# Patient Record
Sex: Male | Born: 1945
Health system: Southern US, Community
[De-identification: ages and names within clinical notes are randomized; demographics above are authoritative.]

## PROBLEM LIST (undated history)

## (undated) DIAGNOSIS — E119 Type 2 diabetes mellitus without complications: Secondary | ICD-10-CM

## (undated) DIAGNOSIS — K635 Polyp of colon: Secondary | ICD-10-CM

## (undated) DIAGNOSIS — K5730 Diverticulosis of large intestine without perforation or abscess without bleeding: Secondary | ICD-10-CM

## (undated) DIAGNOSIS — C4491 Basal cell carcinoma of skin, unspecified: Secondary | ICD-10-CM

## (undated) DIAGNOSIS — T7840XA Allergy, unspecified, initial encounter: Secondary | ICD-10-CM

## (undated) DIAGNOSIS — E785 Hyperlipidemia, unspecified: Secondary | ICD-10-CM

## (undated) DIAGNOSIS — G629 Polyneuropathy, unspecified: Secondary | ICD-10-CM

## (undated) DIAGNOSIS — I10 Essential (primary) hypertension: Secondary | ICD-10-CM

## (undated) HISTORY — DX: Diverticulosis of large intestine without perforation or abscess without bleeding: K57.30

## (undated) HISTORY — PX: APPENDECTOMY: SHX54

## (undated) HISTORY — DX: Polyneuropathy, unspecified: G62.9

## (undated) HISTORY — PX: POLYPECTOMY: SHX149

## (undated) HISTORY — DX: Type 2 diabetes mellitus without complications: E11.9

## (undated) HISTORY — DX: Essential (primary) hypertension: I10

## (undated) HISTORY — DX: Polyp of colon: K63.5

## (undated) HISTORY — DX: Basal cell carcinoma of skin, unspecified: C44.91

## (undated) HISTORY — DX: Hyperlipidemia, unspecified: E78.5

## (undated) HISTORY — DX: Allergy, unspecified, initial encounter: T78.40XA

## (undated) HISTORY — PX: COLONOSCOPY: SHX174

## (undated) NOTE — *Deleted (*Deleted)
Please stop by lab before you go If you have mychart- we will send your results within 3 business days of [place] receiving them.  If you do not have mychart- we will call you about results within 5 business days of [place] receiving them.  *please note we are currently using [facility] which has a longer processing time than [facility] typically so labs may not come back as quickly as in the past *please also note that you will see labs on mychart as soon as they post. I will later go in and write notes on them- will say "notes from Dr. Durene Cal"  Health Maintenance Due  Topic Date Due  . INFLUENZA VACCINE  07/25/2020   Depression screen [facility] 2/9 07/20/2020 06/25/2020 03/02/2020  Decreased Interest 0 0 0  Down, Depressed, Hopeless 0 0 0  PHQ - 2 Score 0 0 0  Altered sleeping 0 0 0  Tired, decreased energy 0 0 0  Change in appetite 0 0 0  Feeling bad or failure about yourself  0 0 0  Trouble concentrating 0 0 0  Moving slowly or fidgety/restless 0 0 0  Suicidal thoughts 0 0 0  PHQ-9 Score 0 0 0  Difficult doing work/chores Not difficult at all Not difficult at all Not difficult at all

---

## 1999-11-04 ENCOUNTER — Ambulatory Visit (HOSPITAL_COMMUNITY): Admission: RE | Admit: 1999-11-04 | Discharge: 1999-11-04 | Payer: Self-pay | Admitting: Gastroenterology

## 1999-11-04 ENCOUNTER — Encounter (INDEPENDENT_AMBULATORY_CARE_PROVIDER_SITE_OTHER): Payer: Self-pay | Admitting: Specialist

## 2001-07-13 ENCOUNTER — Inpatient Hospital Stay (HOSPITAL_COMMUNITY): Admission: EM | Admit: 2001-07-13 | Discharge: 2001-07-14 | Payer: Self-pay | Admitting: Emergency Medicine

## 2001-07-13 ENCOUNTER — Encounter: Payer: Self-pay | Admitting: Emergency Medicine

## 2001-07-13 ENCOUNTER — Encounter (INDEPENDENT_AMBULATORY_CARE_PROVIDER_SITE_OTHER): Payer: Self-pay

## 2002-01-15 ENCOUNTER — Encounter: Payer: Self-pay | Admitting: Internal Medicine

## 2002-02-11 ENCOUNTER — Ambulatory Visit (HOSPITAL_BASED_OUTPATIENT_CLINIC_OR_DEPARTMENT_OTHER): Admission: RE | Admit: 2002-02-11 | Discharge: 2002-02-11 | Payer: Self-pay | Admitting: Internal Medicine

## 2006-04-16 ENCOUNTER — Ambulatory Visit: Payer: Self-pay | Admitting: Internal Medicine

## 2006-04-26 ENCOUNTER — Ambulatory Visit: Payer: Self-pay | Admitting: Internal Medicine

## 2006-05-17 ENCOUNTER — Ambulatory Visit: Payer: Self-pay | Admitting: Internal Medicine

## 2006-05-31 ENCOUNTER — Ambulatory Visit: Payer: Self-pay | Admitting: Internal Medicine

## 2006-05-31 ENCOUNTER — Encounter: Payer: Self-pay | Admitting: Internal Medicine

## 2007-03-19 ENCOUNTER — Ambulatory Visit: Payer: Self-pay | Admitting: Internal Medicine

## 2007-04-23 ENCOUNTER — Ambulatory Visit: Payer: Self-pay | Admitting: Internal Medicine

## 2007-04-23 LAB — CONVERTED CEMR LAB
BUN: 21 mg/dL (ref 6–23)
CO2: 27 meq/L (ref 19–32)
Calcium: 9.2 mg/dL (ref 8.4–10.5)
Chloride: 105 meq/L (ref 96–112)
Cholesterol: 201 mg/dL (ref 0–200)
Creatinine, Ser: 1 mg/dL (ref 0.4–1.5)
Direct LDL: 127.7 mg/dL
Folate: 18 ng/mL
GFR calc Af Amer: 98 mL/min
GFR calc non Af Amer: 81 mL/min
Glucose, Bld: 105 mg/dL — ABNORMAL HIGH (ref 70–99)
HDL: 33.1 mg/dL — ABNORMAL LOW (ref >39.0)
Potassium: 4.1 meq/L (ref 3.5–5.1)
Sodium: 141 meq/L (ref 135–145)
TSH: 1.99 microintl units/mL (ref 0.35–5.50)
Total CHOL/HDL Ratio: 6.1
Triglycerides: 224 mg/dL (ref 0–149)
VLDL: 45 mg/dL — ABNORMAL HIGH (ref 0–40)

## 2007-05-14 ENCOUNTER — Ambulatory Visit: Payer: Self-pay | Admitting: Internal Medicine

## 2007-06-21 DIAGNOSIS — E785 Hyperlipidemia, unspecified: Secondary | ICD-10-CM | POA: Insufficient documentation

## 2007-06-21 DIAGNOSIS — Z86010 Personal history of colonic polyps: Secondary | ICD-10-CM | POA: Insufficient documentation

## 2007-06-21 DIAGNOSIS — E1169 Type 2 diabetes mellitus with other specified complication: Secondary | ICD-10-CM | POA: Insufficient documentation

## 2007-07-09 ENCOUNTER — Encounter: Payer: Self-pay | Admitting: Internal Medicine

## 2007-09-11 ENCOUNTER — Encounter: Payer: Self-pay | Admitting: Internal Medicine

## 2008-03-25 ENCOUNTER — Telehealth: Payer: Self-pay | Admitting: Internal Medicine

## 2008-03-26 ENCOUNTER — Ambulatory Visit: Payer: Self-pay | Admitting: Internal Medicine

## 2008-03-26 DIAGNOSIS — I10 Essential (primary) hypertension: Secondary | ICD-10-CM | POA: Insufficient documentation

## 2008-04-15 ENCOUNTER — Ambulatory Visit: Payer: Self-pay | Admitting: Internal Medicine

## 2008-05-20 ENCOUNTER — Ambulatory Visit: Payer: Self-pay | Admitting: Internal Medicine

## 2008-05-20 LAB — CONVERTED CEMR LAB
ALT: 62 units/L — ABNORMAL HIGH (ref 0–53)
AST: 44 units/L — ABNORMAL HIGH (ref 0–37)
Albumin: 4.2 g/dL (ref 3.5–5.2)
Alkaline Phosphatase: 64 units/L (ref 39–117)
BUN: 25 mg/dL — ABNORMAL HIGH (ref 6–23)
Basophils Absolute: 0 10*3/uL (ref 0.0–0.1)
Basophils Relative: 0.3 % (ref 0.0–1.0)
Bilirubin, Direct: 0.1 mg/dL (ref 0.0–0.3)
CO2: 27 meq/L (ref 19–32)
Calcium: 9.5 mg/dL (ref 8.4–10.5)
Chloride: 103 meq/L (ref 96–112)
Cholesterol: 227 mg/dL (ref 0–200)
Creatinine, Ser: 1.1 mg/dL (ref 0.4–1.5)
Direct LDL: 139.8 mg/dL
Eosinophils Absolute: 0.1 10*3/uL (ref 0.0–0.7)
Eosinophils Relative: 2.8 % (ref 0.0–5.0)
GFR calc Af Amer: 88 mL/min
GFR calc non Af Amer: 72 mL/min
Glucose, Bld: 101 mg/dL — ABNORMAL HIGH (ref 70–99)
HCT: 45.7 % (ref 39.0–52.0)
HDL: 28.8 mg/dL — ABNORMAL LOW (ref >39.0)
Hemoglobin: 15.3 g/dL (ref 13.0–17.0)
Lymphocytes Relative: 26.2 % (ref 12.0–46.0)
MCHC: 33.5 g/dL (ref 30.0–36.0)
MCV: 92.8 fL (ref 78.0–100.0)
Monocytes Absolute: 0.5 10*3/uL (ref 0.1–1.0)
Monocytes Relative: 10.3 % (ref 3.0–12.0)
Neutro Abs: 3.2 10*3/uL (ref 1.4–7.7)
Neutrophils Relative %: 60.4 % (ref 43.0–77.0)
PSA: 1.61 ng/mL (ref 0.10–4.00)
Platelets: 189 10*3/uL (ref 150–400)
Potassium: 3.7 meq/L (ref 3.5–5.1)
RBC: 4.93 M/uL (ref 4.22–5.81)
RDW: 12.9 % (ref 11.5–14.6)
Sodium: 140 meq/L (ref 135–145)
TSH: 2.25 microintl units/mL (ref 0.35–5.50)
Total Bilirubin: 1 mg/dL (ref 0.3–1.2)
Total CHOL/HDL Ratio: 7.9
Total Protein: 7.1 g/dL (ref 6.0–8.3)
Triglycerides: 245 mg/dL (ref 0–149)
VLDL: 49 mg/dL — ABNORMAL HIGH (ref 0–40)
WBC: 5.1 10*3/uL (ref 4.5–10.5)

## 2008-05-26 ENCOUNTER — Ambulatory Visit: Payer: Self-pay | Admitting: Internal Medicine

## 2008-05-26 DIAGNOSIS — K5730 Diverticulosis of large intestine without perforation or abscess without bleeding: Secondary | ICD-10-CM | POA: Insufficient documentation

## 2008-05-26 HISTORY — DX: Diverticulosis of large intestine without perforation or abscess without bleeding: K57.30

## 2008-07-27 ENCOUNTER — Ambulatory Visit: Payer: Self-pay | Admitting: Internal Medicine

## 2009-01-27 ENCOUNTER — Ambulatory Visit: Payer: Self-pay | Admitting: Internal Medicine

## 2009-01-27 DIAGNOSIS — G609 Hereditary and idiopathic neuropathy, unspecified: Secondary | ICD-10-CM | POA: Insufficient documentation

## 2009-01-27 DIAGNOSIS — R740 Nonspecific elevation of levels of transaminase and lactic acid dehydrogenase [LDH]: Secondary | ICD-10-CM

## 2009-01-27 DIAGNOSIS — R7401 Elevation of levels of liver transaminase levels: Secondary | ICD-10-CM | POA: Insufficient documentation

## 2009-01-27 LAB — CONVERTED CEMR LAB
HCV Ab: NEGATIVE
Hep B S Ab: NEGATIVE
Hepatitis B Surface Ag: NEGATIVE

## 2009-02-04 LAB — CONVERTED CEMR LAB
ALT: 69 units/L — ABNORMAL HIGH (ref 0–53)
AST: 46 units/L — ABNORMAL HIGH (ref 0–37)
Albumin: 4 g/dL (ref 3.5–5.2)
Alkaline Phosphatase: 52 units/L (ref 39–117)
BUN: 25 mg/dL — ABNORMAL HIGH (ref 6–23)
Bilirubin, Direct: 0.2 mg/dL (ref 0.0–0.3)
CO2: 30 meq/L (ref 19–32)
Calcium: 9.4 mg/dL (ref 8.4–10.5)
Chloride: 103 meq/L (ref 96–112)
Cholesterol: 193 mg/dL (ref 0–200)
Creatinine, Ser: 1.1 mg/dL (ref 0.4–1.5)
Direct LDL: 124 mg/dL
GFR calc Af Amer: 87 mL/min
GFR calc non Af Amer: 72 mL/min
Glucose, Bld: 127 mg/dL — ABNORMAL HIGH (ref 70–99)
HDL: 30.5 mg/dL — ABNORMAL LOW (ref >39.0)
Potassium: 3.6 meq/L (ref 3.5–5.1)
Sodium: 139 meq/L (ref 135–145)
TSH: 1.7 microintl units/mL (ref 0.35–5.50)
Total Bilirubin: 0.8 mg/dL (ref 0.3–1.2)
Total CHOL/HDL Ratio: 6.3
Total Protein: 6.7 g/dL (ref 6.0–8.3)
Triglycerides: 243 mg/dL (ref 0–149)
VLDL: 49 mg/dL — ABNORMAL HIGH (ref 0–40)

## 2009-07-02 ENCOUNTER — Telehealth: Payer: Self-pay | Admitting: Internal Medicine

## 2009-07-29 ENCOUNTER — Ambulatory Visit: Payer: Self-pay | Admitting: Internal Medicine

## 2009-07-29 LAB — CONVERTED CEMR LAB
ALT: 92 U/L — ABNORMAL HIGH
AST: 62 U/L — ABNORMAL HIGH
Albumin: 4.3 g/dL
Alkaline Phosphatase: 60 U/L
BUN: 28 mg/dL — ABNORMAL HIGH
Basophils Absolute: 0 K/uL
Basophils Relative: 0.2 %
Bilirubin Urine: NEGATIVE
Bilirubin, Direct: 0.2 mg/dL
Blood in Urine, dipstick: NEGATIVE
CO2: 25 meq/L
Calcium: 9.8 mg/dL
Chloride: 103 meq/L
Cholesterol: 201 mg/dL — ABNORMAL HIGH
Creatinine, Ser: 1.2 mg/dL
Direct LDL: 143.9 mg/dL
Eosinophils Absolute: 0.1 K/uL
Eosinophils Relative: 2.6 %
GFR calc non Af Amer: 65.02 mL/min
Glucose, Bld: 118 mg/dL — ABNORMAL HIGH
Glucose, Urine, Semiquant: NEGATIVE
HCT: 44 %
HDL: 31 mg/dL — ABNORMAL LOW
Hemoglobin: 15.2 g/dL
Lymphocytes Relative: 22.2 %
Lymphs Abs: 1.2 K/uL
MCHC: 34.5 g/dL
MCV: 92.6 fL
Monocytes Absolute: 0.6 K/uL
Monocytes Relative: 11.4 %
Neutro Abs: 3.6 K/uL
Neutrophils Relative %: 63.6 %
Nitrite: NEGATIVE
PSA: 1.65 ng/mL
Platelets: 173 K/uL
Potassium: 4.6 meq/L
RBC: 4.75 M/uL
RDW: 12.6 %
Sodium: 141 meq/L
Specific Gravity, Urine: >=1.03
TSH: 2.16 u[IU]/mL
Total Bilirubin: 1.1 mg/dL
Total CHOL/HDL Ratio: 6
Total Protein: 7.3 g/dL
Triglycerides: 192 mg/dL — ABNORMAL HIGH
Urobilinogen, UA: 0.2
VLDL: 38.4 mg/dL
WBC Urine, dipstick: NEGATIVE
WBC: 5.5 10*3/microliter
pH: 6

## 2009-08-05 ENCOUNTER — Ambulatory Visit: Payer: Self-pay | Admitting: Internal Medicine

## 2010-02-01 ENCOUNTER — Telehealth: Payer: Self-pay | Admitting: Internal Medicine

## 2010-02-02 ENCOUNTER — Ambulatory Visit: Payer: Self-pay | Admitting: Internal Medicine

## 2010-02-02 LAB — CONVERTED CEMR LAB
BUN: 21 mg/dL (ref 6–23)
CO2: 27 meq/L (ref 19–32)
Calcium: 9.7 mg/dL (ref 8.4–10.5)
Chloride: 101 meq/L (ref 96–112)
Creatinine, Ser: 1.1 mg/dL (ref 0.4–1.5)
GFR calc non Af Amer: 71.77 mL/min (ref >60)
Glucose, Bld: 119 mg/dL — ABNORMAL HIGH (ref 70–99)
Potassium: 3.8 meq/L (ref 3.5–5.1)
Sodium: 138 meq/L (ref 135–145)

## 2010-02-08 LAB — CONVERTED CEMR LAB

## 2010-02-15 ENCOUNTER — Ambulatory Visit: Payer: Self-pay | Admitting: Internal Medicine

## 2010-04-12 ENCOUNTER — Encounter: Payer: Self-pay | Admitting: Internal Medicine

## 2010-08-01 ENCOUNTER — Telehealth: Payer: Self-pay | Admitting: Internal Medicine

## 2010-08-02 ENCOUNTER — Ambulatory Visit: Payer: Self-pay | Admitting: Internal Medicine

## 2010-08-02 LAB — CONVERTED CEMR LAB
AST: 26 units/L (ref 0–37)
Albumin: 4.3 g/dL (ref 3.5–5.2)
Alkaline Phosphatase: 55 units/L (ref 39–117)
Basophils Relative: 0.6 % (ref 0.0–3.0)
Blood in Urine, dipstick: NEGATIVE
CO2: 26 meq/L (ref 19–32)
Calcium: 9.2 mg/dL (ref 8.4–10.5)
Chloride: 101 meq/L (ref 96–112)
Eosinophils Absolute: 0.1 10*3/uL (ref 0.0–0.7)
Glucose, Bld: 104 mg/dL — ABNORMAL HIGH (ref 70–99)
Glucose, Urine, Semiquant: NEGATIVE
HCT: 43.9 % (ref 39.0–52.0)
HDL: 34.4 mg/dL — ABNORMAL LOW (ref >39.00)
Hemoglobin: 14.9 g/dL (ref 13.0–17.0)
Lymphocytes Relative: 25 % (ref 12.0–46.0)
Lymphs Abs: 1.1 10*3/uL (ref 0.7–4.0)
MCHC: 34 g/dL (ref 30.0–36.0)
Monocytes Relative: 11.3 % (ref 3.0–12.0)
Neutro Abs: 2.7 10*3/uL (ref 1.4–7.7)
Nitrite: NEGATIVE
Potassium: 4 meq/L (ref 3.5–5.1)
RBC: 4.63 M/uL (ref 4.22–5.81)
RDW: 13.4 % (ref 11.5–14.6)
Sodium: 138 meq/L (ref 135–145)
Specific Gravity, Urine: 1.025
TSH: 2.79 microintl units/mL (ref 0.35–5.50)
Total CHOL/HDL Ratio: 6
Total Protein: 6.9 g/dL (ref 6.0–8.3)
WBC Urine, dipstick: NEGATIVE
pH: 5.5

## 2010-08-09 ENCOUNTER — Ambulatory Visit: Payer: Self-pay | Admitting: Internal Medicine

## 2011-01-02 ENCOUNTER — Telehealth: Payer: Self-pay | Admitting: Internal Medicine

## 2011-01-24 NOTE — Assessment & Plan Note (Signed)
Summary: 6 month rov/njr   Vital Signs:  Patient profile:   65 year old male Weight:      218 pounds Temp:     98 degrees F Pulse rate:   72 / minute Resp:     12 per minute BP sitting:   118 / 90  (left arm)  Vitals Entered By: Gladis Riffle, RN (February 15, 2010 9:40 AM) CC: 6 month rov, labs done Is Patient Diabetic? No   CC:  6 month rov and labs done.  History of Present Illness:  Follow-Up Visit      This is a 65 year old man who presents for Follow-up visit.  The patient denies chest pain and palpitations.  Since the last visit the patient notes no new problems or concerns.  The patient reports taking meds as prescribed.  When questioned about possible medication side effects, the patient notes none.    All other systems reviewed and were negative   Preventive Screening-Counseling & Management  Alcohol-Tobacco     Smoking Status: never  Current Problems (verified): 1)  Transaminases, Serum, Elevated  (ICD-790.4) 2)  Peripheral Neuropathy  (ICD-356.9) 3)  Preventive Health Care  (ICD-V70.0) 4)  Diverticulosis, Colon  (ICD-562.10) 5)  Essential Hypertension  (ICD-401.9) 6)  Colonic Polyps, Hx of  (ICD-V12.72) 7)  Hyperlipidemia  (ICD-272.4)  Medications Prior to Update: 1)  Aspir-81 81 Mg Tbec (Aspirin) .... Take 1 Tablet By Mouth Once A Day 2)  Valtrex 1 Gm  Tabs (Valacyclovir Hcl) .... 2 Twice Daily For One Day As Needed 3)  Lisinopril-Hydrochlorothiazide 20-12.5 Mg Tabs (Lisinopril-Hydrochlorothiazide) .Marland Kitchen.. 1 Tablet By Mouth Daily 4)  Lysine Hcl 1000 Mg  Tabs (Lysine Hcl) .... Once Daily 5)  Multivitamins  Tabs (Multiple Vitamin) .... Once Daily  Current Medications (verified): 1)  Aspir-81 81 Mg Tbec (Aspirin) .... Take 1 Tablet By Mouth Once A Day 2)  Valtrex 1 Gm  Tabs (Valacyclovir Hcl) .... 2 Twice Daily For One Day As Needed 3)  Lisinopril-Hydrochlorothiazide 20-12.5 Mg Tabs (Lisinopril-Hydrochlorothiazide) .Marland Kitchen.. 1 Tablet By Mouth Daily 4)  Lysine Hcl  1000 Mg  Tabs (Lysine Hcl) .... Once Daily 5)  Multivitamins  Tabs (Multiple Vitamin) .... Once Daily  Allergies (verified): No Known Drug Allergies  Past History:  Past Medical History: Last updated: 05/26/2008 Hyperlipidemia--refused treatment Colonic polyps, hx of Hypertension Diverticulosis, colon  Past Surgical History: Last updated: 06/21/2007 Appendectomy  Family History: Last updated: 05/26/2008 no known htn Father:  alive, CABG age 68 Mother: deceased, blood clot age 35, unknown cancerin "intestines" Siblings: 2 brothers, 1 sister healthy  Social History: Last updated: 05/26/2008 Occupation: Never Smoked Married Alcohol use-yes 2 healthy children  Risk Factors: Smoking Status: never (02/15/2010)  Physical Exam  General:  alert and well-developed.   Head:  normocephalic and atraumatic.   Eyes:  pupils equal and pupils round.   Ears:  R ear normal and L ear normal.   Neck:  No deformities, masses, or tenderness noted. Lungs:  normal respiratory effort and no intercostal retractions.   Heart:  normal rate and regular rhythm.   Abdomen:  Bowel sounds positive,abdomen soft and non-tender without masses, organomegaly or hernias noted. Msk:  No deformity or scoliosis noted of thoracic or lumbar spine.   Neurologic:  cranial nerves II-XII intact and gait normal.     Impression & Recommendations:  Problem # 1:  ESSENTIAL HYPERTENSION (ICD-401.9) reasonable control continue current medications  His updated medication list for this problem includes:  Lisinopril-hydrochlorothiazide 20-12.5 Mg Tabs (Lisinopril-hydrochlorothiazide) .Marland Kitchen... 1 tablet by mouth daily  Problem # 2:  TRANSAMINASES, SERUM, ELEVATED (ICD-790.4) had been stable will check with next lab draw  Problem # 3:  PERIPHERAL NEUROPATHY (ICD-356.9) stable sxs  Complete Medication List: 1)  Aspir-81 81 Mg Tbec (Aspirin) .... Take 1 tablet by mouth once a day 2)  Valtrex 1 Gm Tabs  (Valacyclovir hcl) .... 2 twice daily for one day as needed 3)  Lisinopril-hydrochlorothiazide 20-12.5 Mg Tabs (Lisinopril-hydrochlorothiazide) .Marland Kitchen.. 1 tablet by mouth daily 4)  Lysine Hcl 1000 Mg Tabs (Lysine hcl) .... Once daily 5)  Multivitamins Tabs (Multiple vitamin) .... Once daily  Patient Instructions: 1)  6 months CPX

## 2011-01-24 NOTE — Letter (Signed)
Summary: [facility]  [facility]   Imported By: Maryln Gottron 04/28/2010 15:24:23  _____________________________________________________________________  External Attachment:    Type:   Image     Comment:   External Document

## 2011-01-24 NOTE — Progress Notes (Signed)
Summary: pt req additional lab work to be added to sch labs on 02/02/10  Phone Note Call from Patient Call back at Work Phone [phone]   Caller: Patient Summary of Call: Pt called and is having lab work done here on 02/02/10. Pt says that his insurance company is requiring that he gets additional labs added to his bloodwork. Pt would like Dr. Cato Mulligan to order the following labs,  Basic Metabolic Profile and HIV study and UA.  Please advise.  Initial call taken by: Lucy Antigua,  February 01, 2010 9:08 AM  Follow-up for Phone Call        ok Follow-up by: Birdie Sons MD,  February 01, 2010 10:34 AM  Additional Follow-up for Phone Call Additional follow up Details #1::        I called pt and added the requested labs as noted abover per Dr. Cato Mulligan.  Additional Follow-up by: Lucy Antigua,  February 01, 2010 11:33 AM

## 2011-01-24 NOTE — Letter (Signed)
Summary: [facility]-page 2 only  [facility]-page 2 only   Imported By: Maryln Gottron 04/19/2010 15:17:18  _____________________________________________________________________  External Attachment:    Type:   Image     Comment:   External Document

## 2011-01-24 NOTE — Progress Notes (Signed)
Summary: Pt req to be tested for b-12 deficiency. Pls add to labs  Phone Note Call from Patient Call back at Home Phone [phone] Call back at Work Phone [phone]   Caller: Patient Summary of Call: Pt is sch to come in for labs on 08/02/10 and is req that he be tested for B-12 Deficiency. Pls add test to labs.      Initial call taken by: Lucy Antigua,  August 01, 2010 9:22 AM  Follow-up for Phone Call        ok Follow-up by: Birdie Sons MD,  August 01, 2010 2:43 PM  Additional Follow-up for Phone Call Additional follow up Details #1::        added to labs for 08/02/10. Additional Follow-up by: Gladis Riffle, RN,  August 01, 2010 3:33 PM

## 2011-01-24 NOTE — Assessment & Plan Note (Signed)
Summary: CPX//CCM   Vital Signs:  Patient profile:   65 year old male Height:      71 inches Weight:      207 pounds BMI:     28.97 Temp:     98.3 degrees F oral Pulse rate:   70 / minute BP sitting:   120 / 80  (left arm) Cuff size:   regular  Vitals Entered By: Kathrynn Speed CMA (August 09, 2010 9:05 AM) CC: cpx, review labs, src Is Patient Diabetic? No   CC:  cpx, review labs, and src.  History of Present Illness: CPX  Preventive Screening-Counseling & Management  Alcohol-Tobacco     Smoking Status: never  Current Problems (verified): 1)  Transaminases, Serum, Elevated  (ICD-790.4) 2)  Peripheral Neuropathy  (ICD-356.9) 3)  Preventive Health Care  (ICD-V70.0) 4)  Diverticulosis, Colon  (ICD-562.10) 5)  Essential Hypertension  (ICD-401.9) 6)  Colonic Polyps, Hx of  (ICD-V12.72) 7)  Hyperlipidemia  (ICD-272.4)  Current Medications (verified): 1)  Aspir-81 81 Mg Tbec (Aspirin) .... Take 1 Tablet By Mouth Once A Day 2)  Valtrex 1 Gm  Tabs (Valacyclovir Hcl) .... 2 Twice Daily For One Day As Needed 3)  Lisinopril-Hydrochlorothiazide 20-12.5 Mg Tabs (Lisinopril-Hydrochlorothiazide) .Marland Kitchen.. 1 Tablet By Mouth Daily 4)  Lysine Hcl 1000 Mg  Tabs (Lysine Hcl) .... Once Daily 5)  Multivitamins  Tabs (Multiple Vitamin) .... Once Daily 6)  Vitamin D  5000 Mg-Unit Tabs (Calcium Carbonate-Vitamin D) .... Once Daily 7)  Alpha-Lipoic Acid 600 Mg Caps (Alpha-Lipoic Acid) .... Once Day  Allergies (verified): No Known Drug Allergies  Past History:  Past Medical History: Last updated: 05/26/2008 Hyperlipidemia--refused treatment Colonic polyps, hx of Hypertension Diverticulosis, colon  Past Surgical History: Last updated: 06/21/2007 Appendectomy  Family History: Last updated: 05/26/2008 no known htn Father:  alive, CABG age 50 Mother: deceased, blood clot age 71, unknown cancerin "intestines" Siblings: 2 brothers, 1 sister healthy  Social History: Last updated:  05/26/2008 Occupation: Never Smoked Married Alcohol use-yes 2 healthy children  Risk Factors: Smoking Status: never (08/09/2010)  Family History: Reviewed history from 05/26/2008 and no changes required. no known htn Father:  alive, CABG age 74 Mother: deceased, blood clot age 19, unknown cancerin "intestines" Siblings: 2 brothers, 1 sister healthy  Social History: Reviewed history from 05/26/2008 and no changes required. Occupation: Never Smoked Married Alcohol use-yes 2 healthy children  Physical Exam  General:  alert and well-developed.   Head:  normocephalic and atraumatic.   Eyes:  pupils equal and pupils round.   Ears:  R ear normal and L ear normal.   Nose:  no external deformity and no external erythema.   Neck:  No deformities, masses, or tenderness noted. Chest Wall:  No deformities, masses, tenderness or gynecomastia noted. Lungs:  normal respiratory effort and no intercostal retractions.   Heart:  normal rate and regular rhythm.   Abdomen:  Bowel sounds positive,abdomen soft and non-tender without masses, organomegaly or hernias noted. Rectal:  no hemorrhoids and no masses.   Prostate:  no gland enlargement and no asymmetry.   Msk:  No deformity or scoliosis noted of thoracic or lumbar spine.   Pulses:  R radial normal and L radial normal.   Neurologic:  cranial nerves II-XII intact and gait normal.   Skin:  turgor normal and color normal.   Psych:  good eye contact and not anxious appearing.     Impression & Recommendations:  Problem # 1:  PREVENTIVE HEALTH CARE (ICD-V70.0) health  maint utd advised regular exercise  Problem # 2:  ESSENTIAL HYPERTENSION (ICD-401.9) controlled continue current medications  His updated medication list for this problem includes:    Lisinopril-hydrochlorothiazide 20-12.5 Mg Tabs (Lisinopril-hydrochlorothiazide) .Marland Kitchen... 1 tablet by mouth daily  BP today: 120/80 Prior BP: 118/90 (02/15/2010)  Labs Reviewed: K+: 4.0  (08/02/2010) Creat: : 1.1 (08/02/2010)   Chol: 195 (08/02/2010)   HDL: 34.40 (08/02/2010)   LDL: 127 (08/02/2010)   TG: 169.0 (08/02/2010)  Problem # 3:  HYPERLIPIDEMIA (ICD-272.4) controlled and not on any medications Labs Reviewed: SGOT: 26 (08/02/2010)   SGPT: 35 (08/02/2010)   HDL:34.40 (08/02/2010), 31.00 (07/29/2009)  LDL:127 (08/02/2010), DEL (01/27/2009)  Chol:195 (08/02/2010), 201 (07/29/2009)  Trig:169.0 (08/02/2010), 192.0 (07/29/2009)  Complete Medication List: 1)  Aspir-81 81 Mg Tbec (Aspirin) .... Take 1 tablet by mouth once a day 2)  Valtrex 1 Gm Tabs (Valacyclovir hcl) .... 2 twice daily for one day as needed 3)  Lisinopril-hydrochlorothiazide 20-12.5 Mg Tabs (Lisinopril-hydrochlorothiazide) .Marland Kitchen.. 1 tablet by mouth daily 4)  Lysine Hcl 1000 Mg Tabs (Lysine hcl) .... Once daily 5)  Multivitamins Tabs (Multiple vitamin) .... Once daily 6)  Vitamin D 5000 Mg-unit Tabs (calcium Carbonate-vitamin D)  .... Once daily 7)  Alpha-lipoic Acid 600 Mg Caps (Alpha-lipoic acid) .... Once day Prescriptions: LISINOPRIL-HYDROCHLOROTHIAZIDE 20-12.5 MG TABS (LISINOPRIL-HYDROCHLOROTHIAZIDE) 1 tablet by mouth daily  #90 Each x 3   Entered and Authorized by:   Birdie Sons MD   Signed by:   Birdie Sons MD on 08/09/2010   Method used:   Electronically to        [facility]  [phone]* (retail)       [address]       [place], [place]  [address]       Ph: [phone] or [phone]       Fax: [phone]   RxID:   [id number]     Preventive Care Screening  Last Tetanus Booster:    Date:  05/26/2008    Results:  Td

## 2011-01-26 NOTE — Progress Notes (Signed)
Summary: Pt req refill of Valtrex to [facility] on [address]  Phone Note Refill Request Call back at Work Phone [phone] Message from:  Patient on January 02, 2011 1:19 PM  Refills Requested: Medication #1:  VALTREX 1 GM  TABS 2 twice daily for one day as needed   Dosage confirmed as above?Dosage Confirmed  Method Requested: Telephone to [facility] on [address] Initial call taken by: Lucy Antigua,  January 02, 2011 1:18 PM  Follow-up for Phone Call        Rx called to pharmacy Follow-up by: Alfred Levins, CMA,  January 02, 2011 4:22 PM    Prescriptions: VALTREX 1 GM  TABS (VALACYCLOVIR HCL) 2 twice daily for one day as needed  #20 x 3   Entered by:   Alfred Levins, CMA   Authorized by:   Birdie Sons MD   Signed by:   Alfred Levins, CMA on 01/02/2011   Method used:   Electronically to        [facility]  [phone]* (retail)       [address]       [place], [place]  [address]       Ph: [phone] or [phone]       Fax: [phone]   RxID:   [id number]

## 2011-05-12 NOTE — Op Note (Signed)
[facility]  Patient:    William Brewer, William Brewer                      MRN: [id number] Proc. Date: 07/13/01 Adm. Date:  [id number] Disc. Date: [id number] Attending:  Vikki Ports.                           Operative Report  PREOPERATIVE DIAGNOSIS:  Acute appendicitis.  POSTOPERATIVE DIAGNOSIS:  Acute appendicitis.  PROCEDURE:  Laparoscopic appendectomy.  ANESTHESIA:  General.  SURGEON:  Vikki Ports, M.D.  ASSISTANT:  Currie Paris, M.D.  DESCRIPTION OF PROCEDURE:  The patient was taken to the operating room, placed in supine position, and after adequate anesthesia was induced using an endotracheal tube, the abdomen was prepped and draped in normal sterile fashion. Using a transverse infraumbilical incision, I dissected down the fascia. This was opened vertically. An #0 Vicryl pursestring suture was placed around the fascial defect and a Hasson trocar was placed in the abdomen. The abdomen was insufflated to 15 mmHg with carbon dioxide. Under direct visualization, a blunt 11 mm trocar was placed through a stab wound in the left lower quadrant. A separate 5 mm trocar was placed through a separate stab wound in the right upper quadrant. The gallbladder was identified and appeared acutely inflamed with minimal fluid adjacent to the appendix. The appendix was grasped and retracted anteriorly. The mesoappendix was taken down using the harmonic scalpel. The base of the appendix was divided using the GIA stapling device. The appendix was placed in the endocatch bag and removed through the umbilical port. Trocars were removed under direct visualization after the right lower quadrant had been copiously irrigated. The incisions were injected using Marcaine, the infraumbilical fascial defect was closed with the #0 Vicryl pursestring suture. The skin incisions were closed with subcuticular 4-0 Vicryl. Steri-Strips and sterile dressings were  applied. The patient tolerated the procedure well and went to PACU in good condition. DD:  07/13/01 TD:  07/15/01 Job: [id number]

## 2011-07-18 ENCOUNTER — Encounter: Payer: Self-pay | Admitting: Internal Medicine

## 2011-07-18 ENCOUNTER — Other Ambulatory Visit (INDEPENDENT_AMBULATORY_CARE_PROVIDER_SITE_OTHER): Payer: BC Managed Care – PPO

## 2011-07-18 DIAGNOSIS — Z0000 Encounter for general adult medical examination without abnormal findings: Secondary | ICD-10-CM

## 2011-07-18 LAB — BASIC METABOLIC PANEL
BUN: 28 mg/dL — ABNORMAL HIGH (ref 6–23)
CO2: 27 mEq/L (ref 19–32)
Chloride: 107 mEq/L (ref 96–112)
Creatinine, Ser: 1 mg/dL (ref 0.4–1.5)
Glucose, Bld: 112 mg/dL — ABNORMAL HIGH (ref 70–99)

## 2011-07-18 LAB — HEPATIC FUNCTION PANEL
ALT: 34 U/L (ref 0–53)
AST: 24 U/L (ref 0–37)
Albumin: 4.4 g/dL (ref 3.5–5.2)
Alkaline Phosphatase: 63 U/L (ref 39–117)

## 2011-07-18 LAB — CBC WITH DIFFERENTIAL/PLATELET
Basophils Absolute: 0 10*3/uL (ref 0.0–0.1)
Eosinophils Absolute: 0.1 10*3/uL (ref 0.0–0.7)
Lymphs Abs: 1.1 10*3/uL (ref 0.7–4.0)
MCHC: 34.2 g/dL (ref 30.0–36.0)
MCV: 94.2 fl (ref 78.0–100.0)
Monocytes Absolute: 0.5 10*3/uL (ref 0.1–1.0)
Neutrophils Relative %: 62.3 % (ref 43.0–77.0)
Platelets: 172 10*3/uL (ref 150.0–400.0)
RDW: 13.4 % (ref 11.5–14.6)
WBC: 4.7 10*3/uL (ref 4.5–10.5)

## 2011-07-18 LAB — LIPID PANEL
Cholesterol: 196 mg/dL (ref 0–200)
Triglycerides: 201 mg/dL — ABNORMAL HIGH (ref 0.0–149.0)

## 2011-07-18 LAB — POCT URINALYSIS DIPSTICK
Bilirubin, UA: NEGATIVE
Blood, UA: NEGATIVE
Glucose, UA: NEGATIVE
Nitrite, UA: NEGATIVE
Spec Grav, UA: 1.03
Urobilinogen, UA: 0.2

## 2011-07-18 LAB — TSH: TSH: 2.29 u[IU]/mL (ref 0.35–5.50)

## 2011-07-18 LAB — LDL CHOLESTEROL, DIRECT: Direct LDL: 117.1 mg/dL

## 2011-07-24 ENCOUNTER — Other Ambulatory Visit: Payer: Self-pay | Admitting: Internal Medicine

## 2011-07-25 ENCOUNTER — Encounter: Payer: Self-pay | Admitting: Internal Medicine

## 2011-07-28 ENCOUNTER — Ambulatory Visit (INDEPENDENT_AMBULATORY_CARE_PROVIDER_SITE_OTHER): Payer: BC Managed Care – PPO | Admitting: Internal Medicine

## 2011-07-28 ENCOUNTER — Encounter: Payer: Self-pay | Admitting: Internal Medicine

## 2011-07-28 DIAGNOSIS — I10 Essential (primary) hypertension: Secondary | ICD-10-CM

## 2011-07-28 DIAGNOSIS — G609 Hereditary and idiopathic neuropathy, unspecified: Secondary | ICD-10-CM

## 2011-07-28 DIAGNOSIS — Z0000 Encounter for general adult medical examination without abnormal findings: Secondary | ICD-10-CM

## 2011-07-28 NOTE — Assessment & Plan Note (Signed)
Controlled Continue meds 

## 2011-07-28 NOTE — Progress Notes (Signed)
  Subjective:    Patient ID: William Brewer, male    DOB: 1946-04-23, 65 y.o.   MRN: [id number]  HPI  cpx   Past Medical History  Diagnosis Date  . Colon polyps   . Hyperlipidemia     refused treatment  . Hypertension   . Diverticulosis of colon    Past Surgical History  Procedure Date  . Appendectomy     reports that he has never smoked. He does not have any smokeless tobacco history on file. He reports that he drinks alcohol. His drug history not on file. family history includes Cancer in his mother; Deep vein thrombosis in his mother; and Heart disease (age of onset:68) in his father.  There is no history of Hypertension. No Known Allergies   Review of Systems  patient denies chest pain, shortness of breath, orthopnea. Denies lower extremity edema, abdominal pain, change in appetite, change in bowel movements. Patient denies rashes, musculoskeletal complaints. No other specific complaints in a complete review of systems.      Objective:   Physical Exam Well-developed male in no acute distress. HEENT exam atraumatic, normocephalic, extraocular muscles are intact. Conjunctivae are pink without exudate. Neck is supple without lymphadenopathy, thyromegaly, jugular venous distention. Chest is clear to auscultation without increased work of breathing. Cardiac exam S1-S2 are regular. The PMI is normal. No significant murmurs or gallops. Abdominal exam active bowel sounds, soft, nontender. No abdominal bruits. Extremities no clubbing cyanosis or edema. Peripheral pulses are normal without bruits. Neurologic exam alert and oriented without any motor or sensory deficits. Rectal exam normal tone prostate normal size without masses or asymmetry.     Assessment & Plan:  Well visit- health maint UTD

## 2011-07-28 NOTE — Assessment & Plan Note (Signed)
progressiveneuropathy---no treatment indicated

## 2011-09-15 ENCOUNTER — Telehealth: Payer: Self-pay | Admitting: Internal Medicine

## 2011-09-15 DIAGNOSIS — G629 Polyneuropathy, unspecified: Secondary | ICD-10-CM

## 2011-09-15 NOTE — Telephone Encounter (Signed)
Pt called and said that he needs to get a referral to see Dr Lesia Sago with [facility], because pt last seen there in 2008. Peripheral Neuropathy

## 2011-09-18 NOTE — Telephone Encounter (Signed)
Referral entered  

## 2012-02-09 ENCOUNTER — Other Ambulatory Visit: Payer: Self-pay | Admitting: Internal Medicine

## 2012-04-17 ENCOUNTER — Encounter: Payer: Self-pay | Admitting: Family

## 2012-04-17 ENCOUNTER — Telehealth: Payer: Self-pay | Admitting: Internal Medicine

## 2012-04-17 ENCOUNTER — Ambulatory Visit (INDEPENDENT_AMBULATORY_CARE_PROVIDER_SITE_OTHER): Payer: BC Managed Care – PPO | Admitting: Family

## 2012-04-17 VITALS — BP 138/80 | Temp 97.7°F | Wt 213.0 lb

## 2012-04-17 DIAGNOSIS — M79643 Pain in unspecified hand: Secondary | ICD-10-CM

## 2012-04-17 DIAGNOSIS — IMO0002 Reserved for concepts with insufficient information to code with codable children: Secondary | ICD-10-CM

## 2012-04-17 DIAGNOSIS — M79609 Pain in unspecified limb: Secondary | ICD-10-CM

## 2012-04-17 MED ORDER — DOXYCYCLINE HYCLATE 100 MG PO TABS: 100.0000 mg | ORAL_TABLET | Freq: Two times a day (BID) | ORAL | Status: AC

## 2012-04-17 MED ORDER — DOXYCYCLINE HYCLATE 100 MG PO TABS: 100.0000 mg | ORAL_TABLET | Freq: Two times a day (BID) | ORAL | Status: DC

## 2012-04-17 NOTE — Telephone Encounter (Signed)
Pt called to check on status of getting doxycycline (VIBRA-TABS) 100 MG tablet  called in to [facility] on [address]. Pt went to pharmacy and nothing has been rcvd yet.

## 2012-04-17 NOTE — Progress Notes (Signed)
Subjective:    Patient ID: William Brewer, male    DOB: July 10, 1946, 66 y.o.   MRN: [id number]  HPI Comments: 66 yo white male presents with c/o right index finger infection getting progressively worse x several days. Denies injury or trauma. C/o tissue surrounding nail bed bright red and tender to touch. Pain 7/10 described as aching/throbbing somewhat relieved with OTC aleve, 4/10. Denies fever, chills, or drainage to the area.   Hand Pain       Review of Systems  Constitutional: Negative.   Respiratory: Negative.   Cardiovascular: Negative.   Skin: Positive for color change. Negative for pallor, rash and wound.       See HPI   Past Medical History  Diagnosis Date  . Colon polyps   . Hyperlipidemia     refused treatment  . Hypertension   . Diverticulosis of colon     History   Social History  . Marital Status: Married    Spouse Name: N/A    Number of Children: N/A  . Years of Education: N/A   Occupational History  . Not on file.   Social History Main Topics  . Smoking status: Never Smoker   . Smokeless tobacco: Not on file  . Alcohol Use: Yes  . Drug Use:   . Sexually Active:    Other Topics Concern  . Not on file   Social History Narrative  . No narrative on file    Past Surgical History  Procedure Date  . Appendectomy     Family History  Problem Relation Age of Onset  . Deep vein thrombosis Mother   . Cancer Mother     intestine  . Heart disease Father 53    CABG  . Hypertension Neg Hx     No Known Allergies  Current Outpatient Prescriptions on File Prior to Visit  Medication Sig Dispense Refill  . ALPHA LIPOIC ACID PO Take 1 tablet by mouth daily.        Marland Kitchen aspirin 81 MG tablet Take 81 mg by mouth daily.        . Cholecalciferol (VITAMIN D) 2000 UNITS tablet Take 2,000 Units by mouth daily.        Marland Kitchen lisinopril-hydrochlorothiazide (PRINZIDE,ZESTORETIC) 20-12.5 MG per tablet TAKE ONE TABLET BY MOUTH EVERY DAY  90 tablet  1  . Lysine HCl  1000 MG TABS Take 1 tablet by mouth daily.        . Multiple Vitamin (MULTIVITAMIN) tablet Take 1 tablet by mouth daily.        . valACYclovir (VALTREX) 1000 MG tablet Take 1,000 mg by mouth 2 (two) times daily.          BP 138/80  Temp(Src) 97.7 F (36.5 C) (Oral)  Wt 213 lb (96.616 kg)chart     Objective:   Physical Exam  Constitutional: He is oriented to person, place, and time. He appears well-developed and well-nourished. No distress.  Cardiovascular: Normal rate, regular rhythm, normal heart sounds and intact distal pulses.  Exam reveals no gallop and no friction rub.   No murmur heard. Pulmonary/Chest: Effort normal and breath sounds normal. No respiratory distress. He has no wheezes. He has no rales. He exhibits no tenderness.  Neurological: He is alert and oriented to person, place, and time.  Skin: Skin is warm and dry. No rash noted. He is not diaphoretic. There is erythema. No pallor.       Right distal metacarpal tissue bright red surrounding nailbed and  tender to touch          Assessment & Plan:  Assessment: Paronchyia Plan: Doxycycline, RTC if gets worse. Teaching handout provided on diagnosis and treatment. Opportunity for questions provided.

## 2012-04-17 NOTE — Telephone Encounter (Signed)
Rx resent.

## 2012-04-17 NOTE — Patient Instructions (Signed)
Paronychia  Paronychia is an inflammatory reaction involving the folds of the skin surrounding the fingernail. This is commonly caused by an infection in the skin around a nail. The most common cause of paronychia is frequent wetting of the hands (as seen with bartenders, food servers, nurses or others who wet their hands). This makes the skin around the fingernail susceptible to infection by bacteria (germs) or fungus. Other predisposing factors are:   Aggressive manicuring.   Nail biting.   Thumb sucking.  The most common cause is a staphylococcal (a type of germ) infection, or a fungal (Candida) infection. When caused by a germ, it usually comes on suddenly with redness, swelling, pus and is often painful. It may get under the nail and form an abscess (collection of pus), or form an abscess around the nail. If the nail itself is infected with a fungus, the treatment is usually prolonged and may require oral medicine for up to one year. Your caregiver will determine the length of time treatment is required. The paronychia caused by bacteria (germs) may largely be avoided by not pulling on hangnails or picking at cuticles. When the infection occurs at the tips of the finger it is called felon. When the cause of paronychia is from the herpes simplex virus (HSV) it is called herpetic whitlow.  TREATMENT   When an abscess is present treatment is often incision and drainage. This means that the abscess must be cut open so the pus can get out. When this is done, the following home care instructions should be followed.  HOME CARE INSTRUCTIONS    It is important to keep the affected fingers very dry. Rubber or plastic gloves over cotton gloves should be used whenever the hand must be placed in water.   Keep wound clean, dry and dressed as suggested by your caregiver between warm soaks or warm compresses.   Soak in warm water for fifteen to twenty minutes three to four times per day for bacterial infections. Fungal  infections are very difficult to treat, so often require treatment for long periods of time.   For bacterial (germ) infections take antibiotics (medicine which kill germs) as directed and finish the prescription, even if the problem appears to be solved before the medicine is gone.   Only take over-the-counter or prescription medicines for pain, discomfort, or fever as directed by your caregiver.  SEEK IMMEDIATE MEDICAL CARE IF:   You have redness, swelling, or increasing pain in the wound.   You notice pus coming from the wound.   You have a fever.   You notice a bad smell coming from the wound or dressing.  Document Released: 06/06/2001 Document Revised: 11/30/2011 Document Reviewed: 02/05/2009  ExitCare Patient Information 2012 [employer].

## 2012-04-30 ENCOUNTER — Other Ambulatory Visit: Payer: Self-pay | Admitting: Internal Medicine

## 2012-08-08 ENCOUNTER — Other Ambulatory Visit: Payer: Self-pay | Admitting: Internal Medicine

## 2012-08-22 ENCOUNTER — Other Ambulatory Visit: Payer: BC Managed Care – PPO

## 2012-09-06 ENCOUNTER — Encounter: Payer: BC Managed Care – PPO | Admitting: Internal Medicine

## 2012-09-13 ENCOUNTER — Other Ambulatory Visit (INDEPENDENT_AMBULATORY_CARE_PROVIDER_SITE_OTHER): Payer: BC Managed Care – PPO

## 2012-09-13 DIAGNOSIS — Z0000 Encounter for general adult medical examination without abnormal findings: Secondary | ICD-10-CM

## 2012-09-13 LAB — POCT URINALYSIS DIPSTICK
Blood, UA: NEGATIVE
Nitrite, UA: NEGATIVE
Protein, UA: NEGATIVE
Urobilinogen, UA: 0.2
pH, UA: 6.5

## 2012-09-13 LAB — LIPID PANEL
HDL: 34.1 mg/dL — ABNORMAL LOW (ref >39.00)
Total CHOL/HDL Ratio: 6
Triglycerides: 221 mg/dL — ABNORMAL HIGH (ref 0.0–149.0)

## 2012-09-13 LAB — HEPATIC FUNCTION PANEL
Albumin: 4.3 g/dL (ref 3.5–5.2)
Total Protein: 7.1 g/dL (ref 6.0–8.3)

## 2012-09-13 LAB — PSA: PSA: 1.79 ng/mL (ref 0.10–4.00)

## 2012-09-13 LAB — CBC WITH DIFFERENTIAL/PLATELET
Basophils Relative: 0.5 % (ref 0.0–3.0)
Eosinophils Relative: 2.8 % (ref 0.0–5.0)
Hemoglobin: 14.8 g/dL (ref 13.0–17.0)
Lymphocytes Relative: 25.8 % (ref 12.0–46.0)
MCV: 93 fl (ref 78.0–100.0)
Neutrophils Relative %: 59.9 % (ref 43.0–77.0)
RBC: 4.78 Mil/uL (ref 4.22–5.81)
WBC: 5.6 10*3/uL (ref 4.5–10.5)

## 2012-09-13 LAB — LDL CHOLESTEROL, DIRECT: Direct LDL: 126.9 mg/dL

## 2012-09-13 LAB — BASIC METABOLIC PANEL
Calcium: 9.6 mg/dL (ref 8.4–10.5)
Chloride: 100 mEq/L (ref 96–112)
Creatinine, Ser: 1 mg/dL (ref 0.4–1.5)
Sodium: 136 mEq/L (ref 135–145)

## 2012-09-20 ENCOUNTER — Encounter: Payer: Self-pay | Admitting: Internal Medicine

## 2012-09-20 ENCOUNTER — Ambulatory Visit (INDEPENDENT_AMBULATORY_CARE_PROVIDER_SITE_OTHER): Payer: BC Managed Care – PPO | Admitting: Internal Medicine

## 2012-09-20 VITALS — BP 142/90 | HR 60 | Temp 98.1°F | Ht 72.0 in | Wt 216.0 lb

## 2012-09-20 DIAGNOSIS — Z23 Encounter for immunization: Secondary | ICD-10-CM

## 2012-09-20 DIAGNOSIS — G609 Hereditary and idiopathic neuropathy, unspecified: Secondary | ICD-10-CM

## 2012-09-20 DIAGNOSIS — Z0000 Encounter for general adult medical examination without abnormal findings: Secondary | ICD-10-CM

## 2012-09-20 MED ORDER — LISINOPRIL-HYDROCHLOROTHIAZIDE 20-12.5 MG PO TABS: 1.0000 | ORAL_TABLET | Freq: Every day | ORAL | Status: DC

## 2012-09-20 NOTE — Progress Notes (Signed)
Patient ID: William Brewer, male   DOB: 05/24/1946, 66 y.o.   MRN: [id number] CPX  Past Medical History  Diagnosis Date  . Colon polyps   . Hyperlipidemia     refused treatment  . Hypertension   . Diverticulosis of colon     History   Social History  . Marital Status: Married    Spouse Name: N/A    Number of Children: N/A  . Years of Education: N/A   Occupational History  . Not on file.   Social History Main Topics  . Smoking status: Never Smoker   . Smokeless tobacco: Not on file  . Alcohol Use: Yes  . Drug Use:   . Sexually Active:    Other Topics Concern  . Not on file   Social History Narrative  . No narrative on file    Past Surgical History  Procedure Date  . Appendectomy     Family History  Problem Relation Age of Onset  . Deep vein thrombosis Mother   . Cancer Mother     intestine  . Heart disease Father 13    CABG  . Hypertension Neg Hx     No Known Allergies  Current Outpatient Prescriptions on File Prior to Visit  Medication Sig Dispense Refill  . ALPHA LIPOIC ACID PO Take 1 tablet by mouth daily.        Marland Kitchen aspirin 81 MG tablet Take 81 mg by mouth daily.        . Cholecalciferol (VITAMIN D) 2000 UNITS tablet Take 2,000 Units by mouth daily.        Marland Kitchen Lysine HCl 1000 MG TABS Take 1 tablet by mouth daily.        . Multiple Vitamin (MULTIVITAMIN) tablet Take 1 tablet by mouth daily.        . valACYclovir (VALTREX) 1000 MG tablet TAKE TWO TABLETS BY MOUTH TWICE DAILY FOR ONE DAY AS NEEDED  20 tablet  0  . DISCONTD: lisinopril-hydrochlorothiazide (PRINZIDE,ZESTORETIC) 20-12.5 MG per tablet TAKE ONE TABLET BY MOUTH EVERY DAY  30 tablet  0     patient denies chest pain, shortness of breath, orthopnea. Denies lower extremity edema, abdominal pain, change in appetite, change in bowel movements. Patient denies rashes, musculoskeletal complaints. No other specific complaints in a complete review of systems.   BP 142/90  Pulse 60  Temp 98.1 F (36.7  C) (Oral)  Ht 6' (1.829 m)  Wt 216 lb (97.977 kg)  BMI 29.29 kg/m2  Past Medical History  Diagnosis Date  . Colon polyps   . Hyperlipidemia     refused treatment  . Hypertension   . Diverticulosis of colon     History   Social History  . Marital Status: Married    Spouse Name: N/A    Number of Children: N/A  . Years of Education: N/A   Occupational History  . Not on file.   Social History Main Topics  . Smoking status: Never Smoker   . Smokeless tobacco: Not on file  . Alcohol Use: Yes  . Drug Use:   . Sexually Active:    Other Topics Concern  . Not on file   Social History Narrative  . No narrative on file    Past Surgical History  Procedure Date  . Appendectomy     Family History  Problem Relation Age of Onset  . Deep vein thrombosis Mother   . Cancer Mother     intestine  . Heart  disease Father 76    CABG  . Hypertension Neg Hx     No Known Allergies  Current Outpatient Prescriptions on File Prior to Visit  Medication Sig Dispense Refill  . ALPHA LIPOIC ACID PO Take 1 tablet by mouth daily.        Marland Kitchen aspirin 81 MG tablet Take 81 mg by mouth daily.        . Cholecalciferol (VITAMIN D) 2000 UNITS tablet Take 2,000 Units by mouth daily.        Marland Kitchen Lysine HCl 1000 MG TABS Take 1 tablet by mouth daily.        . Multiple Vitamin (MULTIVITAMIN) tablet Take 1 tablet by mouth daily.        . valACYclovir (VALTREX) 1000 MG tablet TAKE TWO TABLETS BY MOUTH TWICE DAILY FOR ONE DAY AS NEEDED  20 tablet  0  . DISCONTD: lisinopril-hydrochlorothiazide (PRINZIDE,ZESTORETIC) 20-12.5 MG per tablet TAKE ONE TABLET BY MOUTH EVERY DAY  30 tablet  0     patient denies chest pain, shortness of breath, orthopnea. Denies lower extremity edema, abdominal pain, change in appetite, change in bowel movements. Patient denies rashes, musculoskeletal complaints. No other specific complaints in a complete review of systems.   BP 142/90  Pulse 60  Temp 98.1 F (36.7 C) (Oral)   Ht 6' (1.829 m)  Wt 216 lb (97.977 kg)  BMI 29.29 kg/m2   well-developed well-nourished male in no acute distress. HEENT exam atraumatic, normocephalic, neck supple without jugular venous distention. Chest clear to auscultation cardiac exam S1-S2 are regular. Abdominal exam overweight with bowel sounds, soft and nontender. Extremities no edema. Neurologic exam is alert with a normal gait.   A/P- Well Visit Health maint utd

## 2012-09-20 NOTE — Assessment & Plan Note (Signed)
Continue regular f/u 

## 2012-09-20 NOTE — Patient Instructions (Signed)
Call your insurance company and see if they will cover shingles vaccine. If they will, call us and we will give it to you  

## 2012-09-23 ENCOUNTER — Telehealth: Payer: Self-pay | Admitting: Family Medicine

## 2012-09-23 DIAGNOSIS — Z85828 Personal history of other malignant neoplasm of skin: Secondary | ICD-10-CM | POA: Diagnosis not present

## 2012-09-23 DIAGNOSIS — D235 Other benign neoplasm of skin of trunk: Secondary | ICD-10-CM | POA: Diagnosis not present

## 2012-09-23 DIAGNOSIS — D485 Neoplasm of uncertain behavior of skin: Secondary | ICD-10-CM | POA: Diagnosis not present

## 2012-09-23 DIAGNOSIS — L821 Other seborrheic keratosis: Secondary | ICD-10-CM | POA: Diagnosis not present

## 2012-09-23 DIAGNOSIS — C44711 Basal cell carcinoma of skin of unspecified lower limb, including hip: Secondary | ICD-10-CM | POA: Diagnosis not present

## 2012-09-23 NOTE — Telephone Encounter (Signed)
[facility] Triage Call Report Triage Record Num: [id number] Operator: Trey Paula Patient Name: William Brewer Call Date & Time: 09/21/2012 5:06:46PM Patient Phone: [phone] PCP: Valetta Mole. [facility] Patient Gender: Male PCP Fax : [phone] Patient DOB: 1946/03/04 [facility] Name: [facility] Reason for Call: Caller: Whitney/Other; PCP: Birdie Sons (Adults only); CB#: [phone]; Call regarding Fever and chills, pt had shots yesterday in the office; didn't sleep well last night. At 1430 onset of lightheadedness and fever of 101.3 oral, chills. Got shingles vaccine, pneumonia vaccine and flu vaccine on 09/20/12. Dizziness when standing worse, there when laying down as well. All emergent symptoms ruled out per "Dizziness or Vertigo" protocol as well as "Flu-like Symptoms" protocol. Advised home/self care and to monitor symptoms. If symptoms persist after fever comes down, to call back or seek evaluation tonight. To take Ibuprofen as directed on bottle for fever now. Patient verbalizes understanding of instructions. Protocol(s) Used: Dizziness or Vertigo Recommended Outcome per Protocol: See Provider within 2 Weeks Reason for Outcome: All other situations Care Advice: ~ Call provider if dizziness continues or returns. Change position slowly. Sit for a couple of minutes before standing to walk. Quick position changes may cause or worsen symptoms. ~ ~ SYMPTOM / CONDITION MANAGEMENT Most adults need to drink 6-10 eight-ounce glasses (1.2-2.0 liters) of fluids per day unless previously told to limit fluid intake for other medical reasons. Limit fluids that contain caffeine, sugar or alcohol. Urine will be a very light yellow color when you drink enough fluids. ~ ~ [name] still in a dimly lit room and avoid any sudden change in position. 09/28/

## 2013-01-02 DIAGNOSIS — C44711 Basal cell carcinoma of skin of unspecified lower limb, including hip: Secondary | ICD-10-CM | POA: Diagnosis not present

## 2013-02-10 ENCOUNTER — Telehealth: Payer: Self-pay | Admitting: Internal Medicine

## 2013-02-10 MED ORDER — VALACYCLOVIR HCL 1 G PO TABS: ORAL_TABLET | ORAL | Status: DC

## 2013-02-10 NOTE — Telephone Encounter (Signed)
rx sent in electronically 

## 2013-02-10 NOTE — Telephone Encounter (Signed)
Patient called stating that he need a refill of his valtrex 1000mg  2poqd bid  prn for one day sent to [facility] on [address]. Please assist.

## 2013-06-13 ENCOUNTER — Telehealth: Payer: Self-pay | Admitting: Internal Medicine

## 2013-06-13 MED ORDER — LISINOPRIL-HYDROCHLOROTHIAZIDE 20-12.5 MG PO TABS: 1.0000 | ORAL_TABLET | Freq: Every day | ORAL | Status: DC

## 2013-06-13 NOTE — Telephone Encounter (Signed)
rx sent in electronically 

## 2013-06-13 NOTE — Telephone Encounter (Signed)
PT called to request a 3 month supply of lisinopril-hydrochlorothiazide (PRINZIDE,ZESTORETIC) 20-12.5 MG per tablet, called into [facility] on [address]. Please assist.

## 2013-07-05 DIAGNOSIS — L03039 Cellulitis of unspecified toe: Secondary | ICD-10-CM | POA: Diagnosis not present

## 2013-07-05 DIAGNOSIS — L02619 Cutaneous abscess of unspecified foot: Secondary | ICD-10-CM | POA: Diagnosis not present

## 2013-07-08 ENCOUNTER — Telehealth: Payer: Self-pay | Admitting: Internal Medicine

## 2013-07-08 NOTE — Telephone Encounter (Signed)
Ov with any provider

## 2013-07-08 NOTE — Telephone Encounter (Signed)
PT wife called and stated that the pt was seen by [facility] physicians at the urgent care, on Saturday 7/12. He was seen for a wound on toe that wont heal. They explained that he will need to see a wound specialist, but would like his primary care office to "run a few test first". The patients wife is calling to inquire on what they should do now. Should I bring him in for a post urgent care? Please assist.

## 2013-07-09 NOTE — Telephone Encounter (Signed)
Scheduled

## 2013-07-16 ENCOUNTER — Encounter: Payer: Self-pay | Admitting: Family Medicine

## 2013-07-16 ENCOUNTER — Ambulatory Visit (INDEPENDENT_AMBULATORY_CARE_PROVIDER_SITE_OTHER): Payer: Medicare Other | Admitting: Family Medicine

## 2013-07-16 VITALS — BP 132/76 | HR 84 | Temp 98.0°F | Wt 206.0 lb

## 2013-07-16 DIAGNOSIS — L97509 Non-pressure chronic ulcer of other part of unspecified foot with unspecified severity: Secondary | ICD-10-CM

## 2013-07-16 DIAGNOSIS — G609 Hereditary and idiopathic neuropathy, unspecified: Secondary | ICD-10-CM | POA: Diagnosis not present

## 2013-07-16 DIAGNOSIS — L97529 Non-pressure chronic ulcer of other part of left foot with unspecified severity: Secondary | ICD-10-CM

## 2013-07-16 MED ORDER — DOXYCYCLINE HYCLATE 100 MG PO TABS: 100.0000 mg | ORAL_TABLET | Freq: Two times a day (BID) | ORAL | Status: DC

## 2013-07-16 NOTE — Patient Instructions (Addendum)
We will call you with appointment to wound care center. Follow up promptly for any fever or increased redness or swelling.

## 2013-07-16 NOTE — Progress Notes (Signed)
  Subjective:    Patient ID: William Brewer, male    DOB: 09/17/1946, 67 y.o.   MRN: [id number]  HPI Acute visit. Neuropathic ulcer left fourth toe plantar aspect Patient has idiopathic neuropathy. No history of diabetes. Nonsmoker and no PVD. He describes about 3 weeks ago onset of redness and swelling left fourth toe. No recollection of injury. Very little sensory function of feet.  Went to urgent care Center about 10 days ago placed on Keflex. Little if any improvement with Keflex. No fevers or chills. He reports culture was done which was negative. No x-rays performed. Thickened callus surrounding this ulcer. No prior history of ulcers  Past Medical History  Diagnosis Date  . Colon polyps   . Hyperlipidemia     refused treatment  . Hypertension   . Diverticulosis of colon    Past Surgical History  Procedure Laterality Date  . Appendectomy      reports that he has never smoked. He does not have any smokeless tobacco history on file. He reports that  drinks alcohol. His drug history is not on file. family history includes Cancer in his mother; Deep vein thrombosis in his mother; and Heart disease (age of onset: 20) in his father.  There is no history of Hypertension. No Known Allergies    Review of Systems  Constitutional: Negative for fever, chills and fatigue.  Respiratory: Negative for cough and shortness of breath.        Objective:   Physical Exam  Constitutional: He appears well-developed and well-nourished.  Cardiovascular: Normal rate and regular rhythm.   Pulmonary/Chest: Effort normal and breath sounds normal. No respiratory distress. He has no wheezes. He has no rales.  Musculoskeletal:  Patient has excellent pulses left foot with warm to touch and good capillary refill nontender  Skin:  Patient has ulcer ventral aspect left fourth digit. He has some diffuse edema redness involving fourth toe. No purulent drainage. No foul odor He has very thickened callus  tissue surrounding this ulcer          Assessment & Plan:  Neuropathic ulcer left fourth toe with probable cellulitis changes. Cannot rule out osteomyelitis. Obtain plain x-rays. May need MRI to further assess. Start doxycycline 100 mg twice daily for 10 days. Patient needs debridement of surrounding thickened callus and nonviable tissue. Set up referral wound care center

## 2013-07-22 NOTE — Addendum Note (Signed)
Addended by: Kristian Covey on: 07/22/2013 02:28 PM   Modules accepted: Orders

## 2013-07-28 DIAGNOSIS — L97509 Non-pressure chronic ulcer of other part of unspecified foot with unspecified severity: Secondary | ICD-10-CM | POA: Diagnosis not present

## 2013-08-04 DIAGNOSIS — L97509 Non-pressure chronic ulcer of other part of unspecified foot with unspecified severity: Secondary | ICD-10-CM | POA: Diagnosis not present

## 2013-08-18 DIAGNOSIS — L97509 Non-pressure chronic ulcer of other part of unspecified foot with unspecified severity: Secondary | ICD-10-CM | POA: Diagnosis not present

## 2013-09-08 DIAGNOSIS — L97509 Non-pressure chronic ulcer of other part of unspecified foot with unspecified severity: Secondary | ICD-10-CM | POA: Diagnosis not present

## 2013-09-22 DIAGNOSIS — L821 Other seborrheic keratosis: Secondary | ICD-10-CM | POA: Diagnosis not present

## 2013-09-22 DIAGNOSIS — L98499 Non-pressure chronic ulcer of skin of other sites with unspecified severity: Secondary | ICD-10-CM | POA: Diagnosis not present

## 2013-09-22 DIAGNOSIS — L723 Sebaceous cyst: Secondary | ICD-10-CM | POA: Diagnosis not present

## 2013-09-22 DIAGNOSIS — Z85828 Personal history of other malignant neoplasm of skin: Secondary | ICD-10-CM | POA: Diagnosis not present

## 2013-09-23 ENCOUNTER — Ambulatory Visit (INDEPENDENT_AMBULATORY_CARE_PROVIDER_SITE_OTHER): Payer: Medicare Other | Admitting: Internal Medicine

## 2013-09-23 ENCOUNTER — Encounter: Payer: Self-pay | Admitting: Internal Medicine

## 2013-09-23 VITALS — BP 110/74 | HR 76 | Temp 97.6°F | Ht 71.5 in | Wt 211.0 lb

## 2013-09-23 DIAGNOSIS — R7402 Elevation of levels of lactic acid dehydrogenase (LDH): Secondary | ICD-10-CM

## 2013-09-23 DIAGNOSIS — I10 Essential (primary) hypertension: Secondary | ICD-10-CM

## 2013-09-23 DIAGNOSIS — E785 Hyperlipidemia, unspecified: Secondary | ICD-10-CM | POA: Diagnosis not present

## 2013-09-23 DIAGNOSIS — G609 Hereditary and idiopathic neuropathy, unspecified: Secondary | ICD-10-CM

## 2013-09-23 DIAGNOSIS — R7401 Elevation of levels of liver transaminase levels: Secondary | ICD-10-CM

## 2013-09-23 DIAGNOSIS — Z23 Encounter for immunization: Secondary | ICD-10-CM

## 2013-09-23 DIAGNOSIS — R7309 Other abnormal glucose: Secondary | ICD-10-CM

## 2013-09-23 DIAGNOSIS — R739 Hyperglycemia, unspecified: Secondary | ICD-10-CM

## 2013-09-23 DIAGNOSIS — E119 Type 2 diabetes mellitus without complications: Secondary | ICD-10-CM | POA: Insufficient documentation

## 2013-09-23 LAB — CBC WITH DIFFERENTIAL/PLATELET
Basophils Relative: 0.6 % (ref 0.0–3.0)
Eosinophils Absolute: 0.2 10*3/uL (ref 0.0–0.7)
Hemoglobin: 14.9 g/dL (ref 13.0–17.0)
MCHC: 34 g/dL (ref 30.0–36.0)
MCV: 91.9 fl (ref 78.0–100.0)
Monocytes Absolute: 0.5 10*3/uL (ref 0.1–1.0)
Neutro Abs: 2.8 10*3/uL (ref 1.4–7.7)
Neutrophils Relative %: 56.9 % (ref 43.0–77.0)
RBC: 4.79 Mil/uL (ref 4.22–5.81)
RDW: 13.9 % (ref 11.5–14.6)

## 2013-09-23 LAB — BASIC METABOLIC PANEL
BUN: 23 mg/dL (ref 6–23)
Calcium: 9.3 mg/dL (ref 8.4–10.5)
GFR: 77.43 mL/min (ref >60.00)
Glucose, Bld: 114 mg/dL — ABNORMAL HIGH (ref 70–99)
Potassium: 4.2 mEq/L (ref 3.5–5.1)

## 2013-09-23 LAB — HEMOGLOBIN A1C: Hgb A1c MFr Bld: 6.6 % — ABNORMAL HIGH (ref 4.6–6.5)

## 2013-09-23 LAB — HEPATIC FUNCTION PANEL
AST: 30 U/L (ref 0–37)
Albumin: 4.2 g/dL (ref 3.5–5.2)
Total Bilirubin: 0.7 mg/dL (ref 0.3–1.2)

## 2013-09-23 LAB — LIPID PANEL
Triglycerides: 333 mg/dL — ABNORMAL HIGH (ref 0.0–149.0)
VLDL: 66.6 mg/dL — ABNORMAL HIGH (ref 0.0–40.0)

## 2013-09-23 LAB — LDL CHOLESTEROL, DIRECT: Direct LDL: 127 mg/dL

## 2013-09-23 MED ORDER — ZOLPIDEM TARTRATE 5 MG PO TABS: 5.0000 mg | ORAL_TABLET | Freq: Every evening | ORAL | Status: DC | PRN

## 2013-09-23 NOTE — Progress Notes (Signed)
htn- tolerating meds Lipids- currently not treated- needs f/u Peripheral neuropathy- idiopathic Elevated liver tests- needs f/u  He otherwise feels well.  Past Medical History  Diagnosis Date  . Colon polyps   . Hyperlipidemia     refused treatment  . Hypertension   . Diverticulosis of colon    Past Surgical History  Procedure Laterality Date  . Appendectomy      reports that he has never smoked. He does not have any smokeless tobacco history on file. He reports that  drinks alcohol. His drug history is not on file. family history includes Cancer in his mother; Deep vein thrombosis in his mother; Heart disease (age of onset: 92) in his father. There is no history of Hypertension. No Known Allergies    patient denies chest pain, shortness of breath, orthopnea. Denies lower extremity edema, abdominal pain, change in appetite, change in bowel movements. Patient denies rashes, musculoskeletal complaints. No other specific complaints in a complete review of systems.    Reviewed vitals  well-developed well-nourished male in no acute distress. HEENT exam atraumatic, normocephalic, neck supple without jugular venous distention. Chest clear to auscultation cardiac exam S1-S2 are regular. Abdominal exam overweight with bowel sounds, soft and nontender. Extremities no edema. Neurologic exam is alert with a normal gait.

## 2013-09-25 ENCOUNTER — Encounter: Payer: Self-pay | Admitting: *Deleted

## 2013-09-25 DIAGNOSIS — L97509 Non-pressure chronic ulcer of other part of unspecified foot with unspecified severity: Secondary | ICD-10-CM | POA: Diagnosis not present

## 2013-09-25 NOTE — Assessment & Plan Note (Signed)
Unclear etiology. Patient does have hyperglycemia but does not have frank diabetes. I've asked him to look at his feet daily. He does have an ulcer on one of his third toes. That's been monitored by orthopedics and dermatology.

## 2013-09-25 NOTE — Assessment & Plan Note (Signed)
Check A1c today.

## 2013-09-25 NOTE — Assessment & Plan Note (Signed)
Currently on no medications. Previous control is adequate. We'll recheck today.

## 2013-09-25 NOTE — Assessment & Plan Note (Signed)
BP Readings from Last 3 Encounters:  09/23/13 110/74  07/16/13 132/76  09/20/12 142/90   Adequate control. Continue same medications.

## 2013-10-09 DIAGNOSIS — L97509 Non-pressure chronic ulcer of other part of unspecified foot with unspecified severity: Secondary | ICD-10-CM | POA: Diagnosis not present

## 2013-10-15 ENCOUNTER — Other Ambulatory Visit: Payer: Self-pay | Admitting: Internal Medicine

## 2013-11-06 DIAGNOSIS — L97509 Non-pressure chronic ulcer of other part of unspecified foot with unspecified severity: Secondary | ICD-10-CM | POA: Diagnosis not present

## 2013-11-06 DIAGNOSIS — IMO0002 Reserved for concepts with insufficient information to code with codable children: Secondary | ICD-10-CM | POA: Diagnosis not present

## 2013-11-06 DIAGNOSIS — M24673 Ankylosis, unspecified ankle: Secondary | ICD-10-CM | POA: Diagnosis not present

## 2014-01-19 ENCOUNTER — Other Ambulatory Visit: Payer: Self-pay | Admitting: Internal Medicine

## 2014-01-23 ENCOUNTER — Encounter: Payer: Self-pay | Admitting: *Deleted

## 2014-01-26 ENCOUNTER — Other Ambulatory Visit (INDEPENDENT_AMBULATORY_CARE_PROVIDER_SITE_OTHER): Payer: Medicare Other

## 2014-01-26 ENCOUNTER — Encounter: Payer: Self-pay | Admitting: Internal Medicine

## 2014-01-26 DIAGNOSIS — E785 Hyperlipidemia, unspecified: Secondary | ICD-10-CM

## 2014-01-26 DIAGNOSIS — R7309 Other abnormal glucose: Secondary | ICD-10-CM

## 2014-01-26 DIAGNOSIS — Z79899 Other long term (current) drug therapy: Secondary | ICD-10-CM | POA: Diagnosis not present

## 2014-01-26 LAB — HEPATIC FUNCTION PANEL
ALBUMIN: 4.2 g/dL (ref 3.5–5.2)
ALK PHOS: 51 U/L (ref 39–117)
ALT: 36 U/L (ref 0–53)
AST: 24 U/L (ref 0–37)
BILIRUBIN DIRECT: 0 mg/dL (ref 0.0–0.3)
Total Bilirubin: 0.7 mg/dL (ref 0.3–1.2)
Total Protein: 7.4 g/dL (ref 6.0–8.3)

## 2014-01-26 LAB — LIPID PANEL
CHOL/HDL RATIO: 6
Cholesterol: 184 mg/dL (ref 0–200)
HDL: 31.2 mg/dL — ABNORMAL LOW (ref >39.00)
LDL Cholesterol: 113 mg/dL — ABNORMAL HIGH (ref 0–99)
TRIGLYCERIDES: 200 mg/dL — AB (ref 0.0–149.0)
VLDL: 40 mg/dL (ref 0.0–40.0)

## 2014-01-26 LAB — HEMOGLOBIN A1C: Hgb A1c MFr Bld: 6.6 % — ABNORMAL HIGH (ref 4.6–6.5)

## 2014-03-17 ENCOUNTER — Other Ambulatory Visit: Payer: Self-pay | Admitting: Internal Medicine

## 2014-03-19 ENCOUNTER — Encounter: Payer: Self-pay | Admitting: Internal Medicine

## 2014-04-15 DIAGNOSIS — Z85828 Personal history of other malignant neoplasm of skin: Secondary | ICD-10-CM | POA: Diagnosis not present

## 2014-04-15 DIAGNOSIS — L570 Actinic keratosis: Secondary | ICD-10-CM | POA: Diagnosis not present

## 2014-04-19 ENCOUNTER — Other Ambulatory Visit: Payer: Self-pay | Admitting: Internal Medicine

## 2014-07-23 ENCOUNTER — Telehealth: Payer: Self-pay | Admitting: Internal Medicine

## 2014-07-23 MED ORDER — LISINOPRIL-HYDROCHLOROTHIAZIDE 20-12.5 MG PO TABS: ORAL_TABLET | ORAL | Status: DC

## 2014-07-23 NOTE — Telephone Encounter (Signed)
[facility] [place] [place] is requesting 90 day re-fill on lisinopril-hydrochlorothiazide (PRINZIDE,ZESTORETIC) 20-12.5 MG per tablet

## 2014-07-23 NOTE — Telephone Encounter (Signed)
rx sent in electronically 

## 2014-08-25 ENCOUNTER — Telehealth: Payer: Self-pay | Admitting: Internal Medicine

## 2014-08-25 NOTE — Telephone Encounter (Signed)
lmovm to call back and est with hunter

## 2014-09-23 DIAGNOSIS — L821 Other seborrheic keratosis: Secondary | ICD-10-CM | POA: Diagnosis not present

## 2014-09-23 DIAGNOSIS — Z85828 Personal history of other malignant neoplasm of skin: Secondary | ICD-10-CM | POA: Diagnosis not present

## 2014-09-23 DIAGNOSIS — D1801 Hemangioma of skin and subcutaneous tissue: Secondary | ICD-10-CM | POA: Diagnosis not present

## 2014-09-25 ENCOUNTER — Encounter: Payer: Medicare Other | Admitting: Internal Medicine

## 2014-10-22 ENCOUNTER — Other Ambulatory Visit: Payer: Self-pay | Admitting: Internal Medicine

## 2014-11-27 ENCOUNTER — Encounter: Payer: Self-pay | Admitting: Family Medicine

## 2014-11-27 ENCOUNTER — Ambulatory Visit (INDEPENDENT_AMBULATORY_CARE_PROVIDER_SITE_OTHER): Payer: Medicare Other

## 2014-11-27 ENCOUNTER — Ambulatory Visit (INDEPENDENT_AMBULATORY_CARE_PROVIDER_SITE_OTHER): Payer: Medicare Other | Admitting: Family Medicine

## 2014-11-27 VITALS — BP 112/68 | HR 96 | Temp 97.5°F | Wt 209.0 lb

## 2014-11-27 DIAGNOSIS — E785 Hyperlipidemia, unspecified: Secondary | ICD-10-CM | POA: Diagnosis not present

## 2014-11-27 DIAGNOSIS — I10 Essential (primary) hypertension: Secondary | ICD-10-CM | POA: Diagnosis not present

## 2014-11-27 DIAGNOSIS — E119 Type 2 diabetes mellitus without complications: Secondary | ICD-10-CM | POA: Diagnosis not present

## 2014-11-27 DIAGNOSIS — Z23 Encounter for immunization: Secondary | ICD-10-CM

## 2014-11-27 NOTE — Assessment & Plan Note (Addendum)
New diagnosis based on 2 subsequent A1c is 6.6. He is currently diet and exercise controlled. Patient knows he needs to work on weight and he was to follow-up in 6 months for a repeat A1c and discussing DM health maintainence. We will check another A1c at this time as well to see how he is doing.Patient is going to increase exercise/watch foods and set weight goal of 190.

## 2014-11-27 NOTE — Progress Notes (Signed)
William Reddish, MD Phone: [phone]  Subjective:  Patient presents today to establish care with me as their new primary care provider. Patient was formerly a patient of Dr. Leanne Chang. Chief complaint-noted.   Hypertension-good control BP Readings from Last 3 Encounters:  11/27/14 112/68  09/23/13 110/74  07/16/13 132/76  Home BP monitoring-no Compliant with medications-yes without side effects ROS-Denies any CP, HA, SOB, blurry vision, LE edema.   DIABETES Type II-diet and exercise controlled previously  Lab Results  Component Value Date   HGBA1C 6.6* 01/26/2014   HGBA1C 6.6* 09/23/2013  Medications taking and tolerating-none, diet exercise controlled Blood Sugars per patient-does not check Diet- overeats Regular Exercise-walking 25 minutes 5 days a week.  On Aspirin-yes On statin-refuses Daily foot monitoring-no, need to advise.  ROS- Denies Polyuria,Polydipsia, nocturia, Vision changes, feet or hand numbness/pain/tingling. Denies Hypoglycemia symptoms (shaky, sweaty, hungry, weak anxious, tremor, palpitations, confusion, behavior change).   Hyperlipidemia-poor control given diabetes and age  Lab Results  Component Value Date   LDLCALC 113* 01/26/2014  On statin: no Regular exercise: yes see above ROS- no chest pain or shortness of breath. No myalgias  The following were reviewed and entered/updated in epic: Past Medical History  Diagnosis Date  . Colon polyps   . Hyperlipidemia     refused treatment  . Hypertension   . DIVERTICULOSIS, COLON 05/26/2008   Patient Active Problem List   Diagnosis Date Noted  . Diabetes mellitus type II, controlled 09/23/2013    Priority: High  . Idiopathic peripheral neuropathy 01/27/2009    Priority: High  . Essential hypertension 03/26/2008    Priority: Medium  . Hyperlipidemia 06/21/2007    Priority: Medium  . History of colonic polyps 06/21/2007    Priority: Low   Past Surgical History  Procedure Laterality Date  .  Appendectomy      Family History  Problem Relation Age of Onset  . Deep vein thrombosis Mother     cancer  . Cancer Mother     intestine  . Heart disease Father 38    CABG, former smoker but not heavy  . Hypertension Neg Hx     Medications- reviewed and updated Current Outpatient Prescriptions  Medication Sig Dispense Refill  . ALPHA LIPOIC ACID PO Take 1 tablet by mouth daily.      Marland Kitchen aspirin 81 MG tablet Take 81 mg by mouth daily.      . Cholecalciferol (VITAMIN D) 2000 UNITS tablet Take 2,000 Units by mouth daily.      Marland Kitchen lisinopril-hydrochlorothiazide (PRINZIDE,ZESTORETIC) 20-12.5 MG per tablet TAKE ONE TABLET BY MOUTH ONCE DAILY 90 tablet 0  . Lysine HCl 1000 MG TABS Take 1 tablet by mouth daily.      . Multiple Vitamin (MULTIVITAMIN) tablet Take 1 tablet by mouth daily.      . mupirocin ointment (BACTROBAN) 2 % Apply 1 application topically daily.    . valACYclovir (VALTREX) 1000 MG tablet TAKE TWO TABLETS BY MOUTH TWICE DAILY FOR 1 DAY AS NEEDED. (Patient not taking: Reported on 11/27/2014) 20 tablet 3  . zolpidem (AMBIEN) 5 MG tablet Take 1 tablet (5 mg total) by mouth at bedtime as needed for sleep. (Patient not taking: Reported on 11/27/2014) 10 tablet 0   No current facility-administered medications for this visit.    Allergies-reviewed and updated No Known Allergies  History   Social History  . Marital Status: Married    Spouse Name: N/A    Number of Children: N/A  . Years of Education:  N/A   Social History Main Topics  . Smoking status: Never Smoker   . Smokeless tobacco: None  . Alcohol Use: 1.2 oz/week    2 Not specified per week  . Drug Use: No  . Sexual Activity: None   Other Topics Concern  . None   Social History Narrative   Married 1973. 2 children. 2 grandkids. Son in [place]. Daughter 5 minutes away.       Retired Worked form Administrator, arts: support group for peripheral neuropathy, chess, words with  friends, active with [employer] college (board of advisors)    ROS--See HPI   Objective: BP 112/68 mmHg  Pulse 96  Temp(Src) 97.5 F (36.4 C)  Wt 209 lb (94.802 kg) Gen: NAD, resting comfortably in chair CV: RRR no murmurs rubs or gallops Lungs: CTAB no crackles, wheeze, rhonchi Abdomen: soft/nontender/nondistended/normal bowel sounds. No rebound or guarding.  Ext: no edema, decreased sensation to gross touch below mid shin and in hands.  Skin: warm, dry, no rash  Neuro: grossly normal, moves all extremities, PERRLA   Assessment/Plan:  Essential hypertension Good control on lisinopril- hydrochlorothiazide 20-12.5 mg. Continue  Diabetes mellitus type II, controlled New diagnosis based on 2 subsequent A1c is 6.6. He is currently diet and exercise controlled. Patient knows he needs to work on weight and he was to follow-up in 6 months for a repeat A1c and discussing DM health maintainence. We will check another A1c at this time as well to see how he is doing.Patient is going to increase exercise/watch foods and set weight goal of 190.   Hyperlipidemia Counseled patient on benefits and risks of statin. He declines at this time despite my strong recommendation given his age and diabetes as well as family history cardiac disease.   Return precautions advised and patient asks for 6 month follow up.   Fasting future labs Orders Placed This Encounter  Procedures  . CBC    [place]    Standing Status: Future     Number of Occurrences:      Standing Expiration Date: 11/28/2015  . Comprehensive metabolic panel    [place]    Standing Status: Future     Number of Occurrences:      Standing Expiration Date: 11/28/2015    Order Specific Question:  Has the patient fasted?    Answer:  No  . Lipid panel    [place]    Standing Status: Future     Number of Occurrences:      Standing Expiration Date: 11/28/2015    Order Specific Question:  Has the patient fasted?    Answer:  No  .  Hemoglobin A1c    [place]    Standing Status: Future     Number of Occurrences:      Standing Expiration Date: 11/28/2015  . TSH    [place]    Standing Status: Future     Number of Occurrences:      Standing Expiration Date: 11/28/2015

## 2014-11-27 NOTE — Assessment & Plan Note (Signed)
Counseled patient on benefits and risks of statin. He declines at this time despite my strong recommendation given his age and diabetes as well as family history cardiac disease.

## 2014-11-27 NOTE — Patient Instructions (Addendum)
You do technically have diabetes but it is diet/exericse controlled. You need to stick with the exercise and your goal weight of around 190.   I would recommend a cholesterol medicine for you but you declined at this time.   Thanks for letting me use you as a resource for support group for peripheral neuropathy. [phone]

## 2014-11-27 NOTE — Assessment & Plan Note (Signed)
Good control on lisinopril- hydrochlorothiazide 20-12.5 mg. Continue

## 2014-11-30 ENCOUNTER — Ambulatory Visit: Payer: Medicare Other | Admitting: Family Medicine

## 2014-11-30 ENCOUNTER — Other Ambulatory Visit (INDEPENDENT_AMBULATORY_CARE_PROVIDER_SITE_OTHER): Payer: Medicare Other

## 2014-11-30 DIAGNOSIS — E785 Hyperlipidemia, unspecified: Secondary | ICD-10-CM

## 2014-11-30 DIAGNOSIS — E119 Type 2 diabetes mellitus without complications: Secondary | ICD-10-CM

## 2014-11-30 DIAGNOSIS — I10 Essential (primary) hypertension: Secondary | ICD-10-CM

## 2014-11-30 LAB — COMPREHENSIVE METABOLIC PANEL
ALBUMIN: 4.1 g/dL (ref 3.5–5.2)
ALT: 52 U/L (ref 0–53)
AST: 33 U/L (ref 0–37)
Alkaline Phosphatase: 59 U/L (ref 39–117)
BUN: 20 mg/dL (ref 6–23)
CALCIUM: 9.4 mg/dL (ref 8.4–10.5)
CHLORIDE: 103 meq/L (ref 96–112)
CO2: 27 mEq/L (ref 19–32)
Creatinine, Ser: 1 mg/dL (ref 0.4–1.5)
GFR: 79.85 mL/min (ref >60.00)
Glucose, Bld: 123 mg/dL — ABNORMAL HIGH (ref 70–99)
Potassium: 4.1 mEq/L (ref 3.5–5.1)
SODIUM: 138 meq/L (ref 135–145)
Total Bilirubin: 0.7 mg/dL (ref 0.2–1.2)
Total Protein: 6.9 g/dL (ref 6.0–8.3)

## 2014-11-30 LAB — CBC
HCT: 42.8 % (ref 39.0–52.0)
Hemoglobin: 14.1 g/dL (ref 13.0–17.0)
MCHC: 32.8 g/dL (ref 30.0–36.0)
MCV: 92.6 fl (ref 78.0–100.0)
Platelets: 185 10*3/uL (ref 150.0–400.0)
RBC: 4.63 Mil/uL (ref 4.22–5.81)
RDW: 13.3 % (ref 11.5–15.5)
WBC: 5.8 10*3/uL (ref 4.0–10.5)

## 2014-11-30 LAB — TSH: TSH: 2.13 u[IU]/mL (ref 0.35–4.50)

## 2014-11-30 LAB — LIPID PANEL
Cholesterol: 211 mg/dL — ABNORMAL HIGH (ref 0–200)
HDL: 29.5 mg/dL — AB (ref >39.00)
NONHDL: 181.5
Total CHOL/HDL Ratio: 7
Triglycerides: 313 mg/dL — ABNORMAL HIGH (ref 0.0–149.0)
VLDL: 62.6 mg/dL — ABNORMAL HIGH (ref 0.0–40.0)

## 2014-11-30 LAB — HEMOGLOBIN A1C: HEMOGLOBIN A1C: 7.8 % — AB (ref 4.6–6.5)

## 2014-11-30 LAB — LDL CHOLESTEROL, DIRECT: LDL DIRECT: 120.6 mg/dL

## 2014-12-23 ENCOUNTER — Encounter: Payer: Self-pay | Admitting: Family Medicine

## 2014-12-24 ENCOUNTER — Encounter: Payer: Self-pay | Admitting: Family Medicine

## 2014-12-24 ENCOUNTER — Ambulatory Visit (INDEPENDENT_AMBULATORY_CARE_PROVIDER_SITE_OTHER): Payer: Medicare Other | Admitting: Family Medicine

## 2014-12-24 VITALS — BP 122/72 | HR 84 | Temp 97.9°F | Ht 71.5 in | Wt 207.9 lb

## 2014-12-24 DIAGNOSIS — J069 Acute upper respiratory infection, unspecified: Secondary | ICD-10-CM | POA: Diagnosis not present

## 2014-12-24 NOTE — Patient Instructions (Signed)
INSTRUCTIONS FOR UPPER RESPIRATORY INFECTION:  -plenty of rest and fluids  -flonase daily for 21 days  -nasal saline wash 2-3 times daily (use prepackaged nasal saline or bottled/distilled water if making your own)   -can use afrin nasal spray for drainage and nasal congestion - but do NOT use longer then 3-4 days  -can use tylenol or ibuprofen as directed for aches and sorethroat  -in the winter time, using a humidifier at night is helpful (please follow cleaning instructions)  -if you are taking a cough medication - use only as directed, may also try a teaspoon of honey to coat the throat and throat lozenges  -for sore throat, salt water gargles can help  -follow up if you have fevers, facial pain, tooth pain, difficulty breathing or are worsening or not getting better in 5-7 days

## 2014-12-24 NOTE — Progress Notes (Signed)
HPI:  Acute visit for:  Resp Illness: -started 4-5 days ago -symptoms: cough, mild SOB, PND, nasal congestion, fatigue, night sweats several days ago, sub fever a few days ago, mild nausea initially - most symptoms resolved but persistent PND, cough -denies: hemoptysis, fever, sig SOB, wheezing, sinus pain, tooth pain, recent travel, exposure to flu or tick bite -hx of allergies  ROS: See pertinent positives and negatives per HPI.  Past Medical History  Diagnosis Date  . Colon polyps   . Hyperlipidemia     refused treatment  . Hypertension   . DIVERTICULOSIS, COLON 05/26/2008    Past Surgical History  Procedure Laterality Date  . Appendectomy      Family History  Problem Relation Age of Onset  . Deep vein thrombosis Mother     cancer  . Cancer Mother     intestine  . Heart disease Father 30    CABG, former smoker but not heavy  . Hypertension Neg Hx     History   Social History  . Marital Status: Married    Spouse Name: N/A    Number of Children: N/A  . Years of Education: N/A   Social History Main Topics  . Smoking status: Never Smoker   . Smokeless tobacco: None  . Alcohol Use: 1.2 oz/week    2 Not specified per week  . Drug Use: No  . Sexual Activity: None   Other Topics Concern  . None   Social History Narrative   Married 1973. 2 children. 2 grandkids. Son in [place]. Daughter 5 minutes away.       Retired Worked form Administrator, arts: support group for peripheral neuropathy, chess, words with friends, active with [employer] college (board of advisors)    Current outpatient prescriptions: ALPHA LIPOIC ACID PO, Take 1 tablet by mouth daily.  , Disp: , Rfl: ;  aspirin 81 MG tablet, Take 81 mg by mouth daily.  , Disp: , Rfl: ;  Cholecalciferol (VITAMIN D) 2000 UNITS tablet, Take 2,000 Units by mouth daily.  , Disp: , Rfl: ;  lisinopril-hydrochlorothiazide (PRINZIDE,ZESTORETIC) 20-12.5 MG per tablet, TAKE  ONE TABLET BY MOUTH ONCE DAILY, Disp: 90 tablet, Rfl: 0 Lysine HCl 1000 MG TABS, Take 1 tablet by mouth daily.  , Disp: , Rfl: ;  Multiple Vitamin (MULTIVITAMIN) tablet, Take 1 tablet by mouth daily.  , Disp: , Rfl: ;  valACYclovir (VALTREX) 1000 MG tablet, TAKE TWO TABLETS BY MOUTH TWICE DAILY FOR 1 DAY AS NEEDED., Disp: 20 tablet, Rfl: 3  EXAM:  Filed Vitals:   12/24/14 1235  BP: 122/72  Pulse: 84  Temp: 97.9 F (36.6 C)    Body mass index is 28.6 kg/(m^2).  GENERAL: vitals reviewed and listed above, alert, oriented, appears well hydrated and in no acute distress  HEENT: atraumatic, conjunttiva clear, no obvious abnormalities on inspection of external nose and ears, normal appearance of ear canals and TMs, clear nasal congestion, mild post oropharyngeal erythema with PND, no tonsillar edema or exudate, no sinus TTP  NECK: no obvious masses on inspection  LUNGS: clear to auscultation bilaterally, no wheezes, rales or rhonchi, good air movement  CV: HRRR, no peripheral edema  MS: moves all extremities without noticeable abnormality  PSYCH: pleasant and cooperative, no obvious depression or anxiety  ASSESSMENT AND PLAN:  Discussed the following assessment and plan:  Acute upper respiratory infection  -VURI suspected -advised supportive care, discussed transmission, course, reasons to follow  up -offered cough medication but he feels OTC products are working -Patient advised to return or notify a doctor immediately if symptoms worsen or persist or new concerns arise.  Patient Instructions  INSTRUCTIONS FOR UPPER RESPIRATORY INFECTION:  -plenty of rest and fluids  -flonase daily for 21 days  -nasal saline wash 2-3 times daily (use prepackaged nasal saline or bottled/distilled water if making your own)   -can use afrin nasal spray for drainage and nasal congestion - but do NOT use longer then 3-4 days  -can use tylenol or ibuprofen as directed for aches and  sorethroat  -in the winter time, using a humidifier at night is helpful (please follow cleaning instructions)  -if you are taking a cough medication - use only as directed, may also try a teaspoon of honey to coat the throat and throat lozenges  -for sore throat, salt water gargles can help  -follow up if you have fevers, facial pain, tooth pain, difficulty breathing or are worsening or not getting better in 5-7 days      KIM, HANNAH R.

## 2014-12-24 NOTE — Progress Notes (Signed)
Pre visit review using our clinic review tool, if applicable. No additional management support is needed unless otherwise documented below in the visit note. 

## 2015-02-12 ENCOUNTER — Other Ambulatory Visit: Payer: Self-pay | Admitting: Internal Medicine

## 2015-04-28 ENCOUNTER — Other Ambulatory Visit: Payer: Medicare Other | Admitting: Family Medicine

## 2015-04-29 ENCOUNTER — Encounter: Payer: Self-pay | Admitting: Family Medicine

## 2015-04-29 ENCOUNTER — Ambulatory Visit (INDEPENDENT_AMBULATORY_CARE_PROVIDER_SITE_OTHER): Payer: Medicare HMO | Admitting: Family Medicine

## 2015-04-29 ENCOUNTER — Other Ambulatory Visit: Payer: Medicare Other | Admitting: Family Medicine

## 2015-04-29 VITALS — BP 112/70 | HR 75 | Temp 97.8°F | Wt 204.0 lb

## 2015-04-29 DIAGNOSIS — Z23 Encounter for immunization: Secondary | ICD-10-CM

## 2015-04-29 DIAGNOSIS — I10 Essential (primary) hypertension: Secondary | ICD-10-CM | POA: Diagnosis not present

## 2015-04-29 DIAGNOSIS — E119 Type 2 diabetes mellitus without complications: Secondary | ICD-10-CM

## 2015-04-29 LAB — HEMOGLOBIN A1C: Hgb A1c MFr Bld: 7 % — ABNORMAL HIGH (ref 4.6–6.5)

## 2015-04-29 NOTE — Progress Notes (Signed)
Garret Reddish, MD  Subjective:  William Brewer is a 69 y.o. year old very pleasant male patient who presents with:  Diabetes Mellitus II- controlled with diet and exercise -weight goal 190. Lost 5 lbs now at 204. Exercise has increased. Food choices improving ROS- no polyuria, blurry vision. No hypoglycemia  Hypertension-controlled  BP Readings from Last 3 Encounters:  04/29/15 112/70  12/24/14 122/72  11/27/14 112/68   Home BP monitoring-no Compliant with medications-yes without side effects ROS-Denies any CP, HA, SOB, blurry vision  Past Medical History- hyperlipidemia and refuses statin, peripheral neuropathy  Medications- reviewed and updated Current Outpatient Prescriptions  Medication Sig Dispense Refill  . ALPHA LIPOIC ACID PO Take 1 tablet by mouth daily.      Marland Kitchen aspirin 81 MG tablet Take 81 mg by mouth daily.      . Cholecalciferol (VITAMIN D) 2000 UNITS tablet Take 2,000 Units by mouth daily.      Marland Kitchen lisinopril-hydrochlorothiazide (PRINZIDE,ZESTORETIC) 20-12.5 MG per tablet TAKE ONE TABLET BY MOUTH ONCE DAILY 90 tablet 0  . Lysine HCl 1000 MG TABS Take 1 tablet by mouth daily.      . Multiple Vitamin (MULTIVITAMIN) tablet Take 1 tablet by mouth daily.      . valACYclovir (VALTREX) 1000 MG tablet TAKE TWO TABLETS BY MOUTH TWICE DAILY FOR 1 DAY AS NEEDED. (Patient not taking: Reported on 04/29/2015) 20 tablet 3   Objective: BP 112/70 mmHg  Pulse 75  Temp(Src) 97.8 F (36.6 C)  Wt 204 lb (92.534 kg) Gen: NAD, resting comfortably CV: RRR no murmurs rubs or gallops Lungs: CTAB no crackles, wheeze, rhonchi Abdomen: soft/nontender/nondistended/normal bowel sounds. No rebound or guarding.  Ext: no edema Skin: warm, dry, no rash  Diabetic Foot Exam - Simple   Simple Foot Form  Visual Inspection  No deformities, no ulcerations, no other skin breakdown bilaterally:  Yes  Sensation Testing  See comments:  Yes  Pulse Check  Posterior Tibialis and Dorsalis pulse intact  bilaterally:  Yes  Comments  No sensation on monofilament until the knee bsaically bilaterally. Nails curling over ends of toes (recommended pedicure)     Assessment/Plan:  Diabetes mellitus type II, controlled a1c down to 7 from 7.8 with diet/weight loss/exercise efforts   Essential hypertension Controlled on Lisinopril hydrochlorothiazide 20-12.5 mg, continue   6 month return. Advised pedicure for naisl  Orders Placed This Encounter  Procedures  . Pneumococcal conjugate vaccine 13-valent   Results for orders placed or performed in visit on 04/29/15 (from the past 24 hour(s))  Hemoglobin A1c     Status: Abnormal   Collection Time: 04/29/15 10:30 AM  Result Value Ref Range   Hgb A1c MFr Bld 7.0 (H) 4.6 - 6.5 %

## 2015-04-29 NOTE — Patient Instructions (Addendum)
Received final pneumonia shot ([id number]).  Have eye exam faxed to [place] at [phone].    If your a1c is above 7, we will start metformin 500mg  twice a day. If it is above 8, likely start 1000mg  twice a day (but start at 500mg  to make sure you tolerate it). Most common side effects are stomach related-diarrhea, upset stomach  BP looks great  Depending on a1c 3-6 month follow up

## 2015-04-29 NOTE — Assessment & Plan Note (Signed)
a1c down to 7 from 7.8 with diet/weight loss/exercise efforts

## 2015-04-29 NOTE — Assessment & Plan Note (Signed)
Controlled on Lisinopril hydrochlorothiazide 20-12.5 mg, continue

## 2015-04-30 ENCOUNTER — Telehealth: Payer: Self-pay | Admitting: Family Medicine

## 2015-04-30 NOTE — Telephone Encounter (Signed)
He was supposed to schedule a 6 month f/u with Dr. Yong Channel when he left yesterday so he needs to schedule an appointment for 6 month f/u

## 2015-04-30 NOTE — Telephone Encounter (Signed)
Pt called to say that he should be having his a1c checked in 6 months. Does he need an appt or just a a1c check if just a1c check we need orders please

## 2015-04-30 NOTE — Telephone Encounter (Signed)
lmovm to c/b and sch 6 month fup

## 2015-05-20 ENCOUNTER — Other Ambulatory Visit: Payer: Self-pay | Admitting: Family Medicine

## 2015-06-21 ENCOUNTER — Other Ambulatory Visit: Payer: Self-pay

## 2015-08-09 ENCOUNTER — Other Ambulatory Visit: Payer: Self-pay | Admitting: Internal Medicine

## 2015-08-09 NOTE — Telephone Encounter (Signed)
Refill ok? 

## 2015-08-09 NOTE — Telephone Encounter (Signed)
Need more info. Does he have genital herpes or cold sores? I see med on list but I don't see reason under problem list or medical history. How often does he have outbreaks?

## 2015-08-10 ENCOUNTER — Encounter: Payer: Self-pay | Admitting: Family Medicine

## 2015-08-10 DIAGNOSIS — B001 Herpesviral vesicular dermatitis: Secondary | ICD-10-CM | POA: Insufficient documentation

## 2015-08-10 NOTE — Telephone Encounter (Signed)
May fill 

## 2015-08-10 NOTE — Telephone Encounter (Signed)
Pt is taking this for fever blisters

## 2015-08-26 ENCOUNTER — Other Ambulatory Visit: Payer: Self-pay | Admitting: Family Medicine

## 2015-09-16 LAB — HM DIABETES EYE EXAM

## 2015-09-21 ENCOUNTER — Encounter: Payer: Self-pay | Admitting: Family Medicine

## 2015-10-01 DIAGNOSIS — C44719 Basal cell carcinoma of skin of left lower limb, including hip: Secondary | ICD-10-CM | POA: Diagnosis not present

## 2015-10-28 ENCOUNTER — Encounter: Payer: Self-pay | Admitting: Family Medicine

## 2015-10-28 ENCOUNTER — Ambulatory Visit (INDEPENDENT_AMBULATORY_CARE_PROVIDER_SITE_OTHER): Payer: Medicare HMO | Admitting: Family Medicine

## 2015-10-28 VITALS — BP 124/70 | Temp 97.9°F | Wt 199.0 lb

## 2015-10-28 DIAGNOSIS — E785 Hyperlipidemia, unspecified: Secondary | ICD-10-CM

## 2015-10-28 DIAGNOSIS — I10 Essential (primary) hypertension: Secondary | ICD-10-CM

## 2015-10-28 DIAGNOSIS — E119 Type 2 diabetes mellitus without complications: Secondary | ICD-10-CM

## 2015-10-28 DIAGNOSIS — G609 Hereditary and idiopathic neuropathy, unspecified: Secondary | ICD-10-CM

## 2015-10-28 DIAGNOSIS — C4491 Basal cell carcinoma of skin, unspecified: Secondary | ICD-10-CM | POA: Insufficient documentation

## 2015-10-28 DIAGNOSIS — Z23 Encounter for immunization: Secondary | ICD-10-CM | POA: Diagnosis not present

## 2015-10-28 LAB — COMPREHENSIVE METABOLIC PANEL
ALT: 45 U/L (ref 0–53)
AST: 33 U/L (ref 0–37)
Albumin: 4.5 g/dL (ref 3.5–5.2)
Alkaline Phosphatase: 60 U/L (ref 39–117)
BUN: 26 mg/dL — ABNORMAL HIGH (ref 6–23)
CO2: 28 meq/L (ref 19–32)
Calcium: 9.8 mg/dL (ref 8.4–10.5)
Chloride: 100 mEq/L (ref 96–112)
Creatinine, Ser: 1.1 mg/dL (ref 0.40–1.50)
GFR: 70.52 mL/min (ref >60.00)
GLUCOSE: 179 mg/dL — AB (ref 70–99)
POTASSIUM: 4.5 meq/L (ref 3.5–5.1)
SODIUM: 138 meq/L (ref 135–145)
TOTAL PROTEIN: 7.4 g/dL (ref 6.0–8.3)
Total Bilirubin: 0.8 mg/dL (ref 0.2–1.2)

## 2015-10-28 LAB — HEMOGLOBIN A1C: HEMOGLOBIN A1C: 8.3 % — AB (ref 4.6–6.5)

## 2015-10-28 NOTE — Assessment & Plan Note (Signed)
S: controlled. On Lisinopril hydrochlorothiazide 20-12.5 mg  BP Readings from Last 3 Encounters:  10/28/15 124/70  04/29/15 112/70  12/24/14 122/72  A/P:Continue current meds:  No change

## 2015-10-28 NOTE — Patient Instructions (Addendum)
Flu shot received today.  Labs before you go  We will call you within a week about your referral to podiatry. If you do not hear within 2 weeks, give [place] a call.   BP looks great  In 6 months schedule a physical- come in fasting and we can update your labs fully

## 2015-10-28 NOTE — Progress Notes (Signed)
Garret Reddish, MD  Subjective:  William Brewer is a 69 y.o. year old very pleasant male patient who presents for/with See problem oriented charting ROS- No chest pain or shortness of breath. No headache or blurry vision. No hypoglycemia.   Past Medical History-  Patient Active Problem List   Diagnosis Date Noted  . Diabetes mellitus type II, controlled ([place]) 09/23/2013    Priority: High  . Idiopathic peripheral neuropathy 01/27/2009    Priority: High  . Essential hypertension 03/26/2008    Priority: Medium  . Hyperlipidemia 06/21/2007    Priority: Medium  . Basal cell carcinoma of skin 10/28/2015    Priority: Low  . History of colonic polyps 06/21/2007    Priority: Low  . Fever blister 08/10/2015    Medications- reviewed and updated Current Outpatient Prescriptions  Medication Sig Dispense Refill  . ALPHA LIPOIC ACID PO Take 1 tablet by mouth daily.      Marland Kitchen aspirin 81 MG tablet Take 81 mg by mouth daily.      . Cholecalciferol (VITAMIN D) 2000 UNITS tablet Take 2,000 Units by mouth daily.      Marland Kitchen lisinopril-hydrochlorothiazide (PRINZIDE,ZESTORETIC) 20-12.5 MG per tablet TAKE ONE TABLET BY MOUTH ONCE DAILY 90 tablet 2  . Lysine HCl 1000 MG TABS Take 1 tablet by mouth daily.      . Multiple Vitamin (MULTIVITAMIN) tablet Take 1 tablet by mouth daily.      . valACYclovir (VALTREX) 1000 MG tablet TAKE TWO TABLETS BY MOUTH TWICE DAILY FOR 1 DAY AS NEEDED (Patient not taking: Reported on 10/28/2015) 20 tablet 0   No current facility-administered medications for this visit.    Objective: BP 124/70 mmHg  Temp(Src) 97.9 F (36.6 C)  Wt 199 lb (90.266 kg) Gen: NAD, resting comfortably Mucous membranes are moist. CV: RRR no murmurs rubs or gallops Lungs: CTAB no crackles, wheeze, rhonchi Abdomen: soft/nontender/nondistended/normal bowel sounds. Ext: no edema Skin: warm, dry Neuro: grossly normal, moves all extremities  Diabetic Foot Exam - Simple   Simple Foot Form  Visual  Inspection  See comments:  Yes  Sensation Testing  See comments:  Yes  Pulse Check  Posterior Tibialis and Dorsalis pulse intact bilaterally:  Yes  Comments  Callus on medial portion of 1st and 2nd toes on left foot- trimmed off without underlying ulceration. Left 1st toe with slightly thickened nail that is only attached at base. Loss of sensation up to past mid shin on monofilament.      Assessment/Plan:  Diabetes mellitus type II, controlled S:controlled with Diet/exercise alone. a1c prior 7.8. Has lost another 5 lbs Lab Results  Component Value Date   HGBA1C 7.0* 04/29/2015  A/P: check a1c today. Continue diet/exercise efforts   Idiopathic peripheral neuropathy S: 15 years, predates diabetes by years- not diabetic neuropathy. 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 2 calluses on foot trimmed. Appears to be close to losing 1st toenail on left foot and has several nails he cannot trip that have curled around toe A/P: refer to podiatry to monitor 1st toenail- patient wants to consider removal on L foot as well as trim nails and be established if callus develop   Essential hypertension S: controlled. On Lisinopril hydrochlorothiazide 20-12.5 mg  BP Readings from Last 3 Encounters:  10/28/15 124/70  04/29/15 112/70  12/24/14 122/72  A/P:Continue current meds:  No change   Hyperlipidemia S: poor control. Refuses statin. On aspirin Lab Results  Component Value Date   CHOL 211* 11/30/2014  HDL 29.50* 11/30/2014   LDLCALC 113* 01/26/2014   LDLDIRECT 120.6 11/30/2014   TRIG 313.0* 11/30/2014   CHOLHDL 7 11/30/2014  A/P: update labs at CPE- with diabetes would continue to recommend statin  Return 6 months for CPE- come fasting to update full labs  Orders Placed This Encounter  Procedures  . Flu Vaccine QUAD 36+ mos IM  . Hemoglobin A1c    [place]  . Comprehensive metabolic panel    [place]  . Ambulatory referral to Podiatry    Referral Priority:   Routine    Referral Type:  Consultation    Referral Reason:  Specialty Services Required    Requested Specialty:  Podiatry    Number of Visits Requested:  1

## 2015-10-28 NOTE — Assessment & Plan Note (Signed)
S: poor control. Refuses statin. On aspirin Lab Results  Component Value Date   CHOL 211* 11/30/2014   HDL 29.50* 11/30/2014   LDLCALC 113* 01/26/2014   LDLDIRECT 120.6 11/30/2014   TRIG 313.0* 11/30/2014   CHOLHDL 7 11/30/2014  A/P: update labs at CPE- with diabetes would continue to recommend statin

## 2015-10-28 NOTE — Assessment & Plan Note (Signed)
S:controlled with Diet/exercise alone. a1c prior 7.8. Has lost another 5 lbs Lab Results  Component Value Date   HGBA1C 7.0* 04/29/2015  A/P: check a1c today. Continue diet/exercise efforts

## 2015-10-28 NOTE — Assessment & Plan Note (Signed)
S: 15 years, predates diabetes by years- not diabetic neuropathy. 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 2 calluses on foot trimmed. Appears to be close to losing 1st toenail on left foot and has several nails he cannot trip that have curled around toe A/P: refer to podiatry to monitor 1st toenail- patient wants to consider removal on L foot as well as trim nails and be established if callus develop

## 2015-10-29 ENCOUNTER — Telehealth: Payer: Self-pay | Admitting: Family Medicine

## 2015-10-29 ENCOUNTER — Other Ambulatory Visit: Payer: Self-pay | Admitting: Family Medicine

## 2015-10-29 DIAGNOSIS — E119 Type 2 diabetes mellitus without complications: Secondary | ICD-10-CM

## 2015-10-29 MED ORDER — METFORMIN HCL 500 MG PO TABS: 500.0000 mg | ORAL_TABLET | Freq: Two times a day (BID) | ORAL | Status: DC

## 2015-10-29 NOTE — Telephone Encounter (Signed)
Metformin called in.  

## 2015-10-29 NOTE — Telephone Encounter (Signed)
keba please call metformin into [facility] [place]

## 2015-11-04 ENCOUNTER — Encounter: Payer: Self-pay | Admitting: Podiatry

## 2015-11-04 ENCOUNTER — Ambulatory Visit (INDEPENDENT_AMBULATORY_CARE_PROVIDER_SITE_OTHER): Payer: Medicare HMO | Admitting: Podiatry

## 2015-11-04 VITALS — BP 115/67 | HR 91 | Resp 14

## 2015-11-04 DIAGNOSIS — M79676 Pain in unspecified toe(s): Secondary | ICD-10-CM

## 2015-11-04 DIAGNOSIS — E1140 Type 2 diabetes mellitus with diabetic neuropathy, unspecified: Secondary | ICD-10-CM

## 2015-11-04 DIAGNOSIS — B351 Tinea unguium: Secondary | ICD-10-CM

## 2015-11-04 NOTE — Progress Notes (Signed)
   Subjective:    Patient ID: William Brewer, male    DOB: Sep 16, 1946, 69 y.o.   MRN: [id number]  HPI This patient presents to the office being newly diagnosed as diabetic.  He says he has peripheral neuropathy for over 15 years.  He says he was diagnosed  With diabetes last week.  He says his immediate problem is non attached nail left big toe.  He desires pain or drainage from the toe.  He also requests evaluation of his other thick and painful nails.   Review of Systems  HENT: Positive for hearing loss.   Neurological: Positive for tremors and numbness.       Objective:   Physical Exam GENERAL APPEARANCE: Alert, conversant. Appropriately groomed. No acute distress.  VASCULAR: Pedal pulses palpable at  [facility] and PT bilateral.  Capillary refill time is immediate to all digits,  Normal temperature gradient.  Digital hair growth is present bilateral  NEUROLOGIC: sensation is absent to 5.07 monofilament at 5/5 sites bilateral.  Light touch is intact bilateral, Muscle strength normal.  MUSCULOSKELETAL: acceptable muscle strength, tone and stability bilateral.  Intrinsic muscluature intact bilateral.  Rectus appearance of foot and digits noted bilateral.   DERMATOLOGIC: skin color, texture, and turgor are within normal limits.  No preulcerative lesions or ulcers  are seen, no interdigital maceration noted.  No open lesions present.  . No drainage noted.   Nails  Thick disfigured discolored nails both feet with the great toenail left hallux unattached from the nail bed.     Assessment & Plan:  Onychomycosis B/L  Diabetic neuropathy.  IE  Debride nails both feet.

## 2016-02-01 ENCOUNTER — Other Ambulatory Visit (INDEPENDENT_AMBULATORY_CARE_PROVIDER_SITE_OTHER): Payer: Medicare HMO

## 2016-02-01 DIAGNOSIS — E119 Type 2 diabetes mellitus without complications: Secondary | ICD-10-CM

## 2016-02-01 LAB — HEMOGLOBIN A1C: Hgb A1c MFr Bld: 6.3 % (ref 4.6–6.5)

## 2016-02-03 ENCOUNTER — Ambulatory Visit: Payer: Medicare HMO | Admitting: Podiatry

## 2016-02-04 ENCOUNTER — Ambulatory Visit: Payer: Medicare HMO | Admitting: Family Medicine

## 2016-02-09 ENCOUNTER — Encounter: Payer: Self-pay | Admitting: Podiatry

## 2016-02-09 ENCOUNTER — Ambulatory Visit (INDEPENDENT_AMBULATORY_CARE_PROVIDER_SITE_OTHER): Payer: Medicare HMO | Admitting: Podiatry

## 2016-02-09 DIAGNOSIS — E1140 Type 2 diabetes mellitus with diabetic neuropathy, unspecified: Secondary | ICD-10-CM | POA: Diagnosis not present

## 2016-02-09 DIAGNOSIS — M79676 Pain in unspecified toe(s): Secondary | ICD-10-CM

## 2016-02-09 DIAGNOSIS — B351 Tinea unguium: Secondary | ICD-10-CM | POA: Diagnosis not present

## 2016-02-09 DIAGNOSIS — G629 Polyneuropathy, unspecified: Secondary | ICD-10-CM

## 2016-02-09 NOTE — Progress Notes (Signed)
Patient ID: William Brewer, male   DOB: 16-Jun-1946, 70 y.o.   MRN: [id number] Complaint:  Visit Type: Patient returns to my office for continued preventative foot care services. Complaint: Patient states" my nails have grown long and thick and become painful to walk and wear shoes" Patient has been diagnosed with DM with no foot complications.  He has peripheral neuropathy.The patient presents for preventative foot care services. No changes to ROS  Podiatric Exam: Vascular: dorsalis pedis and posterior tibial pulses are palpable bilateral. Capillary return is immediate. Temperature gradient is WNL. Skin turgor WNL  Sensorium:  Diminished  Semmes Weinstein monofilament test. Normal tactile sensation bilaterally. Nail Exam: Pt has thick disfigured discolored nails with subungual debris noted bilateral entire nail hallux through fifth toenails Ulcer Exam: There is no evidence of ulcer or pre-ulcerative changes or infection. Orthopedic Exam: Muscle tone and strength are WNL. No limitations in general ROM. No crepitus or effusions noted. Foot type and digits show no abnormalities. Bony prominences are unremarkable. Skin: No Porokeratosis. No infection or ulcers  Diagnosis:  Onychomycosis, , Pain in right toe, pain in left toes  Treatment & Plan Procedures and Treatment: Consent by patient was obtained for treatment procedures. The patient understood the discussion of treatment and procedures well. All questions were answered thoroughly reviewed. Debridement of mycotic and hypertrophic toenails, 1 through 5 bilateral and clearing of subungual debris. No ulceration, no infection noted.  Return Visit-Office Procedure: Patient instructed to return to the office for a follow up visit 3 months for continued evaluation and treatment.    [name] DPM

## 2016-02-14 ENCOUNTER — Ambulatory Visit (INDEPENDENT_AMBULATORY_CARE_PROVIDER_SITE_OTHER): Payer: Medicare HMO | Admitting: Family Medicine

## 2016-02-14 ENCOUNTER — Encounter: Payer: Self-pay | Admitting: Family Medicine

## 2016-02-14 VITALS — BP 110/64 | HR 75 | Temp 97.6°F | Wt 184.0 lb

## 2016-02-14 DIAGNOSIS — I10 Essential (primary) hypertension: Secondary | ICD-10-CM

## 2016-02-14 DIAGNOSIS — E119 Type 2 diabetes mellitus without complications: Secondary | ICD-10-CM

## 2016-02-14 NOTE — Patient Instructions (Addendum)
i think you are doing fantastic. You have made some incredible changes and lost significant weight. Great job with exercise. Diabetes is under excellent control. Continue metformin once a day. I think it is reasonable to take 2nd dose at times.   Repeat evaluation in 3 months and a day, ok to wait until 4 months as well.   Blood pressure in great shape as well. Glad getting regular podiatry care.

## 2016-02-14 NOTE — Assessment & Plan Note (Signed)
S: controlled on Lisinopril hydrochlorothiazide 20-12.5 mg BP Readings from Last 3 Encounters:  02/14/16 110/64  11/04/15 115/67  10/28/15 124/70  A/P: no changes

## 2016-02-14 NOTE — Progress Notes (Signed)
Garret Reddish, MD  Subjective:  William Brewer is a 70 y.o. year old very pleasant male patient who presents for/with See problem oriented charting ROS- no hypoglycemia. No chest pain or shortness of breath. No headache or blurry vision.   Past Medical History-  Patient Active Problem List   Diagnosis Date Noted  . Diabetes mellitus type II, controlled ([place]) 09/23/2013    Priority: High  . Idiopathic peripheral neuropathy 01/27/2009    Priority: High  . Essential hypertension 03/26/2008    Priority: Medium  . Hyperlipidemia 06/21/2007    Priority: Medium  . Basal cell carcinoma of skin 10/28/2015    Priority: Low  . History of colonic polyps 06/21/2007    Priority: Low  . Fever blister 08/10/2015    Medications- reviewed and updated Current Outpatient Prescriptions  Medication Sig Dispense Refill  . ALPHA LIPOIC ACID PO Take 1 tablet by mouth daily.      Marland Kitchen aspirin 81 MG tablet Take 81 mg by mouth daily.      . Cholecalciferol (VITAMIN D) 2000 UNITS tablet Take 2,000 Units by mouth daily.      Marland Kitchen lisinopril-hydrochlorothiazide (PRINZIDE,ZESTORETIC) 20-12.5 MG per tablet TAKE ONE TABLET BY MOUTH ONCE DAILY 90 tablet 2  . Lysine HCl 1000 MG TABS Take 1 tablet by mouth daily.      . metFORMIN (GLUCOPHAGE) 500 MG tablet Take 1 tablet (500 mg total) by mouth 2 (two) times daily with a meal. 60 tablet 3  . Multiple Vitamin (MULTIVITAMIN) tablet Take 1 tablet by mouth daily.      . valACYclovir (VALTREX) 1000 MG tablet TAKE TWO TABLETS BY MOUTH TWICE DAILY FOR 1 DAY AS NEEDED 20 tablet 0   No current facility-administered medications for this visit.    Objective: BP 110/64 mmHg  Pulse 75  Temp(Src) 97.6 F (36.4 C)  Wt 184 lb (83.462 kg) Gen: NAD, resting comfortably CV: RRR no murmurs rubs or gallops Lungs: CTAB no crackles, wheeze, rhonchi Abdomen: soft/nontender/nondistended/normal bowel sounds. No rebound or guarding.  Ext: no edema Skin: warm, dry, no  rash  Assessment/Plan:  Diabetes mellitus type II, controlled ([place]) S: very well controlled. On metformin 500mg  daily- at times takes twice a day CBGs- only one he has checked was around 110 Exercise and diet- 6-7 days a week of exercise. Eating cleaner diet  Lab Results  Component Value Date   HGBA1C 6.3 02/01/2016   HGBA1C 8.3* 10/28/2015   HGBA1C 7.0* 04/29/2015   A/P: drastic improvement- likely 1 point down from diet/exercise and 1 from metformin. We will continue low dose 1-2x a day (takes 2nd dose if has not eaten well or missed exercise). 3-4 month follow up. Advised patient to go into weight maintenance mode at this point with target range 180-190 in weight.     Essential hypertension S: controlled on Lisinopril hydrochlorothiazide 20-12.5 mg BP Readings from Last 3 Encounters:  02/14/16 110/64  11/04/15 115/67  10/28/15 124/70  A/P: no changes     Return in about 3- 4 months (around 06/13/2016). Return precautions advised.

## 2016-02-14 NOTE — Assessment & Plan Note (Signed)
S: very well controlled. On metformin 500mg  daily- at times takes twice a day CBGs- only one he has checked was around 110 Exercise and diet- 6-7 days a week of exercise. Eating cleaner diet  Lab Results  Component Value Date   HGBA1C 6.3 02/01/2016   HGBA1C 8.3* 10/28/2015   HGBA1C 7.0* 04/29/2015   A/P: drastic improvement- likely 1 point down from diet/exercise and 1 from metformin. We will continue low dose 1-2x a day (takes 2nd dose if has not eaten well or missed exercise). 3-4 month follow up. Advised patient to go into weight maintenance mode at this point with target range 180-190 in weight.

## 2016-03-15 ENCOUNTER — Other Ambulatory Visit: Payer: Self-pay | Admitting: Family Medicine

## 2016-04-03 ENCOUNTER — Other Ambulatory Visit: Payer: Self-pay | Admitting: Family Medicine

## 2016-05-02 ENCOUNTER — Telehealth: Payer: Self-pay | Admitting: Neurology

## 2016-05-02 NOTE — Telephone Encounter (Signed)
[facility] [phone] called 5/9 3:30pm to inquire if records release that was faxed to our office this morning, was received. Retrieving records on behalf of [employer]. Advised, this patient has not been seen in our office. That he may want to refer back to [facility]. Nori Riis states he spoke with Hilda Blades in our office around 11:00am this morning and was advised this was patient of Dr. Floyde Parkins. Nori Riis then offered for me to look up patient by SSN, I advised if he wants to give me the SSN to look up patient, I could do that, however I would not be able to verify any part of the SSN, Nori Riis then states he needs to check with his supervisor to see if he can give me the SSN to look patient up.

## 2016-05-02 NOTE — Telephone Encounter (Signed)
William Brewer with [facility] [phone] called requesting if MR release had been received. Operator asked how far back did he need records, he said 3 years. Operator explained pt has not been seen in this office. He then began inquiring if Dr Floyde Parkins worked at this office, he verified the address. He then asked if I would verify the pt with the last of 4 digits of the pt's social security number. Operator explained pt had not been seen in this office. He questioned that I could verify the last of the social. Operator said I could not give ANY information to him. Operator asked why he was inquiring this information from this office if the person was not a patient. He said the information was needed for the documentation. He thanked me and hung up. FYI

## 2016-05-03 ENCOUNTER — Telehealth: Payer: Self-pay | Admitting: Neurology

## 2016-05-03 ENCOUNTER — Ambulatory Visit: Payer: Medicare HMO | Admitting: Podiatry

## 2016-05-03 NOTE — Telephone Encounter (Signed)
William Brewer with [employer] is calling to see if you received the fax order for medical records for patient.  Phone [phone]. Thanks!

## 2016-05-10 ENCOUNTER — Ambulatory Visit (INDEPENDENT_AMBULATORY_CARE_PROVIDER_SITE_OTHER): Payer: Medicare HMO | Admitting: Podiatry

## 2016-05-10 ENCOUNTER — Encounter: Payer: Self-pay | Admitting: Podiatry

## 2016-05-10 DIAGNOSIS — G629 Polyneuropathy, unspecified: Secondary | ICD-10-CM

## 2016-05-10 DIAGNOSIS — B351 Tinea unguium: Secondary | ICD-10-CM | POA: Diagnosis not present

## 2016-05-10 DIAGNOSIS — E1140 Type 2 diabetes mellitus with diabetic neuropathy, unspecified: Secondary | ICD-10-CM

## 2016-05-10 DIAGNOSIS — M79676 Pain in unspecified toe(s): Secondary | ICD-10-CM | POA: Diagnosis not present

## 2016-05-10 DIAGNOSIS — L02612 Cutaneous abscess of left foot: Secondary | ICD-10-CM

## 2016-05-10 DIAGNOSIS — L03032 Cellulitis of left toe: Secondary | ICD-10-CM

## 2016-05-10 MED ORDER — DOXYCYCLINE HYCLATE 100 MG PO TABS: 100.0000 mg | ORAL_TABLET | Freq: Two times a day (BID) | ORAL | Status: DC

## 2016-05-10 NOTE — Progress Notes (Signed)
Patient ID: William Brewer, male   DOB: 1946/05/13, 70 y.o.   MRN: [id number] Complaint:  Visit Type: Patient returns to my office for continued preventative foot care services. Complaint: Patient states" my nails have grown long and thick and become painful to walk and wear shoes" Patient has been diagnosed with DM with no foot complications.  He has peripheral neuropathy.The patient presents for preventative foot care services. No changes to ROS.  He also says his second toe left foot has become red and swollen.  Podiatric Exam: Vascular: dorsalis pedis and posterior tibial pulses are palpable bilateral. Capillary return is immediate. Temperature gradient is WNL. Skin turgor WNL  Sensorium:  Diminished  Semmes Weinstein monofilament test. Normal tactile sensation bilaterally. Nail Exam: Pt has thick disfigured discolored nails with subungual debris noted bilateral entire nail hallux through fifth toenails Ulcer Exam: There is no evidence of ulcer or pre-ulcerative changes or infection. Orthopedic Exam: Muscle tone and strength are WNL. No limitations in general ROM. No crepitus or effusions noted. Foot type and digits show no abnormalities. Bony prominences are unremarkable. Skin: No Porokeratosis. No infection or ulcers.  Redness and swelling through dorsum  Second toe left foot. Distal clavi tip second toe left foot. Diagnosis:  Onychomycosis, , Pain in right toe, pain in left toes  Cellulitis second toe left foot.  Treatment & Plan Procedures and Treatment: Consent by patient was obtained for treatment procedures. The patient understood the discussion of treatment and procedures well. All questions were answered thoroughly reviewed. Debridement of mycotic and hypertrophic toenails, 1 through 5 bilateral and clearing of subungual debris. No ulceration, no infection noted.  Debride distal clavi  Neosporin/DSD  Home instructions to be dispensed.  Prescribe doxycycline # 20.  RTC 1 week for infection  evaluation.  RTC 10 weeks for nail care.  Return Visit-Office Procedure: Patient instructed to return to the office for a follow up visit 3 months for continued evaluation and treatment.    [name] DPM

## 2016-05-12 NOTE — Telephone Encounter (Signed)
I spoke to [place], advise him that, the pt has not been seen in this office.

## 2016-05-12 NOTE — Telephone Encounter (Signed)
No records on this pt.

## 2016-05-17 ENCOUNTER — Encounter: Payer: Self-pay | Admitting: Podiatry

## 2016-05-17 ENCOUNTER — Ambulatory Visit (INDEPENDENT_AMBULATORY_CARE_PROVIDER_SITE_OTHER): Payer: Medicare HMO | Admitting: Podiatry

## 2016-05-17 VITALS — BP 103/57 | HR 63 | Resp 14

## 2016-05-17 DIAGNOSIS — L02612 Cutaneous abscess of left foot: Secondary | ICD-10-CM

## 2016-05-17 DIAGNOSIS — L03032 Cellulitis of left toe: Secondary | ICD-10-CM | POA: Diagnosis not present

## 2016-05-17 DIAGNOSIS — G629 Polyneuropathy, unspecified: Secondary | ICD-10-CM | POA: Diagnosis not present

## 2016-05-17 MED ORDER — DOXYCYCLINE HYCLATE 100 MG PO TABS: 100.0000 mg | ORAL_TABLET | Freq: Two times a day (BID) | ORAL | Status: DC

## 2016-05-17 NOTE — Progress Notes (Signed)
Subjective:     Patient ID: William Brewer, male   DOB: Mar 17, 1946, 70 y.o.   MRN: [id number]  HPI this patient returns to the office follow-up for diagnosis of a cellulitis, second toe of the left foot with associated fungus toenail and distal clavi.  He has been soaking his foot and taking doxycycline. #21 twice a day he presents the office today stating that his foot has less pain noted to the second toe and there is no drainage to the second toe. He believes his toe is doing well. He presents the office for continued evaluation and treatment of this infection   Review of Systems     Objective:   Physical Exam GENERAL APPEARANCE: Alert, conversant. Appropriately groomed. No acute distress.  VASCULAR: Pedal pulses are  palpable at  [facility] and PT bilateral.  Capillary refill time is immediate to all digits,  Normal temperature gradient.  Digital hair growth is present bilateral  NEUROLOGIC: sensation is normal to 5.07 monofilament at 5/5 sites bilateral.  Light touch is intact bilateral, Muscle strength normal.  MUSCULOSKELETAL: acceptable muscle strength, tone and stability bilateral.  Intrinsic muscluature intact bilateral.  Rectus appearance of foot and digits noted bilateral.   DERMATOLOGIC: skin color, texture, and turgor are within normal limits.  No preulcerative lesions or ulcers  are seen, no interdigital maceration noted.  No open lesions present.  . No drainage noted. Thick disfigured discolored nails both feet. Redness has resolved and swelling has lessened.  No palpable pain.      Assessment:     Cellulitis second toe left foot     Plan:     ROV.  Refill on doxycycline.  RTC for nail care.  To consider future surgery for nail removal second toe left foot.   [name] DPM

## 2016-05-22 ENCOUNTER — Other Ambulatory Visit: Payer: Self-pay | Admitting: Podiatry

## 2016-06-07 ENCOUNTER — Other Ambulatory Visit: Payer: Self-pay | Admitting: Family Medicine

## 2016-06-13 ENCOUNTER — Ambulatory Visit: Payer: Medicare HMO | Admitting: Family Medicine

## 2016-06-15 ENCOUNTER — Ambulatory Visit (INDEPENDENT_AMBULATORY_CARE_PROVIDER_SITE_OTHER): Payer: Medicare HMO | Admitting: Family Medicine

## 2016-06-15 ENCOUNTER — Encounter: Payer: Self-pay | Admitting: Family Medicine

## 2016-06-15 VITALS — BP 114/70 | HR 67 | Temp 97.8°F | Ht 71.5 in | Wt 186.0 lb

## 2016-06-15 DIAGNOSIS — E785 Hyperlipidemia, unspecified: Secondary | ICD-10-CM | POA: Diagnosis not present

## 2016-06-15 DIAGNOSIS — Z1211 Encounter for screening for malignant neoplasm of colon: Secondary | ICD-10-CM

## 2016-06-15 DIAGNOSIS — I10 Essential (primary) hypertension: Secondary | ICD-10-CM | POA: Diagnosis not present

## 2016-06-15 DIAGNOSIS — E119 Type 2 diabetes mellitus without complications: Secondary | ICD-10-CM | POA: Diagnosis not present

## 2016-06-15 LAB — HEMOGLOBIN A1C: HEMOGLOBIN A1C: 6 % (ref 4.6–6.5)

## 2016-06-15 LAB — LIPID PANEL
CHOL/HDL RATIO: 6
Cholesterol: 190 mg/dL (ref 0–200)
HDL: 34.6 mg/dL — ABNORMAL LOW (ref >39.00)
LDL Cholesterol: 118 mg/dL — ABNORMAL HIGH (ref 0–99)
NONHDL: 155.71
Triglycerides: 188 mg/dL — ABNORMAL HIGH (ref 0.0–149.0)
VLDL: 37.6 mg/dL (ref 0.0–40.0)

## 2016-06-15 LAB — CBC
HCT: 43.9 % (ref 39.0–52.0)
Hemoglobin: 14.7 g/dL (ref 13.0–17.0)
MCHC: 33.5 g/dL (ref 30.0–36.0)
MCV: 93.2 fl (ref 78.0–100.0)
Platelets: 208 10*3/uL (ref 150.0–400.0)
RBC: 4.71 Mil/uL (ref 4.22–5.81)
RDW: 14.3 % (ref 11.5–15.5)
WBC: 5.7 10*3/uL (ref 4.0–10.5)

## 2016-06-15 LAB — COMPREHENSIVE METABOLIC PANEL
ALT: 25 U/L (ref 0–53)
AST: 21 U/L (ref 0–37)
Albumin: 4.6 g/dL (ref 3.5–5.2)
Alkaline Phosphatase: 49 U/L (ref 39–117)
BUN: 28 mg/dL — AB (ref 6–23)
CHLORIDE: 102 meq/L (ref 96–112)
CO2: 31 meq/L (ref 19–32)
CREATININE: 1.08 mg/dL (ref 0.40–1.50)
Calcium: 9.7 mg/dL (ref 8.4–10.5)
GFR: 71.9 mL/min (ref >60.00)
GLUCOSE: 94 mg/dL (ref 70–99)
Potassium: 4.4 mEq/L (ref 3.5–5.1)
SODIUM: 140 meq/L (ref 135–145)
Total Bilirubin: 0.7 mg/dL (ref 0.2–1.2)
Total Protein: 7.1 g/dL (ref 6.0–8.3)

## 2016-06-15 NOTE — Progress Notes (Signed)
Subjective:  William Brewer is a 70 y.o. year old very pleasant male patient who presents for/with See problem oriented charting ROS- No chest pain or shortness of breath. No headache or blurry vision. No hypoglycemia.see any ROS included in HPI as well.   Past Medical History-  Patient Active Problem List   Diagnosis Date Noted  . Diabetes mellitus type II, controlled ([place]) 09/23/2013    Priority: High  . Idiopathic peripheral neuropathy 01/27/2009    Priority: High  . Essential hypertension 03/26/2008    Priority: Medium  . Hyperlipidemia 06/21/2007    Priority: Medium  . Basal cell carcinoma of skin 10/28/2015    Priority: Low  . History of colonic polyps 06/21/2007    Priority: Low  . Fever blister 08/10/2015    Medications- reviewed and updated Current Outpatient Prescriptions  Medication Sig Dispense Refill  . ALPHA LIPOIC ACID PO Take 1 tablet by mouth daily.      Marland Kitchen aspirin 81 MG tablet Take 81 mg by mouth daily.      . Cholecalciferol (VITAMIN D) 2000 UNITS tablet Take 2,000 Units by mouth daily.      Marland Kitchen lisinopril-hydrochlorothiazide (PRINZIDE,ZESTORETIC) 20-12.5 MG tablet TAKE ONE TABLET BY MOUTH ONCE DAILY 90 tablet 4  . Lysine HCl 1000 MG TABS Take 1 tablet by mouth daily.      . metFORMIN (GLUCOPHAGE) 500 MG tablet TAKE ONE TABLET BY MOUTH TWICE DAILY WITH MEALS 60 tablet 3  . Multiple Vitamin (MULTIVITAMIN) tablet Take 1 tablet by mouth daily.      . valACYclovir (VALTREX) 1000 MG tablet TAKE TWO TABLETS BY MOUTH TWICE DAILY FOR 1 DAY AS NEEDED 20 tablet 3   No current facility-administered medications for this visit.    Objective: BP 114/70 mmHg  Pulse 67  Temp(Src) 97.8 F (36.6 C) (Oral)  Ht 5' 11.5" (1.816 m)  Wt 186 lb (84.369 kg)  BMI 25.58 kg/m2  SpO2 99% Gen: NAD, resting comfortably CV: RRR no murmurs rubs or gallops Lungs: CTAB no crackles, wheeze, rhonchi Abdomen: soft/nontender/nondistended/normal bowel sounds.  Ext: no edema Skin: warm,  dry, no rash Neuro: grossly normal, moves all extremities  Assessment/Plan:  Hypertension S: controlled. On lisinopril hctz 20-12.5mg   BP Readings from Last 3 Encounters:  06/15/16 114/70  05/17/16 103/57  02/14/16 110/64  A/P:Continue current meds:  Doing well  Diabetes mellitus type II, controlled ([place]) S: well controlled. On metformin 500mg  twice a day, went back to once a day for most part- but at times takes 2 a day.  Exercise and diet- doing reasonably well on exercise, some poor dietary choices  Lab Results  Component Value Date   HGBA1C 6.3 02/01/2016   HGBA1C 8.3* 10/28/2015   HGBA1C 7.0* 04/29/2015   A/P: actually thankful no further weight loss- we had been concerned about potential unintentional weight loss. We will update a1c today, likely continue once a day metformin at his current lower weight  Hyperlipidemia S: poorly controlled on no medicine. No myalgias.  Lab Results  Component Value Date   CHOL 211* 11/30/2014   HDL 29.50* 11/30/2014   LDLCALC 113* 01/26/2014   LDLDIRECT 120.6 11/30/2014   TRIG 313.0* 11/30/2014   CHOLHDL 7 11/30/2014   A/P: previously had refused statin- willing to reconsider as he was asking about CVA/MI risk- discussed statin likely best thing he could do to reduce risk- update lipids and will consider if LDL over 100. Also wants to discuss red yeast rice option  Return in about 4 months (around 10/15/2016). refer for colonoscopy. Wants awv at home through [place] only right now.   Orders Placed This Encounter  Procedures  . CBC    [place]  . Comprehensive metabolic panel    [place]    Order Specific Question:  Has the patient fasted?    Answer:  No  . Lipid panel    [place]    Order Specific Question:  Has the patient fasted?    Answer:  No  . Hemoglobin A1c    [place]  . Ambulatory referral to Gastroenterology    Referral Priority:  Routine    Referral Type:  Consultation    Referral Reason:  Specialty Services  Required    Number of Visits Requested:  1   Return precautions advised.  Garret Reddish, MD

## 2016-06-15 NOTE — Progress Notes (Signed)
Pre visit review using our clinic review tool, if applicable. No additional management support is needed unless otherwise documented below in the visit note. 

## 2016-06-15 NOTE — Assessment & Plan Note (Signed)
S: poorly controlled on no medicine. No myalgias.  Lab Results  Component Value Date   CHOL 211* 11/30/2014   HDL 29.50* 11/30/2014   LDLCALC 113* 01/26/2014   LDLDIRECT 120.6 11/30/2014   TRIG 313.0* 11/30/2014   CHOLHDL 7 11/30/2014   A/P: previously had refused statin- willing to reconsider as he was asking about CVA/MI risk- discussed statin likely best thing he could do to reduce risk- update lipids and will consider if LDL over 100. Also wants to discuss red yeast rice option

## 2016-06-15 NOTE — Patient Instructions (Addendum)
We will call you within a week about your referral for colonoscopy. If you do not hear within 2 weeks, give [place] a call.   Labs before you leave  No changes in medicines unless labs lead [place] to that- ? Diabetes change ? Need for cholesterol medicine

## 2016-06-15 NOTE — Assessment & Plan Note (Signed)
S: well controlled. On metformin 500mg  twice a day, went back to once a day for most part- but at times takes 2 a day.  Exercise and diet- doing reasonably well on exercise, some poor dietary choices  Lab Results  Component Value Date   HGBA1C 6.3 02/01/2016   HGBA1C 8.3* 10/28/2015   HGBA1C 7.0* 04/29/2015   A/P: actually thankful no further weight loss- we had been concerned about potential unintentional weight loss. We will update a1c today, likely continue once a day metformin at his current lower weight

## 2016-06-20 ENCOUNTER — Encounter: Payer: Self-pay | Admitting: Internal Medicine

## 2016-06-22 ENCOUNTER — Telehealth: Payer: Self-pay

## 2016-06-22 NOTE — Telephone Encounter (Signed)
Patient is on the list for [employer] 2017 and may be a good candidate for an AWV in 2017. Please let me know if/when appt is scheduled.   

## 2016-07-18 ENCOUNTER — Encounter: Payer: Self-pay | Admitting: Internal Medicine

## 2016-07-19 ENCOUNTER — Encounter: Payer: Self-pay | Admitting: Podiatry

## 2016-07-19 ENCOUNTER — Ambulatory Visit (INDEPENDENT_AMBULATORY_CARE_PROVIDER_SITE_OTHER): Payer: Medicare HMO | Admitting: Podiatry

## 2016-07-19 DIAGNOSIS — E1140 Type 2 diabetes mellitus with diabetic neuropathy, unspecified: Secondary | ICD-10-CM | POA: Diagnosis not present

## 2016-07-19 DIAGNOSIS — B351 Tinea unguium: Secondary | ICD-10-CM | POA: Diagnosis not present

## 2016-07-19 DIAGNOSIS — L84 Corns and callosities: Secondary | ICD-10-CM

## 2016-07-19 DIAGNOSIS — M79676 Pain in unspecified toe(s): Secondary | ICD-10-CM | POA: Diagnosis not present

## 2016-07-19 NOTE — Progress Notes (Signed)
Patient ID: William Brewer, male   DOB: May 02, 1946, 70 y.o.   MRN: [id number] Complaint:  Visit Type: Patient returns to my office for continued preventative foot care services. Complaint: Patient states" my nails have grown long and thick and become painful to walk and wear shoes" Patient has been diagnosed with DM with no foot complications.  He has peripheral neuropathy.The patient presents for preventative foot care services. No changes to ROS.  Marland Kitchen  Podiatric Exam: Vascular: dorsalis pedis and posterior tibial pulses are palpable bilateral. Capillary return is immediate. Temperature gradient is WNL. Skin turgor WNL  Sensorium:  Diminished  Semmes Weinstein monofilament test. Normal tactile sensation bilaterally. Nail Exam: Pt has thick disfigured discolored nails with subungual debris noted bilateral entire nail hallux through fifth toenails Ulcer Exam: There is no evidence of ulcer or pre-ulcerative changes or infection. Orthopedic Exam: Muscle tone and strength are WNL. No limitations in general ROM. No crepitus or effusions noted. Foot type and digits show no abnormalities. Bony prominences are unremarkable. Skin: No Porokeratosis. No infection or ulcers. Distal clavi noted. Redness and swelling through dorsum  Second toe left foot. Distal clavi tip second toe left foot. Diagnosis:  Onychomycosis, , Pain in right toe, pain in left toes  Clavi second left foot.  Treatment & Plan Procedures and Treatment: Consent by patient was obtained for treatment procedures. The patient understood the discussion of treatment and procedures well. All questions were answered thoroughly reviewed. Debridement of mycotic and hypertrophic toenails, 1 through 5 bilateral and clearing of subungual debris.  Debride clavi.  Discussed future surgery for onychomycosct second toenail left foot. Return Visit-Office Procedure: Patient instructed to return 9 weeks.months for continued evaluation and treatment.    [name] DPM

## 2016-08-15 ENCOUNTER — Ambulatory Visit (AMBULATORY_SURGERY_CENTER): Payer: Self-pay

## 2016-08-15 VITALS — Ht 72.0 in | Wt 192.0 lb

## 2016-08-15 DIAGNOSIS — Z86010 Personal history of colonic polyps: Secondary | ICD-10-CM

## 2016-08-15 NOTE — Progress Notes (Signed)
Per pt, no allergies to soy or egg products.Pt not taking any weight loss meds or using  O2 at home. 

## 2016-08-17 ENCOUNTER — Encounter: Payer: Self-pay | Admitting: Internal Medicine

## 2016-08-30 ENCOUNTER — Ambulatory Visit (AMBULATORY_SURGERY_CENTER): Payer: Medicare HMO | Admitting: Internal Medicine

## 2016-08-30 ENCOUNTER — Encounter: Payer: Self-pay | Admitting: Internal Medicine

## 2016-08-30 VITALS — BP 117/77 | HR 62 | Temp 97.8°F | Resp 13 | Ht 72.0 in | Wt 197.0 lb

## 2016-08-30 DIAGNOSIS — Z860100 Personal history of colon polyps, unspecified: Secondary | ICD-10-CM

## 2016-08-30 DIAGNOSIS — Z1211 Encounter for screening for malignant neoplasm of colon: Secondary | ICD-10-CM | POA: Diagnosis not present

## 2016-08-30 MED ORDER — SODIUM CHLORIDE 0.9 % IV SOLN: 500.0000 mL | INTRAVENOUS | Status: DC

## 2016-08-30 NOTE — Op Note (Signed)
[place] Patient Name: William Brewer Procedure Date: 08/30/2016 9:23 AM MRN: [id number] Endoscopist: Gatha Mayer , MD Age: 70 Referring MD:  Date of Birth: [birth year] Gender: Male Account #: [id number] Procedure:                Colonoscopy Indications:              High risk colon cancer surveillance: Personal                            history of colonic polyps Medicines:                Propofol per Anesthesia, Monitored Anesthesia Care Procedure:                Pre-Anesthesia Assessment:                           - Prior to the procedure, a History and Physical                            was performed, and patient medications and                            allergies were reviewed. The patient's tolerance of                            previous anesthesia was also reviewed. The risks                            and benefits of the procedure and the sedation                            options and risks were discussed with the patient.                            All questions were answered, and informed consent                            was obtained. Prior Anticoagulants: The patient has                            taken no previous anticoagulant or antiplatelet                            agents. ASA Grade Assessment: II - A patient with                            mild systemic disease. After reviewing the risks                            and benefits, the patient was deemed in                            satisfactory condition to undergo the procedure.  After obtaining informed consent, the colonoscope                            was passed under direct vision. Throughout the                            procedure, the patient's blood pressure, pulse, and                            oxygen saturations were monitored continuously. The                            [id number]) was introduced through the anus                            and advanced  to the the cecum, identified by                            appendiceal orifice and ileocecal valve. The                            ileocecal valve, appendiceal orifice, and rectum                            were photographed. The quality of the bowel                            preparation was excellent. The bowel preparation                            used was Miralax. Scope In: 9:34:40 AM Scope Out: 9:48:53 AM Scope Withdrawal Time: 0 hours 10 minutes 12 seconds  Total Procedure Duration: 0 hours 14 minutes 13 seconds  Findings:                 The perianal and digital rectal examinations were                            normal. Pertinent negatives include normal prostate                            (size, shape, and consistency).                           Multiple diverticula were found in the sigmoid                            colon.                           The exam was otherwise without abnormality on                            direct and retroflexion views. Complications:            No immediate complications. Estimated blood loss:  None. Estimated Blood Loss:     Estimated blood loss: none. Recommendation:           - Repeat colonoscopy in 10 years for surveillance.                           - Resume previous diet.                           - Continue present medications.                           - Patient has a contact number available for                            emergencies. The signs and symptoms of potential                            delayed complications were discussed with the                            patient. Return to normal activities tomorrow.                            Written discharge instructions were provided to the                            patient. Gatha Mayer, MD 08/30/2016 9:55:10 AM This report has been signed electronically.

## 2016-08-30 NOTE — Progress Notes (Signed)
A/ox3 pleased with MAC, report to Wendy RN 

## 2016-08-30 NOTE — Patient Instructions (Addendum)
   No polyps again so possible routine colonoscopy in 10 years - 2027.  I appreciate the opportunity to care for you. Gatha Mayer, MD, FACG  YOU HAD AN ENDOSCOPIC PROCEDURE TODAY AT [facility]:   Refer to the procedure report that was given to you for any specific questions about what was found during the examination.  If the procedure report does not answer your questions, please call your gastroenterologist to clarify.  If you requested that your care partner not be given the details of your procedure findings, then the procedure report has been included in a sealed envelope for you to review at your convenience later.  YOU SHOULD EXPECT: Some feelings of bloating in the abdomen. Passage of more gas than usual.  Walking can help get rid of the air that was put into your GI tract during the procedure and reduce the bloating. If you had a lower endoscopy (such as a colonoscopy or flexible sigmoidoscopy) you may notice spotting of blood in your stool or on the toilet paper. If you underwent a bowel prep for your procedure, you may not have a normal bowel movement for a few days.  Please Note:  You might notice some irritation and congestion in your nose or some drainage.  This is from the oxygen used during your procedure.  There is no need for concern and it should clear up in a day or so.  SYMPTOMS TO REPORT IMMEDIATELY:   Following lower endoscopy (colonoscopy or flexible sigmoidoscopy):  Excessive amounts of blood in the stool  Significant tenderness or worsening of abdominal pains  Swelling of the abdomen that is new, acute  Fever of 100F or higher  For urgent or emergent issues, a gastroenterologist can be reached at any hour by calling [phone].   DIET:  We do recommend a small meal at first, but then you may proceed to your regular diet.  Drink plenty of fluids but you should avoid alcoholic beverages for 24 hours.  ACTIVITY:  You should plan to take  it easy for the rest of today and you should NOT DRIVE or use heavy machinery until tomorrow (because of the sedation medicines used during the test).    FOLLOW UP: Our staff will call the number listed on your records the next business day following your procedure to check on you and address any questions or concerns that you may have regarding the information given to you following your procedure. If we do not reach you, we will leave a message.  However, if you are feeling well and you are not experiencing any problems, there is no need to return our call.  We will assume that you have returned to your regular daily activities without incident.  If any biopsies were taken you will be contacted by phone or by letter within the next 1-3 weeks.  Please call us at [phone] if you have not heard about the biopsies in 3 weeks.    SIGNATURES/CONFIDENTIALITY: You and/or your care partner have signed paperwork which will be entered into your electronic medical record.  These signatures attest to the fact that that the information above on your After Visit Summary has been reviewed and is understood.  Full responsibility of the confidentiality of this discharge information lies with you and/or your care-partner.  Please read diverticulosis handout provided.

## 2016-08-31 ENCOUNTER — Telehealth: Payer: Self-pay

## 2016-08-31 NOTE — Telephone Encounter (Signed)
Left a message at [phone] for the pt to call us back if any questions or concerns. maw

## 2016-09-07 ENCOUNTER — Other Ambulatory Visit: Payer: Self-pay | Admitting: Family Medicine

## 2016-09-07 ENCOUNTER — Telehealth: Payer: Self-pay

## 2016-09-07 NOTE — Telephone Encounter (Signed)
Call to William Brewer  Lvm regarding AWV; To see Dr. Yong Channel 10/30 which is a Monday. Asked to call and schedule an apt to complete AWV at his convenience

## 2016-09-18 ENCOUNTER — Other Ambulatory Visit: Payer: Self-pay | Admitting: Family Medicine

## 2016-09-18 MED ORDER — METFORMIN HCL 500 MG PO TABS
500.0000 mg | ORAL_TABLET | Freq: Two times a day (BID) | ORAL | 0 refills | Status: DC
Start: 2016-09-18 — End: 2017-09-10

## 2016-09-20 ENCOUNTER — Encounter: Payer: Self-pay | Admitting: Podiatry

## 2016-09-20 ENCOUNTER — Ambulatory Visit (INDEPENDENT_AMBULATORY_CARE_PROVIDER_SITE_OTHER): Payer: Medicare HMO | Admitting: Podiatry

## 2016-09-20 VITALS — BP 129/76 | HR 63 | Resp 14

## 2016-09-20 DIAGNOSIS — E1140 Type 2 diabetes mellitus with diabetic neuropathy, unspecified: Secondary | ICD-10-CM | POA: Diagnosis not present

## 2016-09-20 DIAGNOSIS — L84 Corns and callosities: Secondary | ICD-10-CM | POA: Diagnosis not present

## 2016-09-20 DIAGNOSIS — G629 Polyneuropathy, unspecified: Secondary | ICD-10-CM

## 2016-09-20 DIAGNOSIS — B351 Tinea unguium: Secondary | ICD-10-CM

## 2016-09-20 DIAGNOSIS — M79676 Pain in unspecified toe(s): Secondary | ICD-10-CM

## 2016-09-20 MED ORDER — CEPHALEXIN 500 MG PO CAPS: 500.0000 mg | ORAL_CAPSULE | Freq: Two times a day (BID) | ORAL | 0 refills | Status: DC

## 2016-09-20 NOTE — Progress Notes (Signed)
Patient ID: William Brewer, male   DOB: 1946/07/11, 70 y.o.   MRN: [id number] Complaint:  Visit Type: Patient returns to my office for continued preventative foot care services. Complaint: Patient states" my nails have grown long and thick and become painful to walk and wear shoes" Patient has been diagnosed with DM with no foot neuropathy.  The patient presents for preventative foot care services. No changes to ROS.  Marland Kitchen  Podiatric Exam: Vascular: dorsalis pedis and posterior tibial pulses are palpable bilateral. Capillary return is immediate. Temperature gradient is WNL. Skin turgor WNL  Sensorium:  Diminished  Semmes Weinstein monofilament test. Normal tactile sensation bilaterally. Nail Exam: Pt has thick disfigured discolored nails with subungual debris noted bilateral entire nail hallux through fifth toenails Ulcer Exam: There is no evidence of ulcer or pre-ulcerative changes or infection. Orthopedic Exam: Muscle tone and strength are WNL. No limitations in general ROM. No crepitus or effusions noted. Foot type and digits show no abnormalities. Bony prominences are unremarkable. Skin: No Porokeratosis. No infection or ulcers. Distal clavi noted. Redness and swelling through dorsum  Second toe left foot. Distal clavi tip second toe left foot.  Redness and swelling at proximal nail fold due to the great toenail cutting into second toe. Diagnosis:  Onychomycosis, , Pain in right toe, pain in left toes  Clavi second left foot.   Treatment & Plan Procedures and Treatment: Consent by patient was obtained for treatment procedures. The patient understood the discussion of treatment and procedures well. All questions were answered thoroughly reviewed. Debridement of mycotic and hypertrophic toenails, 1 through 5 bilateral and clearing of subungual debris.  Debride clavi.  Discussed future surgery for onychomycosct second toenail left foot. Prescribe cephalexin 500 mg  # 15.  RTC 9 weeks. Return  Visit-Office Procedure: Patient instructed to return 9 weeks.months for continued evaluation and treatment.    [name] DPM

## 2016-09-21 DIAGNOSIS — Z85828 Personal history of other malignant neoplasm of skin: Secondary | ICD-10-CM | POA: Diagnosis not present

## 2016-09-21 DIAGNOSIS — L821 Other seborrheic keratosis: Secondary | ICD-10-CM | POA: Diagnosis not present

## 2016-09-21 DIAGNOSIS — L729 Follicular cyst of the skin and subcutaneous tissue, unspecified: Secondary | ICD-10-CM | POA: Diagnosis not present

## 2016-09-21 DIAGNOSIS — L814 Other melanin hyperpigmentation: Secondary | ICD-10-CM | POA: Diagnosis not present

## 2016-09-21 DIAGNOSIS — D1801 Hemangioma of skin and subcutaneous tissue: Secondary | ICD-10-CM | POA: Diagnosis not present

## 2016-09-28 DIAGNOSIS — E119 Type 2 diabetes mellitus without complications: Secondary | ICD-10-CM | POA: Diagnosis not present

## 2016-09-28 LAB — HM DIABETES EYE EXAM

## 2016-10-09 ENCOUNTER — Encounter: Payer: Self-pay | Admitting: Family Medicine

## 2016-10-23 ENCOUNTER — Ambulatory Visit: Payer: Medicare HMO | Admitting: Family Medicine

## 2016-10-24 ENCOUNTER — Ambulatory Visit (INDEPENDENT_AMBULATORY_CARE_PROVIDER_SITE_OTHER): Payer: Medicare HMO | Admitting: Family Medicine

## 2016-10-24 ENCOUNTER — Encounter: Payer: Self-pay | Admitting: Family Medicine

## 2016-10-24 DIAGNOSIS — K4090 Unilateral inguinal hernia, without obstruction or gangrene, not specified as recurrent: Secondary | ICD-10-CM | POA: Diagnosis not present

## 2016-10-24 DIAGNOSIS — I10 Essential (primary) hypertension: Secondary | ICD-10-CM

## 2016-10-24 DIAGNOSIS — Z23 Encounter for immunization: Secondary | ICD-10-CM

## 2016-10-24 DIAGNOSIS — E785 Hyperlipidemia, unspecified: Secondary | ICD-10-CM | POA: Diagnosis not present

## 2016-10-24 DIAGNOSIS — E119 Type 2 diabetes mellitus without complications: Secondary | ICD-10-CM

## 2016-10-24 MED ORDER — LOVASTATIN 20 MG PO TABS: 20.0000 mg | ORAL_TABLET | Freq: Every day | ORAL | 5 refills | Status: DC

## 2016-10-24 NOTE — Assessment & Plan Note (Signed)
S: well controlled. On metformin 500mg  BID Exercise and diet- still walking 5-6 days a week for 30 minutes or pickleball Lab Results  Component Value Date   HGBA1C 6.0 06/15/2016   HGBA1C 6.3 02/01/2016   HGBA1C 8.3 (H) 10/28/2015   A/P: update a1c next visit- declines bloodwork at this time. Future a1c

## 2016-10-24 NOTE — Progress Notes (Signed)
Pre visit review using our clinic review tool, if applicable. No additional management support is needed unless otherwise documented below in the visit note. 

## 2016-10-24 NOTE — Patient Instructions (Addendum)
Lovastatin 20mg  for cholesterol. Would start with once a week to see how you do. Then weekly can increase until on at daily basis (as long as no bothersome side effects). Would want you on stable dosing at least 6 weeks before your labs.   See me in 4 months with labs a few days before- a1c, bad cholesterol, kidney test  Send me a mychart message if decide you want surgical consult for the hernia on the right  You may also sign up for an annual wellness visit with our nurse, Manuela Schwartz, who specializes in the annual wellness exam. This is a free benefit under medicare that may help [place] find additional ways to help you. Some highlights are reviewing medications, lifestyle, and doing a dementia screen.   Up to date on health maintenance! Great job

## 2016-10-24 NOTE — Assessment & Plan Note (Signed)
S:feels pressure if stands or walks for 30 minutes- usually feels around 20 minutes. Rare higher level of pain. Usually a mild aching when occurs.  O: visible bulge on right, with palpation, notable bulge and worsens with valsalva A/P: discussed surgical consult- declines for now but will message me if he opts to pursue this and can discuss at follow

## 2016-10-24 NOTE — Assessment & Plan Note (Signed)
S: controlled on lisnopril 20-12.5mg  BP Readings from Last 3 Encounters:  10/24/16 110/78  09/20/16 129/76  08/30/16 117/77  A/P:Continue current medications, doing well

## 2016-10-24 NOTE — Assessment & Plan Note (Signed)
S: poorly controlled on no statin. Also not taking red yeast rice - poor taste. No myalgias.  Lab Results  Component Value Date   CHOL 190 06/15/2016   HDL 34.60 (L) 06/15/2016   LDLCALC 118 (H) 06/15/2016   LDLDIRECT 120.6 11/30/2014   TRIG 188.0 (H) 06/15/2016   CHOLHDL 6 06/15/2016   A/P: trial low dose lovastatin 20mg  building up to daily

## 2016-10-24 NOTE — Progress Notes (Signed)
Subjective:  William Brewer is a 70 y.o. year old very pleasant male patient who presents for/with See problem oriented charting ROS- some discomfort in right groin- see below. No chest pain or shortness of breath. No headache or blurry vision. No issues with exercise or waling except rightgroin.see any ROS included in HPI as well.   Past Medical History-  Patient Active Problem List   Diagnosis Date Noted  . Diabetes mellitus type II, controlled ([place]) 09/23/2013    Priority: High  . Idiopathic peripheral neuropathy 01/27/2009    Priority: High  . Essential hypertension 03/26/2008    Priority: Medium  . Hyperlipidemia 06/21/2007    Priority: Medium  . Basal cell carcinoma of skin 10/28/2015    Priority: Low  . History of colonic polyps 06/21/2007    Priority: Low  . Right groin hernia 10/24/2016  . Fever blister 08/10/2015    Medications- reviewed and updated Current Outpatient Prescriptions  Medication Sig Dispense Refill  . ALPHA LIPOIC ACID PO Take 1 tablet by mouth daily.      . Ascorbic Acid (VITAMIN C ER PO) Take by mouth.    Marland Kitchen aspirin 81 MG tablet Take 81 mg by mouth daily.      . Cholecalciferol (VITAMIN D) 2000 UNITS tablet Take 2,000 Units by mouth daily.      Marland Kitchen lisinopril-hydrochlorothiazide (PRINZIDE,ZESTORETIC) 20-12.5 MG tablet TAKE ONE TABLET BY MOUTH ONCE DAILY 90 tablet 4  . Lysine HCl 1000 MG TABS Take 1 tablet by mouth daily.      . magnesium 30 MG tablet Take 250 mg by mouth daily.    . Melatonin 3 MG CAPS Take by mouth as needed.    . metFORMIN (GLUCOPHAGE) 500 MG tablet Take 1 tablet (500 mg total) by mouth 2 (two) times daily with a meal. 180 tablet 0  . Multiple Vitamin (MULTIVITAMIN) tablet Take 1 tablet by mouth daily.      . NON FORMULARY Legatrin PM-Take one daily    . SUPER B COMPLEX/C PO Take by mouth daily.    . valACYclovir (VALTREX) 1000 MG tablet TAKE TWO TABLETS BY MOUTH TWICE DAILY FOR 1 DAY AS NEEDED 20 tablet 3  . lovastatin (MEVACOR) 20  MG tablet Take 1 tablet (20 mg total) by mouth at bedtime. 31 tablet 5   Objective: BP 110/78 (BP Location: Left Arm, Patient Position: Sitting, Cuff Size: Normal)   Pulse 60   Temp 97.6 F (36.4 C) (Oral)   Wt 186 lb 3.2 oz (84.5 kg)   SpO2 99%   BMI 25.25 kg/m  Gen: NAD, resting comfortably CV: RRR no murmurs rubs or gallops Lungs: CTAB no crackles, wheeze, rhonchi MSK- see below Ext: no edema Hip exam normal  Assessment/Plan:  Diabetes mellitus type II, controlled ([place]) S: well controlled. On metformin 500mg  BID Exercise and diet- still walking 5-6 days a week for 30 minutes or pickleball Lab Results  Component Value Date   HGBA1C 6.0 06/15/2016   HGBA1C 6.3 02/01/2016   HGBA1C 8.3 (H) 10/28/2015   A/P: update a1c next visit- declines bloodwork at this time. Future a1c  Hyperlipidemia S: poorly controlled on no statin. Also not taking red yeast rice - poor taste. No myalgias.  Lab Results  Component Value Date   CHOL 190 06/15/2016   HDL 34.60 (L) 06/15/2016   LDLCALC 118 (H) 06/15/2016   LDLDIRECT 120.6 11/30/2014   TRIG 188.0 (H) 06/15/2016   CHOLHDL 6 06/15/2016   A/P: trial low dose  lovastatin 20mg  building up to daily  Essential hypertension S: controlled on lisnopril 20-12.5mg  BP Readings from Last 3 Encounters:  10/24/16 110/78  09/20/16 129/76  08/30/16 117/77  A/P:Continue current medications, doing well   Right groin hernia S:feels pressure if stands or walks for 30 minutes- usually feels around 20 minutes. Rare higher level of pain. Usually a mild aching when occurs.  O: visible bulge on right, with palpation, notable bulge and worsens with valsalva A/P: discussed surgical consult- declines for now but will message me if he opts to pursue this and can discuss at follow   4 month follow up  Orders Placed This Encounter  Procedures  . Flu vaccine HIGH DOSE PF  . Hemoglobin A1c    [place]    Standing Status:   Future    Standing Expiration  Date:   10/24/2017  . Basic metabolic panel    Standing Status:   Future    Standing Expiration Date:   10/24/2017  . LDL cholesterol, direct    [place]    Standing Status:   Future    Standing Expiration Date:   10/24/2017    Meds ordered this encounter  Medications  . lovastatin (MEVACOR) 20 MG tablet    Sig: Take 1 tablet (20 mg total) by mouth at bedtime.    Dispense:  31 tablet    Refill:  5    Return precautions advised.  Garret Reddish, MD

## 2016-11-07 ENCOUNTER — Telehealth: Payer: Self-pay | Admitting: Family Medicine

## 2016-11-07 NOTE — Telephone Encounter (Signed)
Pt would like referral to someone to address his hernia. Pt states he sent a mychart message, but I do not see.

## 2016-11-07 NOTE — Telephone Encounter (Signed)
Yes thanks, may refer to general surgery under hernia

## 2016-11-08 ENCOUNTER — Other Ambulatory Visit: Payer: Self-pay

## 2016-11-08 DIAGNOSIS — K4090 Unilateral inguinal hernia, without obstruction or gangrene, not specified as recurrent: Secondary | ICD-10-CM

## 2016-11-08 NOTE — Telephone Encounter (Signed)
Referral placed as requested.

## 2016-11-22 ENCOUNTER — Ambulatory Visit (INDEPENDENT_AMBULATORY_CARE_PROVIDER_SITE_OTHER): Payer: Medicare HMO | Admitting: Podiatry

## 2016-11-22 ENCOUNTER — Encounter: Payer: Self-pay | Admitting: Podiatry

## 2016-11-22 VITALS — BP 112/64 | HR 79 | Temp 96.8°F | Resp 14

## 2016-11-22 DIAGNOSIS — M79676 Pain in unspecified toe(s): Secondary | ICD-10-CM | POA: Diagnosis not present

## 2016-11-22 DIAGNOSIS — E1140 Type 2 diabetes mellitus with diabetic neuropathy, unspecified: Secondary | ICD-10-CM

## 2016-11-22 DIAGNOSIS — L84 Corns and callosities: Secondary | ICD-10-CM | POA: Diagnosis not present

## 2016-11-22 DIAGNOSIS — L03032 Cellulitis of left toe: Secondary | ICD-10-CM

## 2016-11-22 DIAGNOSIS — L02612 Cutaneous abscess of left foot: Secondary | ICD-10-CM | POA: Diagnosis not present

## 2016-11-22 DIAGNOSIS — B351 Tinea unguium: Secondary | ICD-10-CM | POA: Diagnosis not present

## 2016-11-22 MED ORDER — DOXYCYCLINE HYCLATE 100 MG PO TABS: 100.0000 mg | ORAL_TABLET | Freq: Two times a day (BID) | ORAL | 0 refills | Status: DC

## 2016-11-22 NOTE — Progress Notes (Signed)
Patient ID: William Brewer, male   DOB: March 22, 1946, 70 y.o.   MRN: [id number] Complaint:  Visit Type: Patient returns to my office for continued preventative foot care services. Complaint: Patient states" my nails have grown long and thick and become painful to walk and wear shoes.   He says that his second toe has continued to be red and swollen but he does not feel pain due to his neuropathy. He says that he worked on his second toe, one week ago and has not worn a bandage over the toe since that time we did discuss the antibiotic from last visit and he says he took one a day. He presents the office today for preventative foot care services and evaluation of the ulcerated/infected second toe left foot .  Podiatric Exam: Vascular: dorsalis pedis and posterior tibial pulses are palpable bilateral. Capillary return is immediate. Temperature gradient is WNL. Skin turgor WNL  Sensorium:  Diminished  Semmes Weinstein monofilament test. Normal tactile sensation bilaterally. Nail Exam: Pt has thick disfigured discolored nails with subungual debris noted bilateral entire nail hallux through fifth toenails Ulcer Exam: There is no evidence of ulcer or pre-ulcerative changes or infection. Orthopedic Exam: Muscle tone and strength are WNL. No limitations in general ROM. No crepitus or effusions noted. Foot type and digits show no abnormalities. Mallet toe deformities second toes both feet. Skin: No Porokeratosis. No infection or ulcers. Distal clavi noted. Redness and swelling through dorsum  second toe left foot. Distal clavi tip second toe left foot.   There is 3 mm. X 3 mm. Ulcer distal aspect second toe left foot.  No drainage noted. Diagnosis:  Onychomycosis, , Pain in right toe, pain in left toes  Clavi second left foot  Cellulitis second toe left foot.  Treatment & Plan Procedures and Treatment: Consent by patient was obtained for treatment procedures. The patient understood the discussion of treatment and  procedures well. All questions were answered thoroughly reviewed. Debridement of mycotic and hypertrophic toenails, 1 through 5 bilateral and clearing of subungual debris.  Debride clavi.  Discussed the ulcer on the distal aspect of the second toe left foot with this patient. This area has been chronically infected and the toe has been red for the last few visits. I am concerned of the nonhealing at the site of this ulcer. The nail that was initially present has been replaced by ulcerations on the nailbed itself. I am concerned about possible infection to the distal phalanx the second toe, which keeps this area infected. He also says he is only been taking the medication 1 a day and not as directed. Therefore, at this visit, I proceeded to re-bandage with Neosporin and a dry sterile dressing. Home soaking instructions were given. His antibiotics were changed from cephalexin to doxycycline. He was also given an appointment for the [place] office so an x-ray can be taken to evaluate the status of the bone on the second toe left foot. He'll return the office in 10 days for reevaluation of the ulcer and x-rays at the [place] office.   [name] DPM Return Visit-Office Procedure: Patient instructed to return 9 weeks.months for continued evaluation and treatment.    [name] DPM

## 2016-11-29 DIAGNOSIS — K4090 Unilateral inguinal hernia, without obstruction or gangrene, not specified as recurrent: Secondary | ICD-10-CM | POA: Diagnosis not present

## 2016-12-01 ENCOUNTER — Ambulatory Visit (INDEPENDENT_AMBULATORY_CARE_PROVIDER_SITE_OTHER): Payer: Medicare HMO | Admitting: Podiatry

## 2016-12-01 ENCOUNTER — Ambulatory Visit (INDEPENDENT_AMBULATORY_CARE_PROVIDER_SITE_OTHER): Payer: Medicare HMO

## 2016-12-01 ENCOUNTER — Encounter: Payer: Self-pay | Admitting: Podiatry

## 2016-12-01 VITALS — Resp 14 | Wt 186.0 lb

## 2016-12-01 DIAGNOSIS — L03032 Cellulitis of left toe: Secondary | ICD-10-CM | POA: Diagnosis not present

## 2016-12-01 DIAGNOSIS — M79672 Pain in left foot: Secondary | ICD-10-CM

## 2016-12-01 DIAGNOSIS — R52 Pain, unspecified: Secondary | ICD-10-CM

## 2016-12-01 DIAGNOSIS — L84 Corns and callosities: Secondary | ICD-10-CM

## 2016-12-01 DIAGNOSIS — E1140 Type 2 diabetes mellitus with diabetic neuropathy, unspecified: Secondary | ICD-10-CM | POA: Diagnosis not present

## 2016-12-01 DIAGNOSIS — L02612 Cutaneous abscess of left foot: Secondary | ICD-10-CM | POA: Diagnosis not present

## 2016-12-01 NOTE — Progress Notes (Signed)
Patient ID: William Brewer, male   DOB: 1946/08/27, 70 y.o.   MRN: [id number] Complaint:  Visit Type: Patient returns to my office for xray second toe left foot.  He has had chronic infection each time he comes for preventive foot care services.  I scheduled him for an xray to check on bones in second toe.  His infection continues but he continues to remain active. He says the toe continues to be red and swollen even taking the doxycycline.    Podiatric Exam: Vascular: dorsalis pedis and posterior tibial pulses are palpable bilateral. Capillary return is immediate. Temperature gradient is WNL. Skin turgor WNL  Sensorium:  Diminished  Semmes Weinstein monofilament test. Normal tactile sensation bilaterally. Nail Exam: Pt has thick disfigured discolored nails with subungual debris noted bilateral entire nail hallux through fifth toenails Ulcer Exam: There is no evidence of ulcer or pre-ulcerative changes or infection. Orthopedic Exam: Muscle tone and strength are WNL. No limitations in general ROM. No crepitus or effusions noted. Foot type and digits show no abnormalities. Mallet toe deformities second toes both feet. Skin: No Porokeratosis. No infection or ulcers. Distal clavi noted. Redness and swelling through dorsum  second toe left foot. Distal clavi tip second toe left foot.   There is 3 mm. X 3 mm. Ulcer distal aspect second toe left foot.  No drainage noted. Diagnosis:  Diabetic ulcer second toe left foot.  Cellulitis second toe left foot.  Treatment & Plan Procedures and Treatment: Consent by patient was obtained for treatment procedures. The patient understood the discussion of treatment and procedures well. All questions were answered thoroughly reviewed. Xrays were taken second toe and reveal no bony changes to the distal phalanx second toe. Toe was rebandaged.  He was told to continue soaks andto bandage toe.  He was told to wear surgical shoe which may help the ulcer to close prior to hernia  surgery. Told him we need to continue antibiotics to help decrease and control the infection.  The toe is red and swollen but has improved this last week. If this condition worsens or becomes very painful, the patient was told to contact this office or go to the Emergency Department at the hospital.     Return Visit-Office Procedure: Patient instructed to return 9 weeks.months for continued evaluation and treatment.    [name] DPM

## 2016-12-10 ENCOUNTER — Other Ambulatory Visit: Payer: Self-pay | Admitting: Podiatry

## 2016-12-12 ENCOUNTER — Telehealth: Payer: Self-pay | Admitting: *Deleted

## 2016-12-12 MED ORDER — DOXYCYCLINE HYCLATE 100 MG PO TABS: 100.0000 mg | ORAL_TABLET | Freq: Two times a day (BID) | ORAL | 0 refills | Status: DC

## 2016-12-12 NOTE — Telephone Encounter (Signed)
I spoke with pt and informed that Dr. Burnell Blanks LOV note states if not improved to be reevaluated. Pt states he is some better on the doxycycline, I offered pt an appt, pt refused once transferred to schedulers. I told Dawn I would inform Dr. Prudence Davidson and call again.

## 2017-01-25 ENCOUNTER — Other Ambulatory Visit: Payer: Self-pay | Admitting: Podiatry

## 2017-02-05 DIAGNOSIS — K4090 Unilateral inguinal hernia, without obstruction or gangrene, not specified as recurrent: Secondary | ICD-10-CM | POA: Diagnosis not present

## 2017-02-14 ENCOUNTER — Other Ambulatory Visit: Payer: Medicare HMO

## 2017-02-21 ENCOUNTER — Ambulatory Visit: Payer: Medicare HMO | Admitting: Family Medicine

## 2017-02-22 ENCOUNTER — Ambulatory Visit (INDEPENDENT_AMBULATORY_CARE_PROVIDER_SITE_OTHER): Payer: Medicare HMO | Admitting: Podiatry

## 2017-02-22 ENCOUNTER — Encounter: Payer: Self-pay | Admitting: Podiatry

## 2017-02-22 DIAGNOSIS — G629 Polyneuropathy, unspecified: Secondary | ICD-10-CM | POA: Diagnosis not present

## 2017-02-22 DIAGNOSIS — M79676 Pain in unspecified toe(s): Secondary | ICD-10-CM

## 2017-02-22 DIAGNOSIS — B351 Tinea unguium: Secondary | ICD-10-CM

## 2017-02-22 DIAGNOSIS — Q828 Other specified congenital malformations of skin: Secondary | ICD-10-CM

## 2017-02-22 DIAGNOSIS — L84 Corns and callosities: Secondary | ICD-10-CM

## 2017-02-22 NOTE — Progress Notes (Signed)
Patient ID: William Brewer, male   DOB: Aug 10, 1946, 71 y.o.   MRN: [id number] Complaint:  Visit Type: Patient returns to my office for continued preventative foot care services. Complaint: Patient states" my nails have grown long and thick and become painful to walk and wear shoes" Patient has been diagnosed with DM with no foot complications.  He has peripheral neuropathy.The patient presents for preventative foot care services. No changes to ROS.  He says his second toe left foot has improved and swelling and redness has subsided despite still being present.  Podiatric Exam: Vascular: dorsalis pedis and posterior tibial pulses are palpable bilateral. Capillary return is immediate. Temperature gradient is WNL. Skin turgor WNL  Sensorium:  Diminished  Semmes Weinstein monofilament test. Normal tactile sensation bilaterally. Nail Exam: Pt has thick disfigured discolored nails with subungual debris noted bilateral entire nail hallux through fifth toenails Ulcer Exam: There is no evidence of ulcer or pre-ulcerative changes or infection. Orthopedic Exam: Muscle tone and strength are WNL. No limitations in general ROM. No crepitus or effusions noted. Foot type and digits show no abnormalities. Bony prominences are unremarkable. Skin: No Porokeratosis. Distal clavicle I noted second toe left. Redness and swelling through the dorsum of the second toe left foot persists, but has improved. Distal clavi second toe is covered with hypertropic tissue with a 1 mm x 1 mm ulcer. No malodor is noted  Diagnosis:  Onychomycosis, , Pain in right toe, pain in left toes,  Clavi/ulcer second toe left  Treatment & Plan Procedures and Treatment: Consent by patient was obtained for treatment procedures. The patient understood the discussion of treatment and procedures well. All questions were answered thoroughly reviewed. Debridement of mycotic and hypertrophic toenails, 1 through 5 bilateral and clearing of subungual debris.  No ulceration, no infection noted. Debride clavi.  Neosporin/DSD. Return Visit-Office Procedure: Patient instructed to return to the office for a follow up visit 9 weeks. for continued evaluation and treatment.    [name] DPM

## 2017-03-01 ENCOUNTER — Ambulatory Visit: Payer: Medicare HMO | Admitting: Podiatry

## 2017-03-08 ENCOUNTER — Other Ambulatory Visit (INDEPENDENT_AMBULATORY_CARE_PROVIDER_SITE_OTHER): Payer: Medicare HMO

## 2017-03-08 DIAGNOSIS — E119 Type 2 diabetes mellitus without complications: Secondary | ICD-10-CM | POA: Diagnosis not present

## 2017-03-08 LAB — BASIC METABOLIC PANEL
BUN: 30 mg/dL — ABNORMAL HIGH (ref 6–23)
CHLORIDE: 101 meq/L (ref 96–112)
CO2: 26 meq/L (ref 19–32)
Calcium: 10 mg/dL (ref 8.4–10.5)
Creatinine, Ser: 1.15 mg/dL (ref 0.40–1.50)
GFR: 66.73 mL/min (ref >60.00)
GLUCOSE: 114 mg/dL — AB (ref 70–99)
POTASSIUM: 3.9 meq/L (ref 3.5–5.1)
SODIUM: 141 meq/L (ref 135–145)

## 2017-03-08 LAB — HEMOGLOBIN A1C: Hgb A1c MFr Bld: 6.6 % — ABNORMAL HIGH (ref 4.6–6.5)

## 2017-03-08 LAB — LDL CHOLESTEROL, DIRECT: Direct LDL: 117 mg/dL

## 2017-03-15 ENCOUNTER — Encounter: Payer: Self-pay | Admitting: Family Medicine

## 2017-03-15 ENCOUNTER — Ambulatory Visit (INDEPENDENT_AMBULATORY_CARE_PROVIDER_SITE_OTHER): Payer: Medicare HMO | Admitting: Family Medicine

## 2017-03-15 VITALS — BP 102/58 | HR 77 | Temp 97.8°F | Ht 72.0 in | Wt 189.4 lb

## 2017-03-15 DIAGNOSIS — E119 Type 2 diabetes mellitus without complications: Secondary | ICD-10-CM

## 2017-03-15 DIAGNOSIS — Z0000 Encounter for general adult medical examination without abnormal findings: Secondary | ICD-10-CM | POA: Diagnosis not present

## 2017-03-15 DIAGNOSIS — E785 Hyperlipidemia, unspecified: Secondary | ICD-10-CM

## 2017-03-15 DIAGNOSIS — I10 Essential (primary) hypertension: Secondary | ICD-10-CM

## 2017-03-15 NOTE — Patient Instructions (Addendum)
No changes today  Could consider gabapentin at a later date  Could consider EkG at future visit, definitely need to check in if any symptoms  Get back to work on the diet/exercise  Increase lovastatin to 2 days a week   William Brewer , Thank you for taking time to come for your Medicare Wellness Visit. I appreciate your ongoing commitment to your health goals. Please review the following plan we discussed and let me know if I can assist you in the future.   Prevention of falls: Remove rugs or any tripping hazards in the home Use Non slip mats in bathtubs and showers Placing grab bars next to the toilet and or shower Placing handrails on both sides of the stair way Adding extra lighting in the home.   Personal safety issues reviewed:  1. Consider starting a community watch program per [facility] 2.  Changes batteries is smoke detector and/or carbon monoxide detector  3.  If you have firearms; keep them in a safe place 4.  Wear protection when in the sun; Always wear sunscreen or a hat; It is good to have your doctor check your skin annually or review any new areas of concern 5. Driving safety; Keep in the right lane; stay 3 car lengths behind the car in front of you on the highway; look 3 times prior to pulling out; carry your cell phone everywhere you go!    Learn about the Yellow Dot program:  The program allows first responders at your emergency to have access to who your physician is, as well as your medications and medical conditions.  Citizens requesting the Yellow Dot Packages should contact Master Corporal Nunzio Cobbs at the [facility] [phone] for the first week of the program and beginning the week after Easter citizens should contact their Scientist, physiological.      These are the goals we discussed: Goals    . patient          Maintain your health!       This is a list of the screening recommended for you and due  dates:  Health Maintenance  Topic Date Due  . Complete foot exam   10/27/2016  . Hemoglobin A1C  09/08/2017  . Eye exam for diabetics  09/28/2017  . Tetanus Vaccine  05/26/2018  . Colon Cancer Screening  08/30/2026  . Flu Shot  Completed  .  Hepatitis C: One time screening is recommended by Center for Disease Control  (CDC) for  adults born from [birth year] through 1965.   Completed  . Pneumonia vaccines  Completed    Health Maintenance, Male A healthy lifestyle and preventive care is important for your health and wellness. Ask your health care provider about what schedule of regular examinations is right for you. What should I know about weight and diet?  Eat a Healthy Diet  Eat plenty of vegetables, fruits, whole grains, low-fat dairy products, and lean protein.  Do not eat a lot of foods high in solid fats, added sugars, or salt. Maintain a Healthy Weight  Regular exercise can help you achieve or maintain a healthy weight. You should:  Do at least 150 minutes of exercise each week. The exercise should increase your heart rate and make you sweat (moderate-intensity exercise).  Do strength-training exercises at least twice a week. Watch Your Levels of Cholesterol and Blood Lipids  Have your blood tested for lipids and cholesterol every 5 years starting at 71  years of age. If you are at high risk for heart disease, you should start having your blood tested when you are 71 years old. You may need to have your cholesterol levels checked more often if:  Your lipid or cholesterol levels are high.  You are older than 71 years of age.  You are at high risk for heart disease. What should I know about cancer screening? Many types of cancers can be detected early and may often be prevented. Lung Cancer  You should be screened every year for lung cancer if:  You are a current smoker who has smoked for at least 30 years.  You are a former smoker who has quit within the past 15  years.  Talk to your health care provider about your screening options, when you should start screening, and how often you should be screened. Colorectal Cancer  Routine colorectal cancer screening usually begins at 71 years of age and should be repeated every 5-10 years until you are 71 years old. You may need to be screened more often if early forms of precancerous polyps or small growths are found. Your health care provider may recommend screening at an earlier age if you have risk factors for colon cancer.  Your health care provider may recommend using home test kits to check for hidden blood in the stool.  A small camera at the end of a tube can be used to examine your colon (sigmoidoscopy or colonoscopy). This checks for the earliest forms of colorectal cancer. Prostate and Testicular Cancer  Depending on your age and overall health, your health care provider may do certain tests to screen for prostate and testicular cancer.  Talk to your health care provider about any symptoms or concerns you have about testicular or prostate cancer. Skin Cancer  Check your skin from head to toe regularly.  Tell your health care provider about any new moles or changes in moles, especially if:  There is a change in a mole's size, shape, or color.  You have a mole that is larger than a pencil eraser.  Always use sunscreen. Apply sunscreen liberally and repeat throughout the day.  Protect yourself by wearing long sleeves, pants, a wide-brimmed hat, and sunglasses when outside. What should I know about heart disease, diabetes, and high blood pressure?  If you are 42-70 years of age, have your blood pressure checked every 3-5 years. If you are 58 years of age or older, have your blood pressure checked every year. You should have your blood pressure measured twice-once when you are at a hospital or clinic, and once when you are not at a hospital or clinic. Record the average of the two measurements. To  check your blood pressure when you are not at a hospital or clinic, you can use:  An automated blood pressure machine at a pharmacy.  A home blood pressure monitor.  Talk to your health care provider about your target blood pressure.  If you are between 71-104 years old, ask your health care provider if you should take aspirin to prevent heart disease.  Have regular diabetes screenings by checking your fasting blood sugar level.  If you are at a normal weight and have a low risk for diabetes, have this test once every three years after the age of 45.  If you are overweight and have a high risk for diabetes, consider being tested at a younger age or more often.  A one-time screening for abdominal aortic aneurysm (AAA)  by ultrasound is recommended for men aged 18-75 years who are current or former smokers. What should I know about preventing infection? Hepatitis B  If you have a higher risk for hepatitis B, you should be screened for this virus. Talk with your health care provider to find out if you are at risk for hepatitis B infection. Hepatitis C  Blood testing is recommended for:  Everyone born from [birth year] through 1965.  Anyone with known risk factors for hepatitis C. Sexually Transmitted Diseases (STDs)  You should be screened each year for STDs including gonorrhea and chlamydia if:  You are sexually active and are younger than 71 years of age.  You are older than 71 years of age and your health care provider tells you that you are at risk for this type of infection.  Your sexual activity has changed since you were last screened and you are at an increased risk for chlamydia or gonorrhea. Ask your health care provider if you are at risk.  Talk with your health care provider about whether you are at high risk of being infected with HIV. Your health care provider may recommend a prescription medicine to help prevent HIV infection. What else can I do?  Schedule regular health,  dental, and eye exams.  Stay current with your vaccines (immunizations).  Do not use any tobacco products, such as cigarettes, chewing tobacco, and e-cigarettes. If you need help quitting, ask your health care provider.  Limit alcohol intake to no more than 2 drinks per day. One drink equals 12 ounces of beer, 5 ounces of wine, or 1 ounces of hard liquor.  Do not use street drugs.  Do not share needles.  Ask your health care provider for help if you need support or information about quitting drugs.  Tell your health care provider if you often feel depressed.  Tell your health care provider if you have ever been abused or do not feel safe at home. This information is not intended to replace advice given to you by your health care provider. Make sure you discuss any questions you have with your health care provider. Document Released: 06/08/2008 Document Revised: 08/09/2016 Document Reviewed: 09/14/2015 Elsevier Interactive Patient Education  2017 [employer] Prevention in the Home Falls can cause injuries and can affect people from all age groups. There are many simple things that you can do to make your home safe and to help prevent falls. What can I do on the outside of my home?  Regularly repair the edges of walkways and driveways and fix any cracks.  Remove high doorway thresholds.  Trim any shrubbery on the main path into your home.  Use bright outdoor lighting.  Clear walkways of debris and clutter, including tools and rocks.  Regularly check that handrails are securely fastened and in good repair. Both sides of any steps should have handrails.  Install guardrails along the edges of any raised decks or porches.  Have leaves, snow, and ice cleared regularly.  Use sand or salt on walkways during winter months.  In the garage, clean up any spills right away, including grease or oil spills. What can I do in the bathroom?  Use night lights.  Install grab  bars by the toilet and in the tub and shower. Do not use towel bars as grab bars.  Use non-skid mats or decals on the floor of the tub or shower.  If you need to sit down while you are in the  shower, use a plastic, non-slip stool.  Keep the floor dry. Immediately clean up any water that spills on the floor.  Remove soap buildup in the tub or shower on a regular basis.  Attach bath mats securely with double-sided non-slip rug tape.  Remove throw rugs and other tripping hazards from the floor. What can I do in the bedroom?  Use night lights.  Make sure that a bedside light is easy to reach.  Do not use oversized bedding that drapes onto the floor.  Have a firm chair that has side arms to use for getting dressed.  Remove throw rugs and other tripping hazards from the floor. What can I do in the kitchen?  Clean up any spills right away.  Avoid walking on wet floors.  Place frequently used items in easy-to-reach places.  If you need to reach for something above you, use a sturdy step stool that has a grab bar.  Keep electrical cables out of the way.  Do not use floor polish or wax that makes floors slippery. If you have to use wax, make sure that it is non-skid floor wax.  Remove throw rugs and other tripping hazards from the floor. What can I do in the stairways?  Do not leave any items on the stairs.  Make sure that there are handrails on both sides of the stairs. Fix handrails that are broken or loose. Make sure that handrails are as long as the stairways.  Check any carpeting to make sure that it is firmly attached to the stairs. Fix any carpet that is loose or worn.  Avoid having throw rugs at the top or bottom of stairways, or secure the rugs with carpet tape to prevent them from moving.  Make sure that you have a light switch at the top of the stairs and the bottom of the stairs. If you do not have them, have them installed. What are some other fall prevention  tips?  Wear closed-toe shoes that fit well and support your feet. Wear shoes that have rubber soles or low heels.  When you use a stepladder, make sure that it is completely opened and that the sides are firmly locked. Have someone hold the ladder while you are using it. Do not climb a closed stepladder.  Add color or contrast paint or tape to grab bars and handrails in your home. Place contrasting color strips on the first and last steps.  Use mobility aids as needed, such as canes, walkers, scooters, and crutches.  Turn on lights if it is dark. Replace any light bulbs that burn out.  Set up furniture so that there are clear paths. Keep the furniture in the same spot.  Fix any uneven floor surfaces.  Choose a carpet design that does not hide the edge of steps of a stairway.  Be aware of any and all pets.  Review your medicines with your healthcare provider. Some medicines can cause dizziness or changes in blood pressure, which increase your risk of falling. Talk with your health care provider about other ways that you can decrease your risk of falls. This may include working with a physical therapist or trainer to improve your strength, balance, and endurance. This information is not intended to replace advice given to you by your health care provider. Make sure you discuss any questions you have with your health care provider. Document Released: 12/01/2002 Document Revised: 05/09/2016 Document Reviewed: 01/15/2015 Elsevier Interactive Patient Education  2017 [employer].

## 2017-03-15 NOTE — Progress Notes (Signed)
Subjective:  William Brewer is a 71 y.o. year old very pleasant male patient who presents for/with See problem oriented charting ROS- No chest pain or shortness of breath. No headache or blurry vision.    Past Medical History-  Patient Active Problem List   Diagnosis Date Noted  . Diabetes mellitus type II, controlled ([place]) 09/23/2013    Priority: High  . Idiopathic peripheral neuropathy 01/27/2009    Priority: High  . Right groin hernia 10/24/2016    Priority: Medium  . Essential hypertension 03/26/2008    Priority: Medium  . Hyperlipidemia 06/21/2007    Priority: Medium  . Basal cell carcinoma of skin 10/28/2015    Priority: Low  . Fever blister 08/10/2015    Priority: Low  . History of colonic polyps 06/21/2007    Priority: Low    Medications- reviewed and updated Current Outpatient Prescriptions  Medication Sig Dispense Refill  . ALPHA LIPOIC ACID PO Take 1 tablet by mouth daily.      . Ascorbic Acid (VITAMIN C ER PO) Take by mouth.    Marland Kitchen aspirin 81 MG tablet Take 81 mg by mouth daily.      . Cholecalciferol (VITAMIN D) 2000 UNITS tablet Take 2,000 Units by mouth daily.      Marland Kitchen lisinopril-hydrochlorothiazide (PRINZIDE,ZESTORETIC) 20-12.5 MG tablet TAKE ONE TABLET BY MOUTH ONCE DAILY 90 tablet 4  . lovastatin (MEVACOR) 20 MG tablet Take 1 tablet (20 mg total) by mouth at bedtime. 31 tablet 5  . Lysine HCl 1000 MG TABS Take 1 tablet by mouth daily.      . magnesium 30 MG tablet Take 250 mg by mouth daily.    . Melatonin 3 MG CAPS Take by mouth as needed.    . metFORMIN (GLUCOPHAGE) 500 MG tablet Take 1 tablet (500 mg total) by mouth 2 (two) times daily with a meal. 180 tablet 0  . Multiple Vitamin (MULTIVITAMIN) tablet Take 1 tablet by mouth daily.      . NON FORMULARY Legatrin PM-Take one daily    . SUPER B COMPLEX/C PO Take by mouth daily.    . valACYclovir (VALTREX) 1000 MG tablet TAKE TWO TABLETS BY MOUTH TWICE DAILY FOR 1 DAY AS NEEDED 20 tablet 3   No current  facility-administered medications for this visit.     Objective: BP (!) 102/58 (BP Location: Left Arm, Patient Position: Sitting, Cuff Size: Large)   Pulse 77   Temp 97.8 F (36.6 C) (Oral)   Ht 6' (1.829 m)   Wt 189 lb 6.4 oz (85.9 kg)   SpO2 95%   BMI 25.69 kg/m  Gen: NAD, resting comfortably CV: RRR no murmurs rubs or gallops Lungs: CTAB no crackles, wheeze, rhonchi Ext: no edema Skin: warm, dry  Diabetic Foot Exam - Simple   Simple Foot Form Diabetic Foot exam was performed with the following findings:  Yes 03/15/2017 10:02 AM  Visual Inspection No deformities, no ulcerations, no other skin breakdown bilaterally:  Yes Sensation Testing Intact to touch and monofilament testing bilaterally:  Yes Pulse Check Posterior Tibialis and Dorsalis pulse intact bilaterally:  Yes Comments    Assessment/Plan:  Diabetes mellitus type II, controlled ([place]) S: well controlled. On metformin 500mg  BID. Has his baseline neuropathy untreated- predated his diabetes Exercise and diet- after surgery hernia working on getting back towalking at least 5 days a week for 30 minutes or pickleball Lab Results  Component Value Date   HGBA1C 6.6 (H) 03/08/2017   HGBA1C 6.0 06/15/2016  HGBA1C 6.3 02/01/2016   A/P: unchanged- push lifestyle and continue metformin  Hyperlipidemia S: poorly controlled. Last visit trialed lovastatin 20mg  once a week- no significant change in lipids. No myalgias.  Lab Results  Component Value Date   CHOL 190 06/15/2016   HDL 34.60 (L) 06/15/2016   LDLCALC 118 (H) 06/15/2016   LDLDIRECT 117.0 03/08/2017   TRIG 188.0 (H) 06/15/2016   CHOLHDL 6 06/15/2016   A/P: will trial twice a week  Essential hypertension S: controlled on  lisinopril 20-12.5mg .  BP Readings from Last 3 Encounters:  03/15/17 (!) 102/58  11/22/16 112/64  10/24/16 110/78  A/P:Continue current meds:  Doing well  4 months verbal  Return precautions advised.  Garret Reddish,  MD

## 2017-03-15 NOTE — Assessment & Plan Note (Signed)
S: well controlled. On metformin 500mg  BID. Has his baseline neuropathy untreated- predated his diabetes Exercise and diet- after surgery hernia working on getting back towalking at least 5 days a week for 30 minutes or pickleball Lab Results  Component Value Date   HGBA1C 6.6 (H) 03/08/2017   HGBA1C 6.0 06/15/2016   HGBA1C 6.3 02/01/2016   A/P: unchanged- push lifestyle and continue metformin

## 2017-03-15 NOTE — Assessment & Plan Note (Signed)
S: poorly controlled. Last visit trialed lovastatin 20mg  once a week- no significant change in lipids. No myalgias.  Lab Results  Component Value Date   CHOL 190 06/15/2016   HDL 34.60 (L) 06/15/2016   LDLCALC 118 (H) 06/15/2016   LDLDIRECT 117.0 03/08/2017   TRIG 188.0 (H) 06/15/2016   CHOLHDL 6 06/15/2016   A/P: will trial twice a week

## 2017-03-15 NOTE — Assessment & Plan Note (Signed)
S: controlled on  lisinopril 20-12.5mg .  BP Readings from Last 3 Encounters:  03/15/17 (!) 102/58  11/22/16 112/64  10/24/16 110/78  A/P:Continue current meds:  Doing well

## 2017-03-15 NOTE — Progress Notes (Signed)
Subjective:   William Brewer is a 71 y.o. male who presents for Medicare Annual/Subsequent preventive examination.  HRA assessment completed during this visit William Brewer The Patient was informed that the wellness visit is to identify future health risk and educate and initiate measures that can reduce risk for increased disease through the lifespan.    NO ROS; Medicare Wellness Visit Describes health as good, fair or great; great    Psychosocial -  Support Living situation;  Married since 1973;  Dtr local; son [place] Active with [employer]   Family hx; DVT; cancer and HD  Meds  Primary Prevention Tobacco never smoked  ETOH: on average; 3 a week  LDL 117 02/2017 / HDL 34 in 05/2016 trig 188  States his mother had high Triglycerides  DM; glucose 114 and A1c 6.6 02/2017  Diet   Tries to stay under 2500 calories a day Doesn't eat after 7:30 at hs  BMI 26   Exercise Exercise 25 minutes a day;  Walking  Plays pickle ball; 2 days generally for a couple of hours Did play  tennis   Stressors none; states he is retired; No issues  Safety lives mainly on the 1st floor Added grab bar to shower  Fall hx; no  Fear of falling is careful with neuropathy but does well playing pickle ball with lateral movement   Given information on Community safety; driving safety, sun protection, firearm safety, smoke detectors as well as the "yellow dot" program for residents in [place].   Patient Care Team: Marin Olp, MD as PCP - General (Family Medicine)    Hearing Screening   125Hz  250Hz  500Hz  1000Hz  2000Hz  3000Hz  4000Hz  6000Hz  8000Hz   Right ear:     100      Left ear:     100      Vision Screening Comments: Vision;  DM check  Checked with in last 2 months Dr. Alois Cliche   Diabetic eye exam 09/2016 No retinopathy Call to Dr. Alois Cliche and did confirm eye exam and confirmed dilated eye exam completed with no complications Will update metric   Do you  have little interest or pleasure in doing things? no Have you been feeling down, depressed, hopeless?  no PHQ9 waived or completed    Ad8 score reviewed for issues;  Issues making decisions; no  Less interest in hobbies / activities" no  Repeats questions, stories; family complaining: NO  Trouble using ordinary gadgets; microwave; computer: no  Forgets the month or year: no  Mismanaging finances: no  Missing apt: no but does write them down  Daily problems with thinking of memory NO Ad8 score is 0  MMSE not appropriate unless AD8 score is > 2   Advanced Directive completed   HM due  Foot exam-just seen podiatry 11/2016  States Dr hunter checked his feet; toe issues resolving and see podiatry regularly   Colonoscopy 08/2016  PSA deferred to    Cardiac Risk Factors include: advanced age (>32men, >58 women);diabetes mellitus;dyslipidemia;hypertension;male gender     Objective:    Vitals: BP (!) 102/58 (BP Location: Left Arm, Patient Position: Sitting, Cuff Size: Large)   Pulse 77   Temp 97.8 F (36.6 C) (Oral)   Ht 6' (1.829 m)   Wt 189 lb 6.4 oz (85.9 kg)   SpO2 95%   BMI 25.69 kg/m   Body mass index is 25.69 kg/m.  Tobacco History  Smoking Status  . Never Smoker  Smokeless  Tobacco  . Never Used     Counseling given: Yes   Past Medical History:  Diagnosis Date  . Allergy   . Basal cell carcinoma    leg  . Colon polyps   . Diabetes mellitus without complication ([place])   . DIVERTICULOSIS, COLON 05/26/2008  . Hyperlipidemia    refused treatment  . Hypertension   . Peripheral neuropathy [facility])    Past Surgical History:  Procedure Laterality Date  . APPENDECTOMY    . COLONOSCOPY    . POLYPECTOMY     Family History  Problem Relation Age of Onset  . Deep vein thrombosis Mother     cancer  . Cancer Mother     intestine  . Heart disease Father 70    CABG, former smoker but not heavy  . Hypertension Neg Hx   . Colon cancer Neg Hx    History    Sexual Activity  . Sexual activity: Not on file    Outpatient Encounter Prescriptions as of 03/15/2017  Medication Sig  . ALPHA LIPOIC ACID PO Take 1 tablet by mouth daily.    . Ascorbic Acid (VITAMIN C ER PO) Take by mouth.  Marland Kitchen aspirin 81 MG tablet Take 81 mg by mouth daily.    . Cholecalciferol (VITAMIN D) 2000 UNITS tablet Take 2,000 Units by mouth daily.    Marland Kitchen lisinopril-hydrochlorothiazide (PRINZIDE,ZESTORETIC) 20-12.5 MG tablet TAKE ONE TABLET BY MOUTH ONCE DAILY  . lovastatin (MEVACOR) 20 MG tablet Take 1 tablet (20 mg total) by mouth at bedtime.  Marland Kitchen Lysine HCl 1000 MG TABS Take 1 tablet by mouth daily.    . magnesium 30 MG tablet Take 250 mg by mouth daily.  . Melatonin 3 MG CAPS Take by mouth as needed.  . metFORMIN (GLUCOPHAGE) 500 MG tablet Take 1 tablet (500 mg total) by mouth 2 (two) times daily with a meal.  . Multiple Vitamin (MULTIVITAMIN) tablet Take 1 tablet by mouth daily.    . NON FORMULARY Legatrin PM-Take one daily  . SUPER B COMPLEX/C PO Take by mouth daily.  . valACYclovir (VALTREX) 1000 MG tablet TAKE TWO TABLETS BY MOUTH TWICE DAILY FOR 1 DAY AS NEEDED  . [DISCONTINUED] doxycycline (VIBRA-TABS) 100 MG tablet TAKE ONE TABLET BY MOUTH TWICE DAILY  . [DISCONTINUED] 0.9 %  sodium chloride infusion    No facility-administered encounter medications on file as of 03/15/2017.     Activities of Daily Living In your present state of health, do you have any difficulty performing the following activities: 03/15/2017  Hearing? N  Vision? N  Difficulty concentrating or making decisions? N  Walking or climbing stairs? N  Dressing or bathing? N  Doing errands, shopping? N  Preparing Food and eating ? N  Using the Toilet? N  In the past six months, have you accidently leaked urine? N  Do you have problems with loss of bowel control? N  Managing your Medications? N  Managing your Finances? N  Housekeeping or managing your Housekeeping? N  Some recent data might be hidden     Patient Care Team: Marin Olp, MD as PCP - General (Family Medicine)   Assessment:     Exercise Activities and Dietary recommendations Current Exercise Habits: Home exercise routine;Structured exercise class, Time (Minutes): > 60, Frequency (Times/Week): 4, Weekly Exercise (Minutes/Week): 0, Intensity: Moderate  Goals    . patient          Maintain your health!      Fall Risk Fall  Risk  03/15/2017 03/15/2017 04/29/2015 09/23/2013  Falls in the past year? No No No No   Depression Screen PHQ 2/9 Scores 03/15/2017 03/15/2017 04/29/2015 09/23/2013  PHQ - 2 Score 0 0 0 0    Cognitive Function Score 0      6CIT Screen 03/15/2017  What Year? (No Data)    Immunization History  Administered Date(s) Administered  . Influenza, High Dose Seasonal PF 10/24/2016  . Influenza,inj,Quad PF,36+ Mos 09/23/2013, 11/27/2014, 10/28/2015  . Pneumococcal Conjugate-13 04/29/2015  . Pneumococcal Polysaccharide-23 09/20/2012  . Td 05/26/2008  . Tdap 05/26/2008  . Zoster 09/20/2012   Screening Tests Health Maintenance  Topic Date Due  . HEMOGLOBIN A1C  09/08/2017  . OPHTHALMOLOGY EXAM  09/28/2017  . FOOT EXAM  03/15/2018  . TETANUS/TDAP  05/26/2018  . COLONOSCOPY  08/30/2026  . INFLUENZA VACCINE  Completed  . Hepatitis C Screening  Completed  . PNA vac Low Risk Adult  Completed      Plan:      PCP Notes  Health Maintenance Dilated eye exam confirmed 09/2016 and will update metric  Abnormal Screens  Hearing 2000 in both eyes Educated regarding hearing screen but is not having any day to day issues   Referrals none  Patient concerns; none  Nurse Concerns; none  Next PCP apt today    During the course of the visit the patient was educated and counseled about the following appropriate screening and preventive services:   Vaccines to include Pneumoccal, Influenza, Hepatitis B, Td, Zostavax, HCV  Electrocardiogram  Cardiovascular Disease  Colorectal cancer screening  08/2016 and was his last   Diabetes screening ongoing  Prostate Cancer Screening per Dr. Yong Channel   Glaucoma screening neg 09/2016  Nutrition counseling; good   Smoking cessation counseling n/a  Patient Instructions (the written plan) was given to the patient.    Wynetta Fines, RN  03/15/2017

## 2017-03-15 NOTE — Progress Notes (Signed)
Pre visit review using our clinic review tool, if applicable. No additional management support is needed unless otherwise documented below in the visit note. 

## 2017-04-27 ENCOUNTER — Ambulatory Visit: Payer: Medicare HMO | Admitting: Podiatry

## 2017-06-21 ENCOUNTER — Other Ambulatory Visit: Payer: Self-pay | Admitting: Family Medicine

## 2017-07-25 DIAGNOSIS — H02054 Trichiasis without entropian left upper eyelid: Secondary | ICD-10-CM | POA: Diagnosis not present

## 2017-07-29 ENCOUNTER — Other Ambulatory Visit: Payer: Self-pay | Admitting: Family Medicine

## 2017-08-30 ENCOUNTER — Ambulatory Visit (INDEPENDENT_AMBULATORY_CARE_PROVIDER_SITE_OTHER): Payer: Medicare HMO

## 2017-08-30 ENCOUNTER — Encounter: Payer: Self-pay | Admitting: Podiatry

## 2017-08-30 ENCOUNTER — Ambulatory Visit (INDEPENDENT_AMBULATORY_CARE_PROVIDER_SITE_OTHER): Payer: Medicare HMO | Admitting: Podiatry

## 2017-08-30 VITALS — BP 110/65 | HR 76 | Resp 16

## 2017-08-30 DIAGNOSIS — IMO0001 Reserved for inherently not codable concepts without codable children: Secondary | ICD-10-CM

## 2017-08-30 DIAGNOSIS — T814XXD Infection following a procedure, subsequent encounter: Secondary | ICD-10-CM

## 2017-08-30 DIAGNOSIS — M79676 Pain in unspecified toe(s): Secondary | ICD-10-CM

## 2017-08-30 DIAGNOSIS — B351 Tinea unguium: Secondary | ICD-10-CM | POA: Diagnosis not present

## 2017-08-30 DIAGNOSIS — G629 Polyneuropathy, unspecified: Secondary | ICD-10-CM

## 2017-08-30 MED ORDER — CEPHALEXIN 500 MG PO CAPS: 500.0000 mg | ORAL_CAPSULE | Freq: Two times a day (BID) | ORAL | 1 refills | Status: DC

## 2017-08-30 NOTE — Progress Notes (Signed)
Subjective:    Patient ID: William Brewer, male   DOB: 71 y.o.   MRN: [id number]   HPI patient presents with chronic lesion of the left plantar hallux and states he also needs to get his nails trimmed as they are thick has neuropathy and they can become painful with shoe gear    ROS      Objective:  Physical Exam neurovascular status unchanged from previous visit with 2 month history of breakdown of tissue left plantar hallux with small ulceration measuring approximate 5 mm x 5 mm with 3 mm in depth with no subcutaneous exposure and no proximal edema erythema drainage noted. Also noted to have thick nails 1-5 both feet that are incurvated and can become painful when pressed and become too long     Assessment:   At risk patient with significant neuropathic like symptomatology with ulceration left plantar hallux in 2 months duration with mild redness of the hallux and nail disease 1-5 both feet      Plan:   H&P x-rays reviewed and I went ahead and did sterile debridement of the area flushed the wound and applied Iodosorb with dressing and dispensed surgical shoe with redness and there is no weightbearing on the forefoot. Went ahead and placed him on cephalexin 500 mg twice a day gave instructions on soaks and how to take care of the wound and debridement nailbeds 1-5 both feet. Discussed at one point he may end up losing a portion of this toe and I reviewed his x-rays with him  X-rays at this time were negative for ostial lysis or indications of osteomyelitic process

## 2017-09-06 ENCOUNTER — Ambulatory Visit: Payer: Medicare HMO | Admitting: Family Medicine

## 2017-09-07 ENCOUNTER — Other Ambulatory Visit: Payer: Self-pay | Admitting: Family Medicine

## 2017-09-10 ENCOUNTER — Other Ambulatory Visit: Payer: Self-pay

## 2017-09-10 MED ORDER — METFORMIN HCL 500 MG PO TABS: 500.0000 mg | ORAL_TABLET | Freq: Two times a day (BID) | ORAL | 1 refills | Status: DC

## 2017-09-13 ENCOUNTER — Encounter: Payer: Self-pay | Admitting: Podiatry

## 2017-09-13 ENCOUNTER — Ambulatory Visit (INDEPENDENT_AMBULATORY_CARE_PROVIDER_SITE_OTHER): Payer: Medicare HMO | Admitting: Podiatry

## 2017-09-13 DIAGNOSIS — L97501 Non-pressure chronic ulcer of other part of unspecified foot limited to breakdown of skin: Secondary | ICD-10-CM | POA: Diagnosis not present

## 2017-09-13 DIAGNOSIS — G629 Polyneuropathy, unspecified: Secondary | ICD-10-CM

## 2017-09-13 NOTE — Progress Notes (Signed)
Subjective:    Patient ID: William Brewer, male   DOB: 71 y.o.   MRN: [id number]   HPI patient states have a small lesion plantar aspect left localized in nature and I still have some redness in the toe but it seems like it's getting better but I do have neuropathy    ROS      Objective:  Physical Exam neurovascular status unchanged from previous visit with keratotic lesion sub-left hallux that measures about 3 x 3 mm with 1 mm of depth and no indications of bone exposure significant 66 exposure with mild redness still on the hallux localized to the hallux with no proximal edema erythema     Assessment:   Localized ulceration left hallux which appears to be improving      Plan:   H&P reviewed condition and using sterile sharp instrumentation debris did lesion flushed and packed with Iodosorb and sterile dressing. Gave instructions on cushioning the toe and patient be seen back for [place] to recheck again in the next 4-6 weeks or earlier if any increased redness drainage or odor were to occur associated with this area

## 2017-09-25 DIAGNOSIS — D485 Neoplasm of uncertain behavior of skin: Secondary | ICD-10-CM | POA: Diagnosis not present

## 2017-09-25 DIAGNOSIS — Z85828 Personal history of other malignant neoplasm of skin: Secondary | ICD-10-CM | POA: Diagnosis not present

## 2017-09-25 DIAGNOSIS — L738 Other specified follicular disorders: Secondary | ICD-10-CM | POA: Diagnosis not present

## 2017-09-25 DIAGNOSIS — D1801 Hemangioma of skin and subcutaneous tissue: Secondary | ICD-10-CM | POA: Diagnosis not present

## 2017-09-25 DIAGNOSIS — L814 Other melanin hyperpigmentation: Secondary | ICD-10-CM | POA: Diagnosis not present

## 2017-09-25 DIAGNOSIS — L989 Disorder of the skin and subcutaneous tissue, unspecified: Secondary | ICD-10-CM | POA: Diagnosis not present

## 2017-09-25 DIAGNOSIS — L720 Epidermal cyst: Secondary | ICD-10-CM | POA: Diagnosis not present

## 2017-09-25 DIAGNOSIS — L821 Other seborrheic keratosis: Secondary | ICD-10-CM | POA: Diagnosis not present

## 2017-09-26 ENCOUNTER — Ambulatory Visit: Payer: Medicare HMO | Admitting: Family Medicine

## 2017-09-27 NOTE — Telephone Encounter (Signed)
Entered in error

## 2017-09-28 DIAGNOSIS — R238 Other skin changes: Secondary | ICD-10-CM | POA: Diagnosis not present

## 2017-10-26 ENCOUNTER — Telehealth: Payer: Self-pay

## 2017-10-26 NOTE — Telephone Encounter (Signed)
Patient is on the list for [employer] 2018 and may be a good candidate for an AWV. Please let me know if/when appt is scheduled.  Pt does have an appt coming up with PCP

## 2017-10-26 NOTE — Telephone Encounter (Signed)
Disregard.  error

## 2017-11-01 ENCOUNTER — Ambulatory Visit: Payer: Medicare HMO | Admitting: Family Medicine

## 2017-12-12 ENCOUNTER — Ambulatory Visit: Payer: Medicare HMO | Admitting: Family Medicine

## 2018-01-10 ENCOUNTER — Other Ambulatory Visit: Payer: Self-pay | Admitting: Family Medicine

## 2018-01-10 NOTE — Telephone Encounter (Signed)
Copied from [place] [id number]. Topic: Quick Communication - Rx Refill/Question >> Jan 10, 2018  3:29 PM Cleaster Corin, NT wrote: Medication: Valtrex 1000mg    Has the patient contacted their pharmacy? no   (Agent: If no, request that the patient contact the pharmacy for the refill.)   Preferred Pharmacy (with phone number or street name):[facility], [place] [address] Phone: [phone] Fax: [phone]      Agent: Please be advised that RX refills may take up to 3 business days. We ask that you follow-up with your pharmacy.

## 2018-01-11 MED ORDER — VALACYCLOVIR HCL 1 G PO TABS: ORAL_TABLET | ORAL | 3 refills | Status: DC

## 2018-01-11 NOTE — Telephone Encounter (Signed)
Dr. Yong Channel okay to refill Valtrex?

## 2018-02-11 DIAGNOSIS — H25013 Cortical age-related cataract, bilateral: Secondary | ICD-10-CM | POA: Diagnosis not present

## 2018-02-11 DIAGNOSIS — H2513 Age-related nuclear cataract, bilateral: Secondary | ICD-10-CM | POA: Diagnosis not present

## 2018-02-11 DIAGNOSIS — H40013 Open angle with borderline findings, low risk, bilateral: Secondary | ICD-10-CM | POA: Diagnosis not present

## 2018-02-11 DIAGNOSIS — E119 Type 2 diabetes mellitus without complications: Secondary | ICD-10-CM | POA: Diagnosis not present

## 2018-02-11 DIAGNOSIS — H11153 Pinguecula, bilateral: Secondary | ICD-10-CM | POA: Diagnosis not present

## 2018-02-11 DIAGNOSIS — H1859 Other hereditary corneal dystrophies: Secondary | ICD-10-CM | POA: Diagnosis not present

## 2018-02-11 LAB — HM DIABETES EYE EXAM

## 2018-02-12 ENCOUNTER — Encounter: Payer: Self-pay | Admitting: Family Medicine

## 2018-02-12 ENCOUNTER — Ambulatory Visit (INDEPENDENT_AMBULATORY_CARE_PROVIDER_SITE_OTHER): Payer: Medicare HMO | Admitting: Family Medicine

## 2018-02-12 VITALS — BP 110/78 | HR 68 | Ht 72.0 in | Wt 191.2 lb

## 2018-02-12 DIAGNOSIS — E119 Type 2 diabetes mellitus without complications: Secondary | ICD-10-CM | POA: Diagnosis not present

## 2018-02-12 DIAGNOSIS — R079 Chest pain, unspecified: Secondary | ICD-10-CM | POA: Diagnosis not present

## 2018-02-12 DIAGNOSIS — R5383 Other fatigue: Secondary | ICD-10-CM | POA: Diagnosis not present

## 2018-02-12 DIAGNOSIS — I10 Essential (primary) hypertension: Secondary | ICD-10-CM | POA: Diagnosis not present

## 2018-02-12 DIAGNOSIS — Z23 Encounter for immunization: Secondary | ICD-10-CM | POA: Diagnosis not present

## 2018-02-12 DIAGNOSIS — E785 Hyperlipidemia, unspecified: Secondary | ICD-10-CM

## 2018-02-12 LAB — CBC
HEMATOCRIT: 42.5 % (ref 39.0–52.0)
HEMOGLOBIN: 14.6 g/dL (ref 13.0–17.0)
MCHC: 34.3 g/dL (ref 30.0–36.0)
MCV: 91.5 fl (ref 78.0–100.0)
Platelets: 189 10*3/uL (ref 150.0–400.0)
RBC: 4.65 Mil/uL (ref 4.22–5.81)
RDW: 13.4 % (ref 11.5–15.5)
WBC: 5.4 10*3/uL (ref 4.0–10.5)

## 2018-02-12 LAB — COMPREHENSIVE METABOLIC PANEL
ALT: 31 U/L (ref 0–53)
AST: 23 U/L (ref 0–37)
Albumin: 4.4 g/dL (ref 3.5–5.2)
Alkaline Phosphatase: 56 U/L (ref 39–117)
BUN: 25 mg/dL — AB (ref 6–23)
CHLORIDE: 103 meq/L (ref 96–112)
CO2: 28 mEq/L (ref 19–32)
Calcium: 9.7 mg/dL (ref 8.4–10.5)
Creatinine, Ser: 1.12 mg/dL (ref 0.40–1.50)
GFR: 68.62 mL/min (ref >60.00)
GLUCOSE: 123 mg/dL — AB (ref 70–99)
POTASSIUM: 4 meq/L (ref 3.5–5.1)
SODIUM: 138 meq/L (ref 135–145)
Total Bilirubin: 0.6 mg/dL (ref 0.2–1.2)
Total Protein: 6.9 g/dL (ref 6.0–8.3)

## 2018-02-12 LAB — LIPID PANEL
CHOLESTEROL: 150 mg/dL (ref 0–200)
HDL: 28 mg/dL — ABNORMAL LOW (ref >39.00)
NonHDL: 121.84
Total CHOL/HDL Ratio: 5
Triglycerides: 237 mg/dL — ABNORMAL HIGH (ref 0.0–149.0)
VLDL: 47.4 mg/dL — AB (ref 0.0–40.0)

## 2018-02-12 LAB — TSH: TSH: 3.89 u[IU]/mL (ref 0.35–4.50)

## 2018-02-12 LAB — LDL CHOLESTEROL, DIRECT: LDL DIRECT: 91 mg/dL

## 2018-02-12 LAB — HEMOGLOBIN A1C: HEMOGLOBIN A1C: 7.3 % — AB (ref 4.6–6.5)

## 2018-02-12 NOTE — Progress Notes (Signed)
Subjective:  William Brewer is a 72 y.o. year old very pleasant male patient who presents for/with See problem oriented charting ROS- no edema. No lightheadedness or dizziness. Continued numbness/tingling in feet.   Past Medical History-  Patient Active Problem List   Diagnosis Date Noted  . Diabetes mellitus type II, controlled ([place]) 09/23/2013    Priority: High  . Idiopathic peripheral neuropathy 01/27/2009    Priority: High  . Right groin hernia 10/24/2016    Priority: Medium  . Essential hypertension 03/26/2008    Priority: Medium  . Hyperlipidemia 06/21/2007    Priority: Medium  . Basal cell carcinoma of skin 10/28/2015    Priority: Low  . Fever blister 08/10/2015    Priority: Low  . History of colonic polyps 06/21/2007    Priority: Low    Medications- reviewed and updated Current Outpatient Medications  Medication Sig Dispense Refill  . ALPHA LIPOIC ACID PO Take 1 tablet by mouth daily.      Marland Kitchen aspirin 81 MG tablet Take 81 mg by mouth daily.      Marland Kitchen lisinopril-hydrochlorothiazide (PRINZIDE,ZESTORETIC) 20-12.5 MG tablet TAKE ONE TABLET BY MOUTH ONCE DAILY 90 tablet 4  . lovastatin (MEVACOR) 20 MG tablet Take 1 tablet (20 mg total) by mouth at bedtime. 31 tablet 5  . Lysine HCl 1000 MG TABS Take 1 tablet by mouth daily.      . magnesium 30 MG tablet Take 250 mg by mouth daily.    . Melatonin 3 MG CAPS Take by mouth as needed.    . metFORMIN (GLUCOPHAGE) 500 MG tablet Take 1 tablet (500 mg total) by mouth 2 (two) times daily with a meal. 180 tablet 1  . Multiple Vitamin (MULTIVITAMIN) tablet Take 1 tablet by mouth daily.      . NON FORMULARY Legatrin PM-Take one daily    . SUPER B COMPLEX/C PO Take by mouth daily.    . valACYclovir (VALTREX) 1000 MG tablet TAKE TWO TABLETS BY MOUTH TWICE DAILY FOR 1 DAY AS NEEDED 20 tablet 3   No current facility-administered medications for this visit.     Objective: BP 110/78   Pulse 68   Ht 6' (1.829 m)   Wt 191 lb 3.2 oz (86.7  kg)   SpO2 97%   BMI 25.93 kg/m  Gen: NAD, resting comfortably CV: RRR no murmurs rubs or gallops No chest wall pain Lungs: CTAB no crackles, wheeze, rhonchi Abdomen: soft/nontender/nondistended/normal bowel sounds.  Ext: no edema, good pulses Skin: warm, dry  Assessment/Plan:  Chest pain S:has lost about 25 lbs after he previously had gained at home since last visit. Walking 20-25 minutes a day.   He usually feels somewhat worn down with dieting. He has felt more fatigued than normal.  Has been able to only do about half the matches for pickleball (6 down to 3) due to fatigue. With walking may have the mildest of chest pain and mildest of shortness of breath. No chest pain at rest. He also had left shoulder pain worse with any exercise for about a week- which has now resolved A/P: patient wonders if he could just feel poorly from his weight loss over last 2 months- but with his risk factors of age, DM, HTN, HLD and exertional symptoms of chest pain, left shoulder pain, and shortness of breath- I advised him to take it easy on exercise for now and referred him to cardiology. He does state he gets reflux when losing weight but that is more  of a burning while resting after meals and at bedtime- feels different from current issues  Diabetes mellitus type II, controlled ([place]) S:  controlled. On last visit on metformin 500mg  BID. He has neuropathy that predates his diabetes which is untreated.  Exercise and diet- has lost 25 lbs, walking 20 to 25 minutes each day that he doesn't play pickleball (had gained weight and then lost it between visits) Lab Results  Component Value Date   HGBA1C 6.6 (H) 03/08/2017   HGBA1C 6.0 06/15/2016   HGBA1C 6.3 02/01/2016   A/P: update a1c today with other labs  Hyperlipidemia S: suspect poorly controlled on lovastatin twice a week (up from once a week march 2018). Tolerating medicine ok - no myalgias.  Lab Results  Component Value Date   CHOL 190  06/15/2016   HDL 34.60 (L) 06/15/2016   LDLCALC 118 (H) 06/15/2016   LDLDIRECT 117.0 03/08/2017   TRIG 188.0 (H) 06/15/2016   CHOLHDL 6 06/15/2016   A/P: update lipids- goal for bad cholesterol to be under 100- would ocntinue to titrate up if needed  Essential hypertension S: controlled on lisinopril 20-12.5mg  hctz.  BP Readings from Last 3 Encounters:  02/12/18 110/78  08/30/17 110/65  03/15/17 (!) 102/58  A/P: We discussed blood pressure goal of <140/90. Continue current meds: could consider dose reduction potentially in the future   Also check TSh with his fatigue  Lab/Order associations: Controlled type 2 diabetes mellitus without complication, without long-term current use of insulin (HCC) - Plan: Lipid panel, CBC, Comprehensive metabolic panel, Hemoglobin A1c, Hemoglobin A1c, Comprehensive metabolic panel, CBC, Lipid panel  Hyperlipidemia, unspecified hyperlipidemia type - Plan: Lipid panel, CBC, Comprehensive metabolic panel, Comprehensive metabolic panel, CBC, Lipid panel  Need for prophylactic vaccination and inoculation against influenza - Plan: Flu vaccine HIGH DOSE PF  Other fatigue - Plan: TSH, TSH  Exertional chest pain - Plan: Ambulatory referral to Cardiology  Return precautions advised.  Garret Reddish, MD

## 2018-02-12 NOTE — Patient Instructions (Signed)
Please stop by lab before you go  We will call you within a week or two about your referral to cardiology. If you do not hear within 3 weeks, give [place] a call.   No changes in medicine today  Schedule physical within 4-6 months

## 2018-02-12 NOTE — Assessment & Plan Note (Signed)
S: controlled on lisinopril 20-12.5mg  hctz.  BP Readings from Last 3 Encounters:  02/12/18 110/78  08/30/17 110/65  03/15/17 (!) 102/58  A/P: We discussed blood pressure goal of <140/90. Continue current meds: could consider dose reduction potentially in the future

## 2018-02-12 NOTE — Assessment & Plan Note (Signed)
S: suspect poorly controlled on lovastatin twice a week (up from once a week march 2018). Tolerating medicine ok - no myalgias.  Lab Results  Component Value Date   CHOL 190 06/15/2016   HDL 34.60 (L) 06/15/2016   LDLCALC 118 (H) 06/15/2016   LDLDIRECT 117.0 03/08/2017   TRIG 188.0 (H) 06/15/2016   CHOLHDL 6 06/15/2016   A/P: update lipids- goal for bad cholesterol to be under 100- would ocntinue to titrate up if needed

## 2018-02-12 NOTE — Assessment & Plan Note (Signed)
S:  controlled. On last visit on metformin 500mg  BID. He has neuropathy that predates his diabetes which is untreated.  Exercise and diet- has lost 25 lbs, walking 20 to 25 minutes each day that he doesn't play pickleball (had gained weight and then lost it between visits) Lab Results  Component Value Date   HGBA1C 6.6 (H) 03/08/2017   HGBA1C 6.0 06/15/2016   HGBA1C 6.3 02/01/2016   A/P: update a1c today with other labs

## 2018-02-20 ENCOUNTER — Ambulatory Visit: Payer: Medicare HMO | Admitting: Cardiology

## 2018-02-20 ENCOUNTER — Encounter: Payer: Self-pay | Admitting: Cardiology

## 2018-02-20 VITALS — BP 100/68 | HR 80 | Ht 72.0 in | Wt 189.0 lb

## 2018-02-20 DIAGNOSIS — I10 Essential (primary) hypertension: Secondary | ICD-10-CM | POA: Diagnosis not present

## 2018-02-20 DIAGNOSIS — E785 Hyperlipidemia, unspecified: Secondary | ICD-10-CM | POA: Diagnosis not present

## 2018-02-20 DIAGNOSIS — R079 Chest pain, unspecified: Secondary | ICD-10-CM | POA: Diagnosis not present

## 2018-02-20 MED ORDER — METOPROLOL TARTRATE 50 MG PO TABS: 50.0000 mg | ORAL_TABLET | Freq: Once | ORAL | 0 refills | Status: DC

## 2018-02-20 NOTE — Progress Notes (Signed)
Cardiology Office Note:    Date:  02/20/2018   ID:  Denton, Derks 11-15-1946, MRN [id number]  PCP:  Marin Olp, MD  Cardiologist:  No primary care provider on file.   Referring MD: Marin Olp, MD   Chief complain: Chest pain  History of Present Illness:    William Brewer is a 72 y.o. male with a hx of hypertension and hyperlipidemia, type 2 diabetes and family history of coronary artery disease in his father, with quadruple bypass at age of 45. The patient is very active he walks daily plays pickle ball a couple times a week, he has not is likely that he gets tired quicker and has developed pressure in his chest with radiation to his neck and to his left arm. He denies any palpitations dizziness or syncope. He has been dieting since earlier this year by reducing portions of food and has lost 25 pounds so for however doesn't alter type of food he eats and agrees that his foot is not optimal and he likes a lot of sweets. The patient gets his symptoms with exertion and has experienced it several times in the last few weeks, they resolve at rest.  Past Medical History:  Diagnosis Date  . Allergy   . Basal cell carcinoma    leg  . Colon polyps   . Diabetes mellitus without complication ([place])   . DIVERTICULOSIS, COLON 05/26/2008  . Hyperlipidemia    refused treatment  . Hypertension   . Peripheral neuropathy     Past Surgical History:  Procedure Laterality Date  . APPENDECTOMY    . COLONOSCOPY    . POLYPECTOMY      Current Medications: Current Meds  Medication Sig  . aspirin 81 MG tablet Take 81 mg by mouth daily.    Marland Kitchen lisinopril-hydrochlorothiazide (PRINZIDE,ZESTORETIC) 20-12.5 MG tablet TAKE ONE TABLET BY MOUTH ONCE DAILY  . lovastatin (MEVACOR) 20 MG tablet Take 1 tablet (20 mg total) by mouth at bedtime.  Marland Kitchen Lysine HCl 1000 MG TABS Take 1 tablet by mouth daily.    . magnesium 30 MG tablet Take 250 mg by mouth daily.  . Melatonin 3 MG CAPS Take by mouth  as needed.  . metFORMIN (GLUCOPHAGE) 500 MG tablet Take 1 tablet (500 mg total) by mouth 2 (two) times daily with a meal.  . Multiple Vitamin (MULTIVITAMIN) tablet Take 1 tablet by mouth daily.    . Multiple Vitamins-Minerals (AIRBORNE GUMMIES PO) Take by mouth daily.   . NON FORMULARY Legatrin PM-Take one daily  . SUPER B COMPLEX/C PO Take by mouth daily.  . valACYclovir (VALTREX) 1000 MG tablet TAKE TWO TABLETS BY MOUTH TWICE DAILY FOR 1 DAY AS NEEDED  . [DISCONTINUED] ALPHA LIPOIC ACID PO Take 1 tablet by mouth daily.       Allergies:   Patient has no known allergies.   Social History   Socioeconomic History  . Marital status: Married    Spouse name: None  . Number of children: None  . Years of education: None  . Highest education level: None  Social Needs  . Financial resource strain: None  . Food insecurity - worry: None  . Food insecurity - inability: None  . Transportation needs - medical: None  . Transportation needs - non-medical: None  Occupational History  . None  Tobacco Use  . Smoking status: Never Smoker  . Smokeless tobacco: Never Used  Substance and Sexual Activity  . Alcohol use: Yes  Alcohol/week: 3.0 - 3.6 oz    Types: 3 Cans of beer, 2 - 3 Standard drinks or equivalent per week    Comment: 3 a week   . Drug use: No  . Sexual activity: None  Other Topics Concern  . None  Social History Narrative   Married 1973. 2 children. 2 grandkids. Son in [place]. Daughter 5 minutes away.       Retired Worked form Administrator, arts: support group for peripheral neuropathy, chess, words with friends, active with [employer] college (board of advisors)     Family History: The patient's family history includes Cancer in his mother; Deep vein thrombosis in his mother; Heart disease (age of onset: 64) in his father. There is no history of Hypertension or Colon cancer.  ROS:   Please see the history of present illness.     All other systems reviewed and are negative.  EKGs/Labs/Other Studies Reviewed:    The following studies were reviewed today:  EKG:  EKG is ordered today.  The ekg ordered today demonstrates normal sinus rhythm, normal EKG, this was personally reviewed.  Recent Labs: 02/12/2018: ALT 31; BUN 25; Creatinine, Ser 1.12; Hemoglobin 14.6; Platelets 189.0; Potassium 4.0; Sodium 138; TSH 3.89  Recent Lipid Panel    Component Value Date/Time   CHOL 150 02/12/2018 0845   TRIG 237.0 (H) 02/12/2018 0845   HDL 28.00 (L) 02/12/2018 0845   CHOLHDL 5 02/12/2018 0845   VLDL 47.4 (H) 02/12/2018 0845   LDLCALC 118 (H) 06/15/2016 0951   LDLDIRECT 91.0 02/12/2018 0845    Physical Exam:    VS:  BP 100/68 (BP Location: Left Arm, Patient Position: Sitting, Cuff Size: Normal)   Pulse 80   Ht 6' (1.829 m)   Wt 189 lb (85.7 kg)   SpO2 96%   BMI 25.63 kg/m     Wt Readings from Last 3 Encounters:  02/20/18 189 lb (85.7 kg)  02/12/18 191 lb 3.2 oz (86.7 kg)  03/15/17 189 lb 6.4 oz (85.9 kg)     GEN:  Well nourished, well developed in no acute distress HEENT: Normal NECK: No JVD; No carotid bruits LYMPHATICS: No lymphadenopathy CARDIAC: RRR, no murmurs, rubs, gallops RESPIRATORY:  Clear to auscultation without rales, wheezing or rhonchi  ABDOMEN: Soft, non-tender, non-distended MUSCULOSKELETAL:  No edema; No deformity  SKIN: Warm and dry NEUROLOGIC:  Alert and oriented x 3 PSYCHIATRIC:  Normal affect   ASSESSMENT:    1. Essential hypertension   2. Hyperlipidemia, unspecified hyperlipidemia type   3. Exertional chest pain    PLAN:    In order of problems listed above:  1. Chest pain - exertional typical concerning for obstructive coronary artery disease, he has poorly controlled hyperlipidemia, and other risk factors such as hypertension and diabetes, will obtain calcium score and coronary CTA. 2. Hypertension - well controlled. 3. Hyperlipidemia - elevated triglycerides and LDL,  considering he is a diabetic, based on results of his CT we will change his statin.   Medication Adjustments/Labs and Tests Ordered: Current medicines are reviewed at length with the patient today.  Concerns regarding medicines are outlined above.  Orders Placed This Encounter  Procedures  . CT CORONARY MORPH W/CTA COR W/SCORE W/CA W/CM &/OR WO/CM  . CT CORONARY FRACTIONAL FLOW RESERVE DATA PREP  . CT CORONARY FRACTIONAL FLOW RESERVE FLUID ANALYSIS  . EKG 12-Lead   Meds ordered this encounter  Medications  . metoprolol tartrate (LOPRESSOR) 50 MG  tablet    Sig: Take 1 tablet (50 mg total) by mouth once for 1 dose. Take 1 hour prior to your coronary CT.    Dispense:  1 tablet    Refill:  0    Signed, Ena Dawley, MD  02/20/2018 11:21 AM    [place]

## 2018-02-20 NOTE — Patient Instructions (Signed)
Medication Instructions:   Your physician recommends that you continue on your current medications as directed. Please refer to the Current Medication list given to you today.     Testing/Procedures:   CORONARY CT Please arrive at the [facility] main entrance of [facility] at xx:xx AM (30-45 minutes prior to test start time)  [facility] [address] [place], [place] [address] [phone]  Proceed to the [facility] Radiology Department (First Floor).  Please follow these instructions carefully (unless otherwise directed):  Hold all erectile dysfunction medications at least 48 hours prior to test.  On the Night Before the Test: . Drink plenty of water. . Do not consume any caffeinated/decaffeinated beverages or chocolate 12 hours prior to your test. . Do not take any antihistamines 12 hours prior to your test. . If you take Metformin do not take 24 hours prior to test.  On the Day of the Test: . Drink plenty of water. Do not drink any water within one hour of the test. . Do not eat any food 4 hours prior to the test. . You may take your regular medications prior to the test. . IF NOT ON A BETA BLOCKER - Take 50 mg of lopressor (metoprolol) one hour before the test.   After the Test: . Drink plenty of water. . After receiving IV contrast, you may experience a mild flushed feeling. This is normal. . On occasion, you may experience a mild rash up to 24 hours after the test. This is not dangerous. If this occurs, you can take Benadryl 25 mg and increase your fluid intake. . If you experience trouble breathing, this can be serious. If it is severe call 911 IMMEDIATELY. If it is mild, please call our office. . If you take any of these medications: Glipizide/Metformin, Avandament, Glucavance, please do not take 48 hours after completing test.    Follow-Up:  3 MONTHS WITH AN EXTENDER ON DR NELSON'S TEAM       If you need a refill on your  cardiac medications before your next appointment, please call your pharmacy.

## 2018-02-22 ENCOUNTER — Encounter: Payer: Self-pay | Admitting: Family Medicine

## 2018-03-11 ENCOUNTER — Telehealth: Payer: Self-pay | Admitting: Cardiology

## 2018-03-11 DIAGNOSIS — E785 Hyperlipidemia, unspecified: Secondary | ICD-10-CM

## 2018-03-11 DIAGNOSIS — I10 Essential (primary) hypertension: Secondary | ICD-10-CM

## 2018-03-11 DIAGNOSIS — R072 Precordial pain: Secondary | ICD-10-CM

## 2018-03-11 NOTE — Telephone Encounter (Signed)
Pt is calling to inform Dr Meda Coffee that [place] did not approve for him to have his coronary CT done.  Pt would like to know what else Dr Meda Coffee would like to do in place of the coronary CT. Informed the pt that I will send this message to Dr Meda Coffee to review and advise on, and follow-up with the pt shortly thereafter.  Pt verbalized understanding and agrees with this plan.

## 2018-03-11 NOTE — Telephone Encounter (Signed)
New Message:   Pt is calling due to insurance canceling recent test ordered and pt wants to know what should be done next.

## 2018-03-12 ENCOUNTER — Encounter: Payer: Self-pay | Admitting: *Deleted

## 2018-03-12 DIAGNOSIS — R079 Chest pain, unspecified: Secondary | ICD-10-CM | POA: Insufficient documentation

## 2018-03-12 NOTE — Telephone Encounter (Signed)
Pts exercise myoview is scheduled for 03/20/18 at 0730. Pt made aware of appt date and time by [facility] scheduling.

## 2018-03-12 NOTE — Telephone Encounter (Signed)
Spoke with the pt and informed him that per Dr Meda Coffee, we will order for him to have an exercise nuclear stress test done. Informed the pt that  I will place the order in the system and send our [facility] schedulers a message to call him back to have this appt arranged.  Sent the pts exercise myoview instructions to him via mychart, and he confirmed that he did receive this. Pt read through these instructions and understood them clearly.  Pt verbalized understanding and agrees with this plan.

## 2018-03-12 NOTE — Telephone Encounter (Signed)
Lets do an exercise nuclear stress test

## 2018-03-19 ENCOUNTER — Telehealth: Payer: Self-pay | Admitting: Cardiology

## 2018-03-19 NOTE — Telephone Encounter (Signed)
Spoke with the pt and the spouse.  Both just wanting clarification on what the myoview vs denied Coronary CT will look at.  Pt education provided.  Pt was interested in appeal for the coronary ct but highly advised him to proceed with order myoview for Wednesday, for there could be a more potential in delay of care and his ongoing chest pain.  Pt states he will proceed with getting his myoview done.

## 2018-03-19 NOTE — Telephone Encounter (Signed)
Pt would like to discuss the denial of a CT [id number] by [employer] and pt would like to appeal it.  Pt needs codes.   Pt did not want to speak to Billing I offered.

## 2018-03-20 ENCOUNTER — Telehealth: Payer: Self-pay | Admitting: Cardiology

## 2018-03-20 ENCOUNTER — Ambulatory Visit (HOSPITAL_COMMUNITY): Payer: Medicare HMO | Attending: Cardiology

## 2018-03-20 DIAGNOSIS — I10 Essential (primary) hypertension: Secondary | ICD-10-CM

## 2018-03-20 DIAGNOSIS — R072 Precordial pain: Secondary | ICD-10-CM | POA: Diagnosis not present

## 2018-03-20 DIAGNOSIS — E785 Hyperlipidemia, unspecified: Secondary | ICD-10-CM | POA: Diagnosis not present

## 2018-03-20 LAB — MYOCARDIAL PERFUSION IMAGING
Estimated workload: 10.6 METS
Exercise duration (min): 10 min
Exercise duration (sec): 0 s
LV dias vol: 88 mL (ref 62–150)
LV sys vol: 34 mL
MPHR: 149 {beats}/min
Peak HR: 129 {beats}/min
Percent HR: 86 %
RATE: 0.29
Rest HR: 72 {beats}/min
SDS: 4
SRS: 2
SSS: 6
TID: 0.71

## 2018-03-20 MED ORDER — TECHNETIUM TC 99M TETROFOSMIN IV KIT
31.3000 | PACK | Freq: Once | INTRAVENOUS | Status: AC | PRN
  Administered 2018-03-20: 31.3 via INTRAVENOUS
  Filled 2018-03-20: qty 32

## 2018-03-20 MED ORDER — TECHNETIUM TC 99M TETROFOSMIN IV KIT
10.7000 | PACK | Freq: Once | INTRAVENOUS | Status: AC | PRN
  Administered 2018-03-20: 10.7 via INTRAVENOUS
  Filled 2018-03-20: qty 11

## 2018-03-20 NOTE — Telephone Encounter (Signed)
Result Notes for Myocardial Perfusion Imaging   Notes recorded by Nuala Alpha, LPN on 07/11/5500 at 5:86 PM EDT Spoke with the pt and informed him of abnormal exercise myoview results and recommendations, per Dr Meda Coffee, for him to have a cardiac cath done. Pt was originally ordered to have a Coronary CT done back in 01/2018 and this was denied to [place], and switched to an exercise myoview. Pt last saw Dr Meda Coffee in the clinic on 02/20/18. Pt will need an updated H & P for his cath needed.  Informed the pt that we need to bring him into the clinic tomorrow, and arrange for him to get a left cardiac cath done.  Informed the pt that at [place], it will get scheduled, labs will be drawn, and he will get to speak to the PA-C in depth about abnormal study.  Scheduled the pt to see Vin Bhagat PA-C for tomorrow 3/28 at 2 pm. Advised the pt to arrive 15 mins prior to this appt.  Pt verbalized understanding and agrees with this plan. ------  Notes recorded by Dorothy Spark, MD on 03/20/2018 at 3:50 PM EDT Abnormal stress test, please schedule a cath

## 2018-03-20 NOTE — Telephone Encounter (Signed)
New Message  Pt calling with questions about my chart message her received,states he was expecting a callback regarding test so he can discuss his results. Please call

## 2018-03-21 ENCOUNTER — Ambulatory Visit: Payer: Medicare HMO | Admitting: Physician Assistant

## 2018-03-21 VITALS — BP 160/82 | HR 84 | Ht 72.0 in | Wt 190.0 lb

## 2018-03-21 DIAGNOSIS — R9439 Abnormal result of other cardiovascular function study: Secondary | ICD-10-CM

## 2018-03-21 DIAGNOSIS — Z01812 Encounter for preprocedural laboratory examination: Secondary | ICD-10-CM | POA: Diagnosis not present

## 2018-03-21 DIAGNOSIS — E109 Type 1 diabetes mellitus without complications: Secondary | ICD-10-CM

## 2018-03-21 NOTE — Patient Instructions (Signed)
Your physician recommends that you continue on your current medications as directed. Please refer to the Current Medication list given to you today.  Your physician recommends that you return for lab work in:  [facility] OFF[facility] [address], [place] [address] Dept: [phone] Loc: [place]  03/21/2018  You are scheduled for a Cardiac Catheterization on Tuesday, April 2 with Dr. Larae Grooms.  1. Please arrive at the [facility] (Main Entrance A) at [facility]: [address] [place], [place] [address] at 5:30 AM (two hours before your procedure to ensure your preparation). Free valet parking service is available.   Special note: Every effort is made to have your procedure done on time. Please understand that emergencies sometimes delay scheduled procedures.  2. Diet: Do not eat or drink anything after midnight prior to your procedure except sips of water to take medications.  3. Labs: You will need to have blood drawn on Thursday, March 28 at [facility] at [facility]. [address]  Open: 7:30am - 5pm    Phone: [phone]. You do not need to be fasting.  4. Medication instructions in preparation for your procedure:   Current Outpatient Medications (Endocrine & Metabolic):  .  metFORMIN (GLUCOPHAGE) 500 MG tablet, Take 1 tablet (500 mg total) by mouth 2 (two) times daily with a meal.  Current Outpatient Medications (Cardiovascular):  .  lisinopril-hydrochlorothiazide (PRINZIDE,ZESTORETIC) 20-12.5 MG tablet, TAKE ONE TABLET BY MOUTH ONCE DAILY .  lovastatin (MEVACOR) 20 MG tablet, Take 1 tablet (20 mg total) by mouth at bedtime.   Current Outpatient Medications (Analgesics):  .  aspirin 81 MG tablet, Take 81 mg by mouth daily.     Current Outpatient Medications (Other):  Marland Kitchen  Lysine HCl  1000 MG TABS, Take 1 tablet by mouth daily.   .  magnesium 30 MG tablet, Take 250 mg by mouth daily. .  Melatonin 3 MG CAPS, Take by mouth as needed. .  Multiple Vitamin (MULTIVITAMIN) tablet, Take 1 tablet by mouth daily.   .  Multiple Vitamins-Minerals (AIRBORNE GUMMIES PO), Take by mouth daily.  .  NON FORMULARY, Legatrin PM-Take one daily .  SUPER B COMPLEX/C PO, Take by mouth daily. .  valACYclovir (VALTREX) 1000 MG tablet, TAKE TWO TABLETS BY MOUTH TWICE DAILY FOR 1 DAY AS NEEDED *For reference purposes while preparing patient instructions.   Delete this med list prior to printing instructions for patient.*    Stop taking, Glucophage (Metformin) on Monday, April 1.    24 HOURS BEFORE AND 48 HOURS AFTER   On the morning of your procedure, take your Aspirin and any morning medicines NOT listed above.  You may use sips of water.  5. Plan for one night stay--bring personal belongings. 6. Bring a current list of your medications and current insurance cards. 7. You MUST have a responsible person to drive you home. 8. Someone MUST be with you the first 24 hours after you arrive home or your discharge will be delayed. 9. Please wear clothes that are easy to get on and off and wear slip-on shoes.  Thank you for allowing [place] to care for you!   --  Invasive Cardiovascular services

## 2018-03-21 NOTE — Progress Notes (Signed)
Cardiology Office Note    Date:  03/21/2018   ID:  Lisle, Skillman February 11, 1946, MRN [id number]  PCP:  Marin Olp, MD  Cardiologist:  Dr. Meda Coffee   Chief Complaint: Abnormal exercise Myoview  History of Present Illness:   William Brewer is a 72 y.o. male with a hx of hypertension and hyperlipidemia, type 2 diabetes and family history of coronary artery disease in his father, with quadruple bypass at age of 20 presents for follow up.   Seen by Dr. Meda Coffee 02/20/18 for chest pain concerning for obstructive coronary artery disease. Order coronary CT however declined by insurance. Subsequently he had exercise myoview which resulted as high risk. There is a medium defect of moderate severity present in the basal inferior, basal inferolateral, mid inferior and mid inferolateral location. The defect is partially reversible and consistent with infarct and peri infarct ischemia.  Dr. Meda Coffee recommended cath and here today for discussion. Still has intermittent chest heaviness with exertion. No dyspnea, orthopnea, PND, syncope, LE edema or palpitations. Patient and wife had many questions regarding stress test and cath, all answered.   Past Medical History:  Diagnosis Date  . Allergy   . Basal cell carcinoma    leg  . Colon polyps   . Diabetes mellitus without complication ([place])   . DIVERTICULOSIS, COLON 05/26/2008  . Hyperlipidemia    refused treatment  . Hypertension   . Peripheral neuropathy     Past Surgical History:  Procedure Laterality Date  . APPENDECTOMY    . COLONOSCOPY    . POLYPECTOMY      Current Medications: Prior to Admission medications   Medication Sig Start Date End Date Taking? Authorizing Provider  aspirin 81 MG tablet Take 81 mg by mouth daily.      [provider]  lisinopril-hydrochlorothiazide (PRINZIDE,ZESTORETIC) 20-12.5 MG tablet TAKE ONE TABLET BY MOUTH ONCE DAILY 07/30/17   Marin Olp, MD  lovastatin (MEVACOR) 20 MG tablet Take 1  tablet (20 mg total) by mouth at bedtime. 10/24/16   Marin Olp, MD  Lysine HCl 1000 MG TABS Take 1 tablet by mouth daily.      [provider]  magnesium 30 MG tablet Take 250 mg by mouth daily.    [provider]  Melatonin 3 MG CAPS Take by mouth as needed.    [provider]  metFORMIN (GLUCOPHAGE) 500 MG tablet Take 1 tablet (500 mg total) by mouth 2 (two) times daily with a meal. 09/10/17   Marin Olp, MD  metoprolol tartrate (LOPRESSOR) 50 MG tablet Take 1 tablet (50 mg total) by mouth once for 1 dose. Take 1 hour prior to your coronary CT. 02/20/18 02/20/18  Dorothy Spark, MD  Multiple Vitamin (MULTIVITAMIN) tablet Take 1 tablet by mouth daily.      [provider]  Multiple Vitamins-Minerals (AIRBORNE GUMMIES PO) Take by mouth daily.     [provider]  NON FORMULARY Legatrin PM-Take one daily    [provider]  SUPER B COMPLEX/C PO Take by mouth daily.    [provider]  valACYclovir (VALTREX) 1000 MG tablet TAKE TWO TABLETS BY MOUTH TWICE DAILY FOR 1 DAY AS NEEDED 01/11/18   Marin Olp, MD    Allergies:   Patient has no known allergies.   Social History   Socioeconomic History  . Marital status: Married    Spouse name: Not on file  . Number of children: Not on file  .  Years of education: Not on file  . Highest education level: Not on file  Occupational History  . Not on file  Social Needs  . Financial resource strain: Not on file  . Food insecurity:    Worry: Not on file    Inability: Not on file  . Transportation needs:    Medical: Not on file    Non-medical: Not on file  Tobacco Use  . Smoking status: Never Smoker  . Smokeless tobacco: Never Used  Substance and Sexual Activity  . Alcohol use: Yes    Alcohol/week: 3.0 - 3.6 oz    Types: 3 Cans of beer, 2 - 3 Standard drinks or equivalent per week    Comment: 3 a week   . Drug use: No  . Sexual activity: Not on file  Lifestyle   . Physical activity:    Days per week: Not on file    Minutes per session: Not on file  . Stress: Not on file  Relationships  . Social connections:    Talks on phone: Not on file    Gets together: Not on file    Attends religious service: Not on file    Active member of club or organization: Not on file    Attends meetings of clubs or organizations: Not on file    Relationship status: Not on file  Other Topics Concern  . Not on file  Social History Narrative   Married 1973. 2 children. 2 grandkids. Son in [place]. Daughter 5 minutes away.       Retired Worked form Administrator, arts: support group for peripheral neuropathy, chess, words with friends, active with [employer] college (board of advisors)     Family History:  The patient's family history includes Cancer in his mother; Deep vein thrombosis in his mother; Heart disease (age of onset: 66) in his father.  ROS:   Please see the history of present illness.    ROS All other systems reviewed and are negative.   PHYSICAL EXAM:   VS:  BP (!) 160/82   Pulse 84   Ht 6' (1.829 m)   Wt 190 lb (86.2 kg)   BMI 25.77 kg/m    GEN: Well nourished, well developed, in no acute distress  HEENT: normal  Neck: no JVD, carotid bruits, or masses Cardiac: RRR; no murmurs, rubs, or gallops,no edema  Respiratory:  clear to auscultation bilaterally, normal work of breathing GI: soft, nontender, nondistended, + BS MS: no deformity or atrophy  Skin: warm and dry, no rash Neuro:  Alert and Oriented x 3, Strength and sensation are intact Psych: euthymic mood, full affect  Wt Readings from Last 3 Encounters:  03/21/18 190 lb (86.2 kg)  03/20/18 189 lb (85.7 kg)  02/20/18 189 lb (85.7 kg)      Studies/Labs Reviewed:   EKG:  EKG is not ordered today.    Recent Labs: 02/12/2018: ALT 31; BUN 25; Creatinine, Ser 1.12; Hemoglobin 14.6; Platelets 189.0; Potassium 4.0; Sodium 138; TSH 3.89    Lipid Panel    Component Value Date/Time   CHOL 150 02/12/2018 0845   TRIG 237.0 (H) 02/12/2018 0845   HDL 28.00 (L) 02/12/2018 0845   CHOLHDL 5 02/12/2018 0845   VLDL 47.4 (H) 02/12/2018 0845   LDLCALC 118 (H) 06/15/2016 0951   LDLDIRECT 91.0 02/12/2018 0845    Additional studies/ records that were reviewed today include:   Exercise Myoivew 03/20/17  Nuclear stress  EF: 61%.  Blood pressure demonstrated a normal response to exercise.  There was no ST segment deviation noted during stress.  There is a medium defect of moderate severity present in the basal inferior, basal inferolateral, mid inferior and mid inferolateral location. The defect is partially reversible and consistent with infarct and peri infarct ischemia.  This is a high risk study.  The left ventricular ejection fraction is normal (55-65%) with inferior hypokinesis  Recommend cardiac catheterization.    ASSESSMENT & PLAN:    1. Chest pain/abnormal stress test - No extraneous activity. PRN  Nitro. He will got ER if worsening of symptoms. Continue ASA and statin.  The patient understands that risks include but are not limited to stroke (1 in 1000), death (1 in 101), kidney failure [usually temporary] (1 in 500), bleeding (1 in 200), allergic reaction [possibly serious] (1 in 200), and agrees to proceed.   2. HLD - 02/12/2018: Cholesterol 150; HDL 28.00; Triglycerides 237.0; VLDL 47.4 Non HDL 121, LDL 91 - Consider changing lovastatin to another agent pending cath.   3. DM  - A1c 7.3. Per PCP.   Medication Adjustments/Labs and Tests Ordered: Current medicines are reviewed at length with the patient today.  Concerns regarding medicines are outlined above.  Medication changes, Labs and Tests ordered today are listed in the Patient Instructions below. Patient Instructions  Your physician recommends that you continue on your current medications as directed. Please refer to the Current Medication list given  to you today.  Your physician recommends that you return for lab work in:  [facility] [address], [place] [address] Dept: [phone] Loc: [place]  03/21/2018  You are scheduled for a Cardiac Catheterization on Tuesday, April 2 with Dr. Larae Grooms.  1. Please arrive at the [facility] (Main Entrance A) at [facility]: [address] [place], [place] [address] at 5:30 AM (two hours before your procedure to ensure your preparation). Free valet parking service is available.   Special note: Every effort is made to have your procedure done on time. Please understand that emergencies sometimes delay scheduled procedures.  2. Diet: Do not eat or drink anything after midnight prior to your procedure except sips of water to take medications.  3. Labs: You will need to have blood drawn on Thursday, March 28 at [facility] at [facility]. [address]  Open: 7:30am - 5pm    Phone: [phone]. You do not need to be fasting.  4. Medication instructions in preparation for your procedure:   Current Outpatient Medications (Endocrine & Metabolic):  .  metFORMIN (GLUCOPHAGE) 500 MG tablet, Take 1 tablet (500 mg total) by mouth 2 (two) times daily with a meal.  Current Outpatient Medications (Cardiovascular):  .  lisinopril-hydrochlorothiazide (PRINZIDE,ZESTORETIC) 20-12.5 MG tablet, TAKE ONE TABLET BY MOUTH ONCE DAILY .  lovastatin (MEVACOR) 20 MG tablet, Take 1 tablet (20 mg total) by mouth at bedtime.   Current Outpatient Medications (Analgesics):  .  aspirin 81 MG tablet, Take 81 mg by mouth daily.     Current Outpatient Medications (Other):  Marland Kitchen  Lysine HCl 1000 MG TABS, Take 1 tablet by mouth daily.   .  magnesium 30 MG tablet, Take 250 mg by mouth daily. .  Melatonin 3 MG CAPS, Take by  mouth as needed. Marland Kitchen  Multiple Vitamin (MULTIVITAMIN) tablet, Take 1 tablet by mouth daily.   .  Multiple Vitamins-Minerals (AIRBORNE GUMMIES PO), Take by mouth daily.  .  NON FORMULARY, Legatrin PM-Take one daily .  SUPER B COMPLEX/C PO, Take by mouth daily. .  valACYclovir (VALTREX) 1000 MG tablet, TAKE TWO TABLETS BY MOUTH TWICE DAILY FOR 1 DAY AS NEEDED *For reference purposes while preparing patient instructions.   Delete this med list prior to printing instructions for patient.*    Stop taking, Glucophage (Metformin) on Monday, April 1.    24 HOURS BEFORE AND 48 HOURS AFTER   On the morning of your procedure, take your Aspirin and any morning medicines NOT listed above.  You may use sips of water.  5. Plan for one night stay--bring personal belongings. 6. Bring a current list of your medications and current insurance cards. 7. You MUST have a responsible person to drive you home. 8. Someone MUST be with you the first 24 hours after you arrive home or your discharge will be delayed. 9. Please wear clothes that are easy to get on and off and wear slip-on shoes.  Thank you for allowing [place] to care for you!   -- [facility] Invasive Cardiovascular services    Signed, Leanor Kail, [place]  03/21/2018 3:30 PM    [place], [place], [place]  [address] Phone: [phone]; Fax: [phone]

## 2018-03-21 NOTE — H&P (View-Only) (Signed)
Cardiology Office Note    Date:  03/21/2018   ID:  Wheeler, Incorvaia 02-Mar-1946, MRN [id number]  PCP:  Marin Olp, MD  Cardiologist:  Dr. Meda Coffee   Chief Complaint: Abnormal exercise Myoview  History of Present Illness:   William Brewer is a 72 y.o. male with a hx of hypertension and hyperlipidemia, type 2 diabetes and family history of coronary artery disease in his father, with quadruple bypass at age of 29 presents for follow up.   Seen by Dr. Meda Coffee 02/20/18 for chest pain concerning for obstructive coronary artery disease. Order coronary CT however declined by insurance. Subsequently he had exercise myoview which resulted as high risk. There is a medium defect of moderate severity present in the basal inferior, basal inferolateral, mid inferior and mid inferolateral location. The defect is partially reversible and consistent with infarct and peri infarct ischemia.  Dr. Meda Coffee recommended cath and here today for discussion. Still has intermittent chest heaviness with exertion. No dyspnea, orthopnea, PND, syncope, LE edema or palpitations. Patient and wife had many questions regarding stress test and cath, all answered.   Past Medical History:  Diagnosis Date  . Allergy   . Basal cell carcinoma    leg  . Colon polyps   . Diabetes mellitus without complication ([place])   . DIVERTICULOSIS, COLON 05/26/2008  . Hyperlipidemia    refused treatment  . Hypertension   . Peripheral neuropathy     Past Surgical History:  Procedure Laterality Date  . APPENDECTOMY    . COLONOSCOPY    . POLYPECTOMY      Current Medications: Prior to Admission medications   Medication Sig Start Date End Date Taking? Authorizing Provider  aspirin 81 MG tablet Take 81 mg by mouth daily.      [provider]  lisinopril-hydrochlorothiazide (PRINZIDE,ZESTORETIC) 20-12.5 MG tablet TAKE ONE TABLET BY MOUTH ONCE DAILY 07/30/17   Marin Olp, MD  lovastatin (MEVACOR) 20 MG tablet Take 1  tablet (20 mg total) by mouth at bedtime. 10/24/16   Marin Olp, MD  Lysine HCl 1000 MG TABS Take 1 tablet by mouth daily.      [provider]  magnesium 30 MG tablet Take 250 mg by mouth daily.    [provider]  Melatonin 3 MG CAPS Take by mouth as needed.    [provider]  metFORMIN (GLUCOPHAGE) 500 MG tablet Take 1 tablet (500 mg total) by mouth 2 (two) times daily with a meal. 09/10/17   Marin Olp, MD  metoprolol tartrate (LOPRESSOR) 50 MG tablet Take 1 tablet (50 mg total) by mouth once for 1 dose. Take 1 hour prior to your coronary CT. 02/20/18 02/20/18  Dorothy Spark, MD  Multiple Vitamin (MULTIVITAMIN) tablet Take 1 tablet by mouth daily.      [provider]  Multiple Vitamins-Minerals (AIRBORNE GUMMIES PO) Take by mouth daily.     [provider]  NON FORMULARY Legatrin PM-Take one daily    [provider]  SUPER B COMPLEX/C PO Take by mouth daily.    [provider]  valACYclovir (VALTREX) 1000 MG tablet TAKE TWO TABLETS BY MOUTH TWICE DAILY FOR 1 DAY AS NEEDED 01/11/18   Marin Olp, MD    Allergies:   Patient has no known allergies.   Social History   Socioeconomic History  . Marital status: Married    Spouse name: Not on file  . Number of children: Not on file  .  Years of education: Not on file  . Highest education level: Not on file  Occupational History  . Not on file  Social Needs  . Financial resource strain: Not on file  . Food insecurity:    Worry: Not on file    Inability: Not on file  . Transportation needs:    Medical: Not on file    Non-medical: Not on file  Tobacco Use  . Smoking status: Never Smoker  . Smokeless tobacco: Never Used  Substance and Sexual Activity  . Alcohol use: Yes    Alcohol/week: 3.0 - 3.6 oz    Types: 3 Cans of beer, 2 - 3 Standard drinks or equivalent per week    Comment: 3 a week   . Drug use: No  . Sexual activity: Not on file  Lifestyle   . Physical activity:    Days per week: Not on file    Minutes per session: Not on file  . Stress: Not on file  Relationships  . Social connections:    Talks on phone: Not on file    Gets together: Not on file    Attends religious service: Not on file    Active member of club or organization: Not on file    Attends meetings of clubs or organizations: Not on file    Relationship status: Not on file  Other Topics Concern  . Not on file  Social History Narrative   Married 1973. 2 children. 2 grandkids. Son in [place]. Daughter 5 minutes away.       Retired Worked form Administrator, arts: support group for peripheral neuropathy, chess, words with friends, active with [employer] college (board of advisors)     Family History:  The patient's family history includes Cancer in his mother; Deep vein thrombosis in his mother; Heart disease (age of onset: 50) in his father.  ROS:   Please see the history of present illness.    ROS All other systems reviewed and are negative.   PHYSICAL EXAM:   VS:  BP (!) 160/82   Pulse 84   Ht 6' (1.829 m)   Wt 190 lb (86.2 kg)   BMI 25.77 kg/m    GEN: Well nourished, well developed, in no acute distress  HEENT: normal  Neck: no JVD, carotid bruits, or masses Cardiac: RRR; no murmurs, rubs, or gallops,no edema  Respiratory:  clear to auscultation bilaterally, normal work of breathing GI: soft, nontender, nondistended, + BS MS: no deformity or atrophy  Skin: warm and dry, no rash Neuro:  Alert and Oriented x 3, Strength and sensation are intact Psych: euthymic mood, full affect  Wt Readings from Last 3 Encounters:  03/21/18 190 lb (86.2 kg)  03/20/18 189 lb (85.7 kg)  02/20/18 189 lb (85.7 kg)      Studies/Labs Reviewed:   EKG:  EKG is not ordered today.    Recent Labs: 02/12/2018: ALT 31; BUN 25; Creatinine, Ser 1.12; Hemoglobin 14.6; Platelets 189.0; Potassium 4.0; Sodium 138; TSH 3.89    Lipid Panel    Component Value Date/Time   CHOL 150 02/12/2018 0845   TRIG 237.0 (H) 02/12/2018 0845   HDL 28.00 (L) 02/12/2018 0845   CHOLHDL 5 02/12/2018 0845   VLDL 47.4 (H) 02/12/2018 0845   LDLCALC 118 (H) 06/15/2016 0951   LDLDIRECT 91.0 02/12/2018 0845    Additional studies/ records that were reviewed today include:   Exercise Myoivew 03/20/17  Nuclear stress  EF: 61%.  Blood pressure demonstrated a normal response to exercise.  There was no ST segment deviation noted during stress.  There is a medium defect of moderate severity present in the basal inferior, basal inferolateral, mid inferior and mid inferolateral location. The defect is partially reversible and consistent with infarct and peri infarct ischemia.  This is a high risk study.  The left ventricular ejection fraction is normal (55-65%) with inferior hypokinesis  Recommend cardiac catheterization.    ASSESSMENT & PLAN:    1. Chest pain/abnormal stress test - No extraneous activity. PRN  Nitro. He will got ER if worsening of symptoms. Continue ASA and statin.  The patient understands that risks include but are not limited to stroke (1 in 1000), death (1 in 52), kidney failure [usually temporary] (1 in 500), bleeding (1 in 200), allergic reaction [possibly serious] (1 in 200), and agrees to proceed.   2. HLD - 02/12/2018: Cholesterol 150; HDL 28.00; Triglycerides 237.0; VLDL 47.4 Non HDL 121, LDL 91 - Consider changing lovastatin to another agent pending cath.   3. DM  - A1c 7.3. Per PCP.   Medication Adjustments/Labs and Tests Ordered: Current medicines are reviewed at length with the patient today.  Concerns regarding medicines are outlined above.  Medication changes, Labs and Tests ordered today are listed in the Patient Instructions below. Patient Instructions  Your physician recommends that you continue on your current medications as directed. Please refer to the Current Medication list given  to you today.  Your physician recommends that you return for lab work in:  [facility]ICE [address], [place] [address] Dept: [phone] Loc: [place]  03/21/2018  You are scheduled for a Cardiac Catheterization on Tuesday, April 2 with Dr. Larae Grooms.  1. Please arrive at the [facility] (Main Entrance A) at [facility]: [address] [place],  [address] at 5:30 AM (two hours before your procedure to ensure your preparation). Free valet parking service is available.   Special note: Every effort is made to have your procedure done on time. Please understand that emergencies sometimes delay scheduled procedures.  2. Diet: Do not eat or drink anything after midnight prior to your procedure except sips of water to take medications.  3. Labs: You will need to have blood drawn on Thursday, March 28 at [facility] at [facility]. [address]  Open: 7:30am - 5pm    Phone: [phone]. You do not need to be fasting.  4. Medication instructions in preparation for your procedure:   Current Outpatient Medications (Endocrine & Metabolic):  .  metFORMIN (GLUCOPHAGE) 500 MG tablet, Take 1 tablet (500 mg total) by mouth 2 (two) times daily with a meal.  Current Outpatient Medications (Cardiovascular):  .  lisinopril-hydrochlorothiazide (PRINZIDE,ZESTORETIC) 20-12.5 MG tablet, TAKE ONE TABLET BY MOUTH ONCE DAILY .  lovastatin (MEVACOR) 20 MG tablet, Take 1 tablet (20 mg total) by mouth at bedtime.   Current Outpatient Medications (Analgesics):  .  aspirin 81 MG tablet, Take 81 mg by mouth daily.     Current Outpatient Medications (Other):  Marland Kitchen  Lysine HCl 1000 MG TABS, Take 1 tablet by mouth daily.   .  magnesium 30 MG tablet, Take 250 mg by mouth daily. .  Melatonin 3 MG CAPS, Take by  mouth as needed. Marland Kitchen  Multiple Vitamin (MULTIVITAMIN) tablet, Take 1 tablet by mouth daily.   .  Multiple Vitamins-Minerals (AIRBORNE GUMMIES PO), Take by mouth daily.  .  NON FORMULARY, Legatrin PM-Take one daily .  SUPER B COMPLEX/C PO, Take by mouth daily. .  valACYclovir (VALTREX) 1000 MG tablet, TAKE TWO TABLETS BY MOUTH TWICE DAILY FOR 1 DAY AS NEEDED *For reference purposes while preparing patient instructions.   Delete this med list prior to printing instructions for patient.*    Stop taking, Glucophage (Metformin) on Monday, April 1.    24 HOURS BEFORE AND 48 HOURS AFTER   On the morning of your procedure, take your Aspirin and any morning medicines NOT listed above.  You may use sips of water.  5. Plan for one night stay--bring personal belongings. 6. Bring a current list of your medications and current insurance cards. 7. You MUST have a responsible person to drive you home. 8. Someone MUST be with you the first 24 hours after you arrive home or your discharge will be delayed. 9. Please wear clothes that are easy to get on and off and wear slip-on shoes.  Thank you for allowing [place] to care for you!   -- [facility] Invasive Cardiovascular services    Signed, Leanor Kail, [place]  03/21/2018 3:30 PM    [place], [place], [place]  [address] Phone: [phone]; Fax: [phone]

## 2018-03-22 ENCOUNTER — Telehealth: Payer: Self-pay | Admitting: *Deleted

## 2018-03-22 LAB — CBC WITH DIFFERENTIAL/PLATELET
BASOS: 0 %
Basophils Absolute: 0 10*3/uL (ref 0.0–0.2)
EOS (ABSOLUTE): 0.3 10*3/uL (ref 0.0–0.4)
Eos: 4 %
Hematocrit: 37.2 % — ABNORMAL LOW (ref 37.5–51.0)
Hemoglobin: 12.8 g/dL — ABNORMAL LOW (ref 13.0–17.7)
IMMATURE GRANULOCYTES: 0 %
Immature Grans (Abs): 0 10*3/uL (ref 0.0–0.1)
LYMPHS ABS: 1.5 10*3/uL (ref 0.7–3.1)
Lymphs: 23 %
MCH: 30.8 pg (ref 26.6–33.0)
MCHC: 34.4 g/dL (ref 31.5–35.7)
MCV: 90 fL (ref 79–97)
MONOS ABS: 0.6 10*3/uL (ref 0.1–0.9)
Monocytes: 9 %
NEUTROS ABS: 4 10*3/uL (ref 1.4–7.0)
NEUTROS PCT: 64 %
PLATELETS: 211 10*3/uL (ref 150–379)
RBC: 4.15 x10E6/uL (ref 4.14–5.80)
RDW: 13.8 % (ref 12.3–15.4)
WBC: 6.3 10*3/uL (ref 3.4–10.8)

## 2018-03-22 LAB — BASIC METABOLIC PANEL
BUN / CREAT RATIO: 21 (ref 10–24)
BUN: 22 mg/dL (ref 8–27)
CHLORIDE: 101 mmol/L (ref 96–106)
CO2: 23 mmol/L (ref 20–29)
Calcium: 9.4 mg/dL (ref 8.6–10.2)
Creatinine, Ser: 1.04 mg/dL (ref 0.76–1.27)
GFR calc non Af Amer: 72 mL/min/{1.73_m2} (ref >59)
GFR, EST AFRICAN AMERICAN: 83 mL/min/{1.73_m2} (ref >59)
Glucose: 145 mg/dL — ABNORMAL HIGH (ref 65–99)
POTASSIUM: 4.2 mmol/L (ref 3.5–5.2)
SODIUM: 140 mmol/L (ref 134–144)

## 2018-03-22 LAB — PROTIME-INR
INR: 1 (ref 0.8–1.2)
Prothrombin Time: 10.4 s (ref 9.1–12.0)

## 2018-03-22 MED ORDER — NITROGLYCERIN 0.4 MG SL SUBL: 0.4000 mg | SUBLINGUAL_TABLET | SUBLINGUAL | 3 refills | Status: DC | PRN

## 2018-03-22 NOTE — Telephone Encounter (Signed)
Patient called and stated that he was seen here yesterday and was informed that a new rx would be sent in for him, wife stated that she believes that it was nitro. They inquired about this with [facility] but they were informed that nothing was received from our office. I do not see where any new order was placed in epic for the patient. Please advise. Thanks, MI

## 2018-03-22 NOTE — Progress Notes (Signed)
Pt has been made aware of normal result and verbalized understanding.  jw 03/22/18

## 2018-03-22 NOTE — Telephone Encounter (Signed)
Pt seen by Robbie Lis, PA-C yesterday and he did mention PRN Nitro in his note.  Spoke with pt and advised I would send this in to pharmacy.  Educated pt on proper use of Nitro and side effects.  Pt verbalized understanding and was appreciative for call.

## 2018-03-25 ENCOUNTER — Telehealth: Payer: Self-pay | Admitting: *Deleted

## 2018-03-25 NOTE — Telephone Encounter (Signed)
Pt contacted pre-catheterization scheduled at [facility] for: Tuesday April 2,2019 7:30 AM Verified arrival time and place: [place] Entrance A/[place] at: 5:30 AM Nothing to eat or drink after midnight prior to cath.  Hold: Metformin 03/25/18, 03/26/18, and 48 hours post cath. Lisinopril /HCTZ AM of cath.  Verified AM meds can be  taken pre-cath with sip of water including: ASA 81 mg  Confirmed patient has responsible person to drive home post procedure and observe patient for 24 hours: yes

## 2018-03-26 ENCOUNTER — Other Ambulatory Visit: Payer: Self-pay

## 2018-03-26 ENCOUNTER — Ambulatory Visit (HOSPITAL_COMMUNITY)
Admission: RE | Admit: 2018-03-26 | Discharge: 2018-03-26 | Disposition: A | Discharge status: Home / Self Care | Payer: Medicare HMO | Source: Ambulatory Visit | Attending: Interventional Cardiology | Admitting: Interventional Cardiology

## 2018-03-26 ENCOUNTER — Encounter (HOSPITAL_COMMUNITY)
Admission: RE | Disposition: A | Discharge status: Home / Self Care | Payer: Self-pay | Source: Ambulatory Visit | Attending: Interventional Cardiology

## 2018-03-26 DIAGNOSIS — Z8249 Family history of ischemic heart disease and other diseases of the circulatory system: Secondary | ICD-10-CM | POA: Insufficient documentation

## 2018-03-26 DIAGNOSIS — Z7984 Long term (current) use of oral hypoglycemic drugs: Secondary | ICD-10-CM | POA: Diagnosis not present

## 2018-03-26 DIAGNOSIS — E785 Hyperlipidemia, unspecified: Secondary | ICD-10-CM | POA: Insufficient documentation

## 2018-03-26 DIAGNOSIS — Z7982 Long term (current) use of aspirin: Secondary | ICD-10-CM | POA: Diagnosis not present

## 2018-03-26 DIAGNOSIS — I2510 Atherosclerotic heart disease of native coronary artery without angina pectoris: Secondary | ICD-10-CM | POA: Insufficient documentation

## 2018-03-26 DIAGNOSIS — R9439 Abnormal result of other cardiovascular function study: Secondary | ICD-10-CM

## 2018-03-26 DIAGNOSIS — I10 Essential (primary) hypertension: Secondary | ICD-10-CM | POA: Insufficient documentation

## 2018-03-26 DIAGNOSIS — E1142 Type 2 diabetes mellitus with diabetic polyneuropathy: Secondary | ICD-10-CM | POA: Diagnosis not present

## 2018-03-26 HISTORY — PX: LEFT HEART CATH AND CORONARY ANGIOGRAPHY: CATH118249

## 2018-03-26 HISTORY — PX: ULTRASOUND GUIDANCE FOR VASCULAR ACCESS: SHX6516

## 2018-03-26 LAB — GLUCOSE, CAPILLARY
GLUCOSE-CAPILLARY: 113 mg/dL — AB (ref 65–99)
Glucose-Capillary: 125 mg/dL — ABNORMAL HIGH (ref 65–99)

## 2018-03-26 SURGERY — LEFT HEART CATH AND CORONARY ANGIOGRAPHY
Anesthesia: LOCAL

## 2018-03-26 MED ORDER — SODIUM CHLORIDE 0.9 % IV SOLN: 250.0000 mL | INTRAVENOUS | Status: DC | PRN

## 2018-03-26 MED ORDER — SODIUM CHLORIDE 0.9 % WEIGHT BASED INFUSION
1.0000 mL/kg/h | INTRAVENOUS | Status: DC
  Administered 2018-03-26: 999 mL/h via INTRAVENOUS

## 2018-03-26 MED ORDER — HEPARIN SODIUM (PORCINE) 1000 UNIT/ML IJ SOLN
INTRAMUSCULAR | Status: AC
  Filled 2018-03-26: qty 1

## 2018-03-26 MED ORDER — HEPARIN (PORCINE) IN NACL 2-0.9 UNIT/ML-% IJ SOLN
INTRAMUSCULAR | Status: DC | PRN
  Administered 2018-03-26: 500 mL

## 2018-03-26 MED ORDER — MIDAZOLAM HCL 2 MG/2ML IJ SOLN
INTRAMUSCULAR | Status: DC | PRN
  Administered 2018-03-26 (×2): 1 mg via INTRAVENOUS

## 2018-03-26 MED ORDER — SODIUM CHLORIDE 0.9% FLUSH: 3.0000 mL | Freq: Two times a day (BID) | INTRAVENOUS | Status: DC

## 2018-03-26 MED ORDER — ASPIRIN 81 MG PO CHEW: 81.0000 mg | CHEWABLE_TABLET | ORAL | Status: DC

## 2018-03-26 MED ORDER — DIAZEPAM 5 MG PO TABS
ORAL_TABLET | ORAL | Status: AC
  Filled 2018-03-26: qty 1

## 2018-03-26 MED ORDER — IOHEXOL 350 MG/ML SOLN
INTRAVENOUS | Status: DC | PRN
  Administered 2018-03-26: 60 mL via INTRAVENOUS

## 2018-03-26 MED ORDER — METFORMIN HCL 500 MG PO TABS: 500.0000 mg | ORAL_TABLET | Freq: Two times a day (BID) | ORAL | 1 refills | Status: DC

## 2018-03-26 MED ORDER — HEPARIN (PORCINE) IN NACL 2-0.9 UNIT/ML-% IJ SOLN
INTRAMUSCULAR | Status: DC | PRN
  Administered 2018-03-26: 10 mL via INTRA_ARTERIAL

## 2018-03-26 MED ORDER — NITROGLYCERIN 1 MG/10 ML FOR IR/CATH LAB
INTRA_ARTERIAL | Status: AC
  Filled 2018-03-26: qty 10

## 2018-03-26 MED ORDER — NITROGLYCERIN 1 MG/10 ML FOR IR/CATH LAB
INTRA_ARTERIAL | Status: DC | PRN
  Administered 2018-03-26: 200 ug via INTRA_ARTERIAL

## 2018-03-26 MED ORDER — SODIUM CHLORIDE 0.9% FLUSH: 3.0000 mL | INTRAVENOUS | Status: DC | PRN

## 2018-03-26 MED ORDER — FENTANYL CITRATE (PF) 100 MCG/2ML IJ SOLN
INTRAMUSCULAR | Status: AC
  Filled 2018-03-26: qty 2

## 2018-03-26 MED ORDER — ACETAMINOPHEN 325 MG PO TABS: 650.0000 mg | ORAL_TABLET | ORAL | Status: DC | PRN

## 2018-03-26 MED ORDER — DIAZEPAM 5 MG PO TABS
5.0000 mg | ORAL_TABLET | Freq: Once | ORAL | Status: AC
  Administered 2018-03-26: 5 mg via ORAL

## 2018-03-26 MED ORDER — FENTANYL CITRATE (PF) 100 MCG/2ML IJ SOLN
INTRAMUSCULAR | Status: DC | PRN
  Administered 2018-03-26: 25 ug via INTRAVENOUS

## 2018-03-26 MED ORDER — SODIUM CHLORIDE 0.9 % WEIGHT BASED INFUSION
3.0000 mL/kg/h | INTRAVENOUS | Status: DC
  Administered 2018-03-26: 3 mL/kg/h via INTRAVENOUS

## 2018-03-26 MED ORDER — SODIUM CHLORIDE 0.9 % IV SOLN: INTRAVENOUS | Status: AC

## 2018-03-26 MED ORDER — HEPARIN (PORCINE) IN NACL 2-0.9 UNIT/ML-% IJ SOLN
INTRAMUSCULAR | Status: AC
  Filled 2018-03-26: qty 1000

## 2018-03-26 MED ORDER — LIDOCAINE HCL (PF) 1 % IJ SOLN
INTRAMUSCULAR | Status: DC | PRN
  Administered 2018-03-26: 15 mL
  Administered 2018-03-26: 2 mL

## 2018-03-26 MED ORDER — ONDANSETRON HCL 4 MG/2ML IJ SOLN: 4.0000 mg | Freq: Four times a day (QID) | INTRAMUSCULAR | Status: DC | PRN

## 2018-03-26 MED ORDER — MIDAZOLAM HCL 2 MG/2ML IJ SOLN
INTRAMUSCULAR | Status: AC
Start: 2018-03-26 — End: 2018-03-26
  Filled 2018-03-26: qty 2

## 2018-03-26 MED ORDER — VERAPAMIL HCL 2.5 MG/ML IV SOLN
INTRAVENOUS | Status: AC
  Filled 2018-03-26: qty 2

## 2018-03-26 MED ORDER — LIDOCAINE HCL (PF) 1 % IJ SOLN
INTRAMUSCULAR | Status: AC
  Filled 2018-03-26: qty 30

## 2018-03-26 SURGICAL SUPPLY — 23 items
BAND CMPR LRG ZPHR (HEMOSTASIS) ×2
BAND ZEPHYR COMPRESS 30 LONG (HEMOSTASIS) ×1 IMPLANT
CATH IMPULSE 5F ANG/FL3.5 (CATHETERS) ×1 IMPLANT
CATH INFINITI 5FR JL4 (CATHETERS) ×1 IMPLANT
CATH INFINITI JR4 5F (CATHETERS) ×1 IMPLANT
COVER PRB 48X5XTLSCP FOLD TPE (BAG) IMPLANT
COVER PROBE 5X48 (BAG) ×3
GUIDEWIRE INQWIRE 1.5J.035X260 (WIRE) IMPLANT
INQWIRE 1.5J .035X260CM (WIRE) ×3
KIT ESSENTIALS PG (KITS) ×1 IMPLANT
KIT HEART LEFT (KITS) ×3 IMPLANT
NDL PERC 21GX4CM (NEEDLE) IMPLANT
NEEDLE PERC 21GX4CM (NEEDLE) ×3 IMPLANT
PACK CARDIAC CATHETERIZATION (CUSTOM PROCEDURE TRAY) ×3 IMPLANT
SHEATH AVANTI 11CM 5FR (SHEATH) ×1 IMPLANT
SHEATH RAIN RADIAL 21G 6FR (SHEATH) ×1 IMPLANT
SYR MEDRAD MARK V 150ML (SYRINGE) ×3 IMPLANT
TRANSDUCER W/STOPCOCK (MISCELLANEOUS) ×3 IMPLANT
TUBING CIL FLEX 10 FLL-RA (TUBING) ×3 IMPLANT
WIRE ASAHI PROWATER 180CM (WIRE) ×1 IMPLANT
WIRE EMERALD 3MM-J .035X150CM (WIRE) ×2 IMPLANT
WIRE HI TORQ BMW 190CM (WIRE) ×1 IMPLANT
WIRE HI TORQ VERSACORE-J 145CM (WIRE) ×1 IMPLANT

## 2018-03-26 NOTE — Interval H&P Note (Signed)
Cath Lab Visit (complete for each Cath Lab visit)  Clinical Evaluation Leading to the Procedure:   ACS: No.  Non-ACS:    Anginal Classification: CCS III  Anti-ischemic medical therapy: Minimal Therapy (1 class of medications)  Non-Invasive Test Results: High-risk stress test findings: cardiac mortality >3%/year  Prior CABG: No previous CABG      History and Physical Interval Note:  03/26/2018 7:24 AM  William Brewer  has presented today for surgery, with the diagnosis of abnormal myoview  The various methods of treatment have been discussed with the patient and family. After consideration of risks, benefits and other options for treatment, the patient has consented to  Procedure(s): LEFT HEART CATH AND CORONARY ANGIOGRAPHY (N/A) as a surgical intervention .  The patient's history has been reviewed, patient examined, no change in status, stable for surgery.  I have reviewed the patient's chart and labs.  Questions were answered to the patient's satisfaction.     Larae Grooms

## 2018-03-26 NOTE — Discharge Instructions (Signed)
Radial Site Care °Refer to this sheet in the next few weeks. These instructions provide you with information about caring for yourself after your procedure. Your health care provider may also give you more specific instructions. Your treatment has been planned according to current medical practices, but problems sometimes occur. Call your health care provider if you have any problems or questions after your procedure. °What can I expect after the procedure? °After your procedure, it is typical to have the following: °· Bruising at the radial site that usually fades within 1-2 weeks. °· Blood collecting in the tissue (hematoma) that may be painful to the touch. It should usually decrease in size and tenderness within 1-2 weeks. ° °Follow these instructions at home: °· Take medicines only as directed by your health care provider. °· You may shower 24-48 hours after the procedure or as directed by your health care provider. Remove the bandage (dressing) and gently wash the site with plain soap and water. Pat the area dry with a clean towel. Do not rub the site, because this may cause bleeding. °· Do not take baths, swim, or use a hot tub until your health care provider approves. °· Check your insertion site every day for redness, swelling, or drainage. °· Do not apply powder or lotion to the site. °· Do not flex or bend the affected arm for 24 hours or as directed by your health care provider. °· Do not push or pull heavy objects with the affected arm for 24 hours or as directed by your health care provider. °· Do not lift over 10 lb (4.5 kg) for 5 days after your procedure or as directed by your health care provider. °· Ask your health care provider when it is okay to: °? Return to work or school. °? Resume usual physical activities or sports. °? Resume sexual activity. °· Do not drive home if you are discharged the same day as the procedure. Have someone else drive you. °· You may drive 24 hours after the procedure  unless otherwise instructed by your health care provider. °· Do not operate machinery or power tools for 24 hours after the procedure. °· If your procedure was done as an outpatient procedure, which means that you went home the same day as your procedure, a responsible adult should be with you for the first 24 hours after you arrive home. °· Keep all follow-up visits as directed by your health care provider. This is important. °Contact a health care provider if: °· You have a fever. °· You have chills. °· You have increased bleeding from the radial site. Hold pressure on the site. °Get help right away if: °· You have unusual pain at the radial site. °· You have redness, warmth, or swelling at the radial site. °· You have drainage (other than a small amount of blood on the dressing) from the radial site. °· The radial site is bleeding, and the bleeding does not stop after 30 minutes of holding steady pressure on the site. °· Your arm or hand becomes pale, cool, tingly, or numb. °This information is not intended to replace advice given to you by your health care provider. Make sure you discuss any questions you have with your health care provider. °Document Released: 01/13/2011 Document Revised: 05/18/2016 Document Reviewed: 06/29/2014 °Elsevier Interactive Patient Education © 2018 [employer] °Femoral Site Care °Refer to this sheet in the next few weeks. These instructions provide you with information about caring for yourself after your procedure. Your   health care provider may also give you more specific instructions. Your treatment has been planned according to current medical practices, but problems sometimes occur. Call your health care provider if you have any problems or questions after your procedure. °What can I expect after the procedure? °After your procedure, it is typical to have the following: °· Bruising at the site that usually fades within 1-2 weeks. °· Blood collecting in the tissue (hematoma) that  may be painful to the touch. It should usually decrease in size and tenderness within 1-2 weeks. ° °Follow these instructions at home: °· Take medicines only as directed by your health care provider. °· You may shower 24-48 hours after the procedure or as directed by your health care provider. Remove the bandage (dressing) and gently wash the site with plain soap and water. Pat the area dry with a clean towel. Do not rub the site, because this may cause bleeding. °· Do not take baths, swim, or use a hot tub until your health care provider approves. °· Check your insertion site every day for redness, swelling, or drainage. °· Do not apply powder or lotion to the site. °· Limit use of stairs to twice a day for the first 2-3 days or as directed by your health care provider. °· Do not squat for the first 2-3 days or as directed by your health care provider. °· Do not lift over 10 lb (4.5 kg) for 5 days after your procedure or as directed by your health care provider. °· Ask your health care provider when it is okay to: °? Return to work or school. °? Resume usual physical activities or sports. °? Resume sexual activity. °· Do not drive home if you are discharged the same day as the procedure. Have someone else drive you. °· You may drive 24 hours after the procedure unless otherwise instructed by your health care provider. °· Do not operate machinery or power tools for 24 hours after the procedure or as directed by your health care provider. °· If your procedure was done as an outpatient procedure, which means that you went home the same day as your procedure, a responsible adult should be with you for the first 24 hours after you arrive home. °· Keep all follow-up visits as directed by your health care provider. This is important. °Contact a health care provider if: °· You have a fever. °· You have chills. °· You have increased bleeding from the site. Hold pressure on the site. °Get help right away if: °· You have  unusual pain at the site. °· You have redness, warmth, or swelling at the site. °· You have drainage (other than a small amount of blood on the dressing) from the site. °· The site is bleeding, and the bleeding does not stop after 30 minutes of holding steady pressure on the site. °· Your leg or foot becomes pale, cool, tingly, or numb. °This information is not intended to replace advice given to you by your health care provider. Make sure you discuss any questions you have with your health care provider. °Document Released: 08/14/2014 Document Revised: 05/18/2016 Document Reviewed: 06/30/2014 °Elsevier Interactive Patient Education © 2018 Elsevier Inc. ° °

## 2018-03-26 NOTE — Progress Notes (Addendum)
Site area: RFA Site Prior to Removal:  Level 0 Pressure Applied For: 20 min Manual:   yes Patient Status During Pull: stable  Post Pull Site:  Level 0 Post Pull Instructions Given: yes  Post Pull Pulses Present: palpable Dressing Applied: tegaderm  Bedrest begins @ 3343 till 1305 CommentsA:

## 2018-03-27 ENCOUNTER — Encounter (HOSPITAL_COMMUNITY): Payer: Self-pay | Admitting: Interventional Cardiology

## 2018-03-27 ENCOUNTER — Telehealth: Payer: Self-pay | Admitting: Cardiology

## 2018-03-27 NOTE — Telephone Encounter (Signed)
   Dist RCA lesion is 50% stenosed.  Mid Cx lesion is 40% stenosed.  Mid LAD lesion is 25% stenosed.  The left ventricular ejection fraction is 50-55% by visual estimate.  LV end diastolic pressure is low.  The left ventricular systolic function is normal.  There is no aortic valve stenosis.  Ost 1st Diag lesion is 75% stenosed.  Right radial loop noted by angiogram. Would not use right radial approach in the future if cath was needed.   Nonobstructive coronary artery disease.  Continue aggressive medical therapy.    Pt is calling to ask Dr Meda Coffee if she agrees that he continue taking his current statin regimen as Dr Yong Channel PCP has prescribed it, or should she change his regimen around. Pt states he was viewing his cath report on mychart, and Dr Irish Lack noted that he has non-obstructive CAD, and recommends that he continue aggressive medical therapy.  Pt just wants to clarify that he is on the right regimen.  Informed the pt that Dr Meda Coffee is out of the office today, but I will route this message to her for further review and recommendation and follow-up with the pt thereafter. Pt verbalized understanding and agrees with this plan.

## 2018-03-27 NOTE — Telephone Encounter (Signed)
°  New message  Pt verbalized that he is calling for RN  For results 03/26/2018 for the Cath procedure

## 2018-03-28 MED FILL — Heparin Sodium (Porcine) 2 Unit/ML in Sodium Chloride 0.9%: INTRAMUSCULAR | Qty: 1000 | Status: AC

## 2018-05-09 ENCOUNTER — Other Ambulatory Visit: Payer: Self-pay

## 2018-05-09 MED ORDER — METFORMIN HCL 500 MG PO TABS: 500.0000 mg | ORAL_TABLET | Freq: Two times a day (BID) | ORAL | 1 refills | Status: DC

## 2018-05-23 ENCOUNTER — Ambulatory Visit: Payer: Medicare HMO | Admitting: Physician Assistant

## 2018-05-23 ENCOUNTER — Encounter: Payer: Self-pay | Admitting: Physician Assistant

## 2018-05-23 VITALS — BP 120/80 | HR 84 | Ht 72.0 in | Wt 194.2 lb

## 2018-05-23 DIAGNOSIS — E119 Type 2 diabetes mellitus without complications: Secondary | ICD-10-CM | POA: Diagnosis not present

## 2018-05-23 DIAGNOSIS — I10 Essential (primary) hypertension: Secondary | ICD-10-CM

## 2018-05-23 DIAGNOSIS — I2510 Atherosclerotic heart disease of native coronary artery without angina pectoris: Secondary | ICD-10-CM

## 2018-05-23 DIAGNOSIS — E785 Hyperlipidemia, unspecified: Secondary | ICD-10-CM

## 2018-05-23 MED ORDER — LOVASTATIN 40 MG PO TABS: 20.0000 mg | ORAL_TABLET | Freq: Every day | ORAL | Status: DC

## 2018-05-23 MED ORDER — LOVASTATIN 40 MG PO TABS: 40.0000 mg | ORAL_TABLET | Freq: Every day | ORAL | 3 refills | Status: DC

## 2018-05-23 NOTE — Patient Instructions (Signed)
Medication Instructions:  1. START TAKING YOUR LOVASTATIN 20 MG EVERY DAY; NEW RX HAS BEEN SENT IN  Labwork: YOU WILL NEED TO HAVE FASTING LIPID AND LIVER PANEL TO BE DONE WITH YOUR PRIMARY CARE IN 07/2018  Testing/Procedures: NONE ORDERED TODAY  Follow-Up: 6 MONTHS WITH DR. Meda Coffee ; WE WILL SEND OUT A REMINDER LETTER A FEW MONTHS EARLIER  Any Other Special Instructions Will Be Listed Below (If Applicable).     If you need a refill on your cardiac medications before your next appointment, please call your pharmacy.

## 2018-05-23 NOTE — Progress Notes (Signed)
I called the pt to advised the Rx sent in for the Lovastatin was sent in accidentally for 40 mg and not the 20 mg tablet which pt was on. I advised pt to cut lovastatin 40 mg in 1/2 = 20 mg daily. Pt thanked me for the call. Med list has been changed to reflect the change.

## 2018-05-23 NOTE — Addendum Note (Signed)
Addended by: Michae Kava on: 05/23/2018 03:41 PM   Modules accepted: Orders

## 2018-05-23 NOTE — Progress Notes (Signed)
Cardiology Office Note:    Date:  05/23/2018   ID:  William, Brewer 15-Dec-1946, MRN [id number]  PCP:  Marin Olp, MD  Cardiologist:  Ena Dawley, MD   Referring MD: Marin Olp, MD   Chief Complaint  Patient presents with  . Coronary Artery Disease    outpatient heart cath    History of Present Illness:    William Brewer is a 72 y.o. male with a hx of HTN, HLD, DM2, and family history of CAD. He was seen in clinic by Dr. Meda Brewer on 02/20/18 with symptoms concerning for angina. She ordered a CT coronary, which was declined by insurance. He then had an exercise myoview that was read as high risk - medium defect in the basal inferior, basal inferolateral, and mid inferior and mid inferolateral location that was partially reversible. He was then sent for heart catheterization.   He presented for heart cath on 03/26/18, which showed nonobstructive coronary artery disease. Aggressive medical therapy was recommended.   He is on ASA, lovastatin, and lisinopril-HCTZ.     Past Medical History:  Diagnosis Date  . Allergy   . Basal cell carcinoma    leg  . Colon polyps   . Diabetes mellitus without complication ([place])   . DIVERTICULOSIS, COLON 05/26/2008  . Hyperlipidemia    refused treatment  . Hypertension   . Peripheral neuropathy     Past Surgical History:  Procedure Laterality Date  . APPENDECTOMY    . COLONOSCOPY    . LEFT HEART CATH AND CORONARY ANGIOGRAPHY N/A 03/26/2018   Procedure: LEFT HEART CATH AND CORONARY ANGIOGRAPHY;  Surgeon: Jettie Booze, MD;  Location: [facility];  Service: Cardiovascular;  Laterality: N/A;  . POLYPECTOMY    . ULTRASOUND GUIDANCE FOR VASCULAR ACCESS  03/26/2018   Procedure: Ultrasound Guidance For Vascular Access;  Surgeon: Jettie Booze, MD;  Location: [facility];  Service: Cardiovascular;;    Current Medications: Current Meds  Medication Sig  . aspirin 81 MG tablet Take 81 mg by mouth daily.      Marland Kitchen ibuprofen (ADVIL,MOTRIN) 200 MG tablet Take 200 mg by mouth every 6 (six) hours as needed.  Marland Kitchen lisinopril-hydrochlorothiazide (PRINZIDE,ZESTORETIC) 20-12.5 MG tablet TAKE ONE TABLET BY MOUTH ONCE DAILY  . Lysine HCl 1000 MG TABS Take 1,000 mg by mouth daily.   . Magnesium 400 MG TABS Take 400 mg by mouth daily.   . Melatonin 10 MG CAPS Take 10 mg by mouth at bedtime as needed (for sleep).   . metFORMIN (GLUCOPHAGE) 500 MG tablet Take 1 tablet (500 mg total) by mouth 2 (two) times daily with a meal.  . Multiple Vitamins-Minerals (AIRBORNE GUMMIES PO) Take 2 each by mouth daily.   . Multiple Vitamins-Minerals (ONE-A-DAY MENS 50+ ADVANTAGE PO) Take 1 tablet by mouth daily.  . nitroGLYCERIN (NITROSTAT) 0.4 MG SL tablet Place 1 tablet (0.4 mg total) under the tongue every 5 (five) minutes as needed for chest pain.  . NON FORMULARY Take 1 tablet by mouth at bedtime. Legatrin PM  . valACYclovir (VALTREX) 1000 MG tablet TAKE TWO TABLETS BY MOUTH TWICE DAILY FOR 1 DAY AS NEEDED (Patient taking differently: Take 1,000-2,000 mg by mouth daily as needed (for outbreak). )  . [DISCONTINUED] lovastatin (MEVACOR) 20 MG tablet Take 1 tablet (20 mg total) by mouth at bedtime. (Patient taking differently: Take 20 mg by mouth See admin instructions. Take 20 mg by mouth daily on Monday and Friday)  Allergies:   Patient has no known allergies.   Social History   Socioeconomic History  . Marital status: Married    Spouse name: Not on file  . Number of children: Not on file  . Years of education: Not on file  . Highest education level: Not on file  Occupational History  . Not on file  Social Needs  . Financial resource strain: Not on file  . Food insecurity:    Worry: Not on file    Inability: Not on file  . Transportation needs:    Medical: Not on file    Non-medical: Not on file  Tobacco Use  . Smoking status: Never Smoker  . Smokeless tobacco: Never Used  Substance and Sexual Activity  .  Alcohol use: Yes    Alcohol/week: 3.0 - 3.6 oz    Types: 3 Cans of beer, 2 - 3 Standard drinks or equivalent per week    Comment: 3 a week   . Drug use: No  . Sexual activity: Not on file  Lifestyle  . Physical activity:    Days per week: Not on file    Minutes per session: Not on file  . Stress: Not on file  Relationships  . Social connections:    Talks on phone: Not on file    Gets together: Not on file    Attends religious service: Not on file    Active member of club or organization: Not on file    Attends meetings of clubs or organizations: Not on file    Relationship status: Not on file  Other Topics Concern  . Not on file  Social History Narrative   Married 1973. 2 children. 2 grandkids. Son in [place]. Daughter 5 minutes away.       Retired Worked form Administrator, arts: support group for peripheral neuropathy, chess, words with friends, active with [employer] college (board of advisors)     Family History: The patient's family history includes Cancer in his mother; Deep vein thrombosis in his mother; Heart disease (age of onset: 63) in his father. There is no history of Hypertension or Colon cancer.  ROS:   Please see the history of present illness.    All other systems reviewed and are negative.  EKGs/Labs/Other Studies Reviewed:    The following studies were reviewed today:  Heart cath 03/26/18:  Dist RCA lesion is 50% stenosed.  Mid Cx lesion is 40% stenosed.  Mid LAD lesion is 25% stenosed.  The left ventricular ejection fraction is 50-55% by visual estimate.  LV end diastolic pressure is low.  The left ventricular systolic function is normal.  There is no aortic valve stenosis.  Ost 1st Diag lesion is 75% stenosed.  Right radial loop noted by angiogram. Would not use right radial approach in the future if cath was needed.   Nonobstructive coronary artery disease.  Continue aggressive medical  therapy.   Myoview 03/20/18:  Nuclear stress EF: 61%.  Blood pressure demonstrated a normal response to exercise.  There was no ST segment deviation noted during stress.  There is a medium defect of moderate severity present in the basal inferior, basal inferolateral, mid inferior and mid inferolateral location. The defect is partially reversible and consistent with infarct and peri infarct ischemia.  This is a high risk study.  The left ventricular ejection fraction is normal (55-65%) with inferior hypokinesis  Recommend cardiac catheterization.   EKG:  EKG is not  ordered today.   Recent Labs: 02/12/2018: ALT 31; TSH 3.89 03/21/2018: BUN 22; Creatinine, Ser 1.04; Hemoglobin 12.8; Platelets 211; Potassium 4.2; Sodium 140  Recent Lipid Panel    Component Value Date/Time   CHOL 150 02/12/2018 0845   TRIG 237.0 (H) 02/12/2018 0845   HDL 28.00 (L) 02/12/2018 0845   CHOLHDL 5 02/12/2018 0845   VLDL 47.4 (H) 02/12/2018 0845   LDLCALC 118 (H) 06/15/2016 0951   LDLDIRECT 91.0 02/12/2018 0845    Physical Exam:    VS:  BP 120/80   Pulse 84   Ht 6' (1.829 m)   Wt 194 lb 3.2 oz (88.1 kg)   SpO2 95%   BMI 26.34 kg/m     Wt Readings from Last 3 Encounters:  05/23/18 194 lb 3.2 oz (88.1 kg)  03/26/18 190 lb (86.2 kg)  03/21/18 190 lb (86.2 kg)     GEN:  Well nourished, well developed in no acute distress HEENT: Normal NECK: No JVD; No carotid bruits CARDIAC: RRR, no murmurs, rubs, gallops RESPIRATORY:  Clear to auscultation without rales, wheezing or rhonchi  ABDOMEN: Soft, non-tender, non-distended MUSCULOSKELETAL:  No edema; No deformity right radial site C/D/I SKIN: Warm and dry NEUROLOGIC:  Alert and oriented x 3 PSYCHIATRIC:  Normal affect   ASSESSMENT:    1. Nonobstructive atherosclerosis of coronary artery   2. Essential hypertension   3. Hyperlipidemia, unspecified hyperlipidemia type   4. Controlled type 2 diabetes mellitus without complication, without  long-term current use of insulin (HCC)    PLAN:    In order of problems listed above:  Nonobstructive atherosclerosis of coronary artery No intervention, medical therapy recommended. He denies chest pain. We discussed tight control of his cholesterol and diabetes to prevent progression of his CAD.   Essential hypertension He does not take his pressure at home. Pressure is well-controlled today. Asked him to take his pressure at [facility] or [facility].   Hyperlipidemia, unspecified hyperlipidemia type, LDL goal less than 70 02/12/2018: Cholesterol 150; HDL 28.00; Triglycerides 237.0; VLDL 47.4 LDL 91 on 02/12/18.  On lovastatin twice per week. He has not tried a statin daily and has had no side effects. His LDL goal is now less than 70. Will increase his lovastatin to daily. He would like to repeat his fasting lipids and LFTs when he sees his PCP Dr. Yong Channel in August. If LDL not at goal, would recommend switching to either crestor or lipitor.    Controlled type 2 diabetes mellitus without complication, without long-term current use of insulin (HCC) Last A1c 7.3%. Per PCP.  Again, discussed risk factor modification.    Will see him back in 6 months with Dr. Meda Brewer. If well at that visit, can likely go to yearly visits.    Medication Adjustments/Labs and Tests Ordered: Current medicines are reviewed at length with the patient today.  Concerns regarding medicines are outlined above.  No orders of the defined types were placed in this encounter.  Meds ordered this encounter  Medications  . lovastatin (MEVACOR) 40 MG tablet    Sig: Take 1 tablet (40 mg total) by mouth at bedtime.    Dispense:  90 tablet    Refill:  3    CHANGE IN THERAPY TO TAKE DAILY    Signed, Ledora Bottcher, [place]  05/23/2018 10:46 AM    [facility]

## 2018-07-01 DIAGNOSIS — R69 Illness, unspecified: Secondary | ICD-10-CM | POA: Diagnosis not present

## 2018-08-12 ENCOUNTER — Ambulatory Visit: Payer: Medicare HMO | Admitting: Family Medicine

## 2018-08-15 ENCOUNTER — Ambulatory Visit: Payer: Medicare HMO | Admitting: Family Medicine

## 2018-08-15 ENCOUNTER — Ambulatory Visit: Payer: Medicare HMO

## 2018-09-01 ENCOUNTER — Other Ambulatory Visit: Payer: Self-pay | Admitting: Family Medicine

## 2018-09-17 ENCOUNTER — Ambulatory Visit: Payer: Medicare HMO | Admitting: Family Medicine

## 2018-09-19 ENCOUNTER — Ambulatory Visit: Payer: Medicare HMO | Admitting: Family Medicine

## 2018-09-19 ENCOUNTER — Ambulatory Visit: Payer: Medicare HMO

## 2018-10-01 DIAGNOSIS — L821 Other seborrheic keratosis: Secondary | ICD-10-CM | POA: Diagnosis not present

## 2018-10-01 DIAGNOSIS — D1801 Hemangioma of skin and subcutaneous tissue: Secondary | ICD-10-CM | POA: Diagnosis not present

## 2018-10-01 DIAGNOSIS — L814 Other melanin hyperpigmentation: Secondary | ICD-10-CM | POA: Diagnosis not present

## 2018-10-01 DIAGNOSIS — Z85828 Personal history of other malignant neoplasm of skin: Secondary | ICD-10-CM | POA: Diagnosis not present

## 2018-11-08 ENCOUNTER — Encounter: Payer: Self-pay | Admitting: Family Medicine

## 2018-11-08 ENCOUNTER — Ambulatory Visit (INDEPENDENT_AMBULATORY_CARE_PROVIDER_SITE_OTHER): Payer: Medicare HMO | Admitting: Family Medicine

## 2018-11-08 ENCOUNTER — Ambulatory Visit: Payer: Medicare HMO | Admitting: Family Medicine

## 2018-11-08 ENCOUNTER — Ambulatory Visit: Payer: Medicare HMO

## 2018-11-08 VITALS — BP 122/74 | HR 85 | Resp 16 | Wt 196.0 lb

## 2018-11-08 DIAGNOSIS — F5101 Primary insomnia: Secondary | ICD-10-CM | POA: Diagnosis not present

## 2018-11-08 DIAGNOSIS — I2510 Atherosclerotic heart disease of native coronary artery without angina pectoris: Secondary | ICD-10-CM | POA: Diagnosis not present

## 2018-11-08 DIAGNOSIS — E1140 Type 2 diabetes mellitus with diabetic neuropathy, unspecified: Secondary | ICD-10-CM | POA: Diagnosis not present

## 2018-11-08 DIAGNOSIS — Z23 Encounter for immunization: Secondary | ICD-10-CM | POA: Diagnosis not present

## 2018-11-08 DIAGNOSIS — E785 Hyperlipidemia, unspecified: Secondary | ICD-10-CM

## 2018-11-08 DIAGNOSIS — G4700 Insomnia, unspecified: Secondary | ICD-10-CM | POA: Insufficient documentation

## 2018-11-08 DIAGNOSIS — L97501 Non-pressure chronic ulcer of other part of unspecified foot limited to breakdown of skin: Secondary | ICD-10-CM | POA: Diagnosis not present

## 2018-11-08 DIAGNOSIS — R69 Illness, unspecified: Secondary | ICD-10-CM | POA: Diagnosis not present

## 2018-11-08 DIAGNOSIS — Z6826 Body mass index (BMI) 26.0-26.9, adult: Secondary | ICD-10-CM

## 2018-11-08 DIAGNOSIS — E119 Type 2 diabetes mellitus without complications: Secondary | ICD-10-CM

## 2018-11-08 DIAGNOSIS — I10 Essential (primary) hypertension: Secondary | ICD-10-CM | POA: Diagnosis not present

## 2018-11-08 LAB — COMPREHENSIVE METABOLIC PANEL
ALK PHOS: 61 U/L (ref 39–117)
ALT: 36 U/L (ref 0–53)
AST: 28 U/L (ref 0–37)
Albumin: 4.6 g/dL (ref 3.5–5.2)
BILIRUBIN TOTAL: 0.6 mg/dL (ref 0.2–1.2)
BUN: 28 mg/dL — ABNORMAL HIGH (ref 6–23)
CALCIUM: 10.2 mg/dL (ref 8.4–10.5)
CO2: 26 mEq/L (ref 19–32)
Chloride: 103 mEq/L (ref 96–112)
Creatinine, Ser: 1.37 mg/dL (ref 0.40–1.50)
GFR: 54.27 mL/min — AB (ref >60.00)
GLUCOSE: 145 mg/dL — AB (ref 70–99)
Potassium: 4.2 mEq/L (ref 3.5–5.1)
Sodium: 139 mEq/L (ref 135–145)
TOTAL PROTEIN: 7.2 g/dL (ref 6.0–8.3)

## 2018-11-08 LAB — LDL CHOLESTEROL, DIRECT: LDL DIRECT: 97 mg/dL

## 2018-11-08 LAB — LIPID PANEL
Cholesterol: 162 mg/dL (ref 0–200)
HDL: 30.2 mg/dL — AB (ref >39.00)
NonHDL: 132.29
Total CHOL/HDL Ratio: 5
Triglycerides: 257 mg/dL — ABNORMAL HIGH (ref 0.0–149.0)
VLDL: 51.4 mg/dL — ABNORMAL HIGH (ref 0.0–40.0)

## 2018-11-08 LAB — HEMOGLOBIN A1C: HEMOGLOBIN A1C: 7.7 % — AB (ref 4.6–6.5)

## 2018-11-08 MED ORDER — TRAZODONE HCL 50 MG PO TABS: 25.0000 mg | ORAL_TABLET | Freq: Every evening | ORAL | 5 refills | Status: DC | PRN

## 2018-11-08 NOTE — Assessment & Plan Note (Addendum)
S: not sleeping well at night. Falls asleep relatively easily around 12 or 12 30 wakes up 1-2 hours later wide awake. Goes down and watches tv until he falls asleep. Takes 2 nighttime aleve and melatonin 20mg .  A/P: sleep maintenance insomnia- will trial trazodone after trying avoiding tv an hour before bed or with waking up. Also generally advise avoiding daytime naps.   Also discussed possible amitriptyline to help with sleep and neuropathy if trazodone not helpful.

## 2018-11-08 NOTE — Assessment & Plan Note (Signed)
S: Mild poorly controlled on metformin 500 mg daily last visit- we increased to BID.  Neuropathy far predates diabetes. Exercise and diet-last visit had lost 25 pounds-has been able to keep his weight down for most part  Lab Results  Component Value Date   HGBA1C 7.3 (H) 02/12/2018   HGBA1C 6.6 (H) 03/08/2017   HGBA1C 6.0 06/15/2016   A/P: hopefully stable/improved- will update a1c

## 2018-11-08 NOTE — Assessment & Plan Note (Signed)
Referred patient to cardiology last visit for some shortness of breath and mild chest pain with exertion-he ultimately had a catheterization but fortunately only had nonobstructive atherosclerosis of coronary arteries.  Plan for yearly visits

## 2018-11-08 NOTE — Patient Instructions (Addendum)
Health Maintenance Due  Topic Date Due  . TETANUS/TDAP - can get at your pharmacy- we can give it here if you get a cut/scrape/bad insect bite 05/26/2018  . INFLUENZA VACCINE - thanks for doing this today!  07/25/2018   sleep maintenance insomnia- will trial trazodone after trying avoiding tv an hour before bed or with waking up. Also generally advise avoiding daytime naps.   Please stop by lab before you go

## 2018-11-08 NOTE — Progress Notes (Signed)
Subjective:  William Brewer is a 72 y.o. year old very pleasant male patient who presents for/with See problem oriented charting ROS- No chest pain or shortness of breath. No headache or blurry vision. Neuropathy noted.    Past Medical History-  Patient Active Problem List   Diagnosis Date Noted  . Diabetes mellitus type II, controlled ([place]) 09/23/2013    Priority: High  . Idiopathic peripheral neuropathy 01/27/2009    Priority: High  . Insomnia 11/08/2018    Priority: Medium  . Right groin hernia 10/24/2016    Priority: Medium  . Essential hypertension 03/26/2008    Priority: Medium  . Hyperlipidemia 06/21/2007    Priority: Medium  . Basal cell carcinoma of skin 10/28/2015    Priority: Low  . Fever blister 08/10/2015    Priority: Low  . History of colonic polyps 06/21/2007    Priority: Low  . CAD (coronary artery disease) 11/08/2018  . Abnormal stress test   . Chest pain 03/12/2018    Medications- reviewed and updated Current Outpatient Medications  Medication Sig Dispense Refill  . aspirin 81 MG tablet Take 81 mg by mouth daily.      Marland Kitchen ibuprofen (ADVIL,MOTRIN) 200 MG tablet Take 200 mg by mouth every 6 (six) hours as needed.    Marland Kitchen lisinopril-hydrochlorothiazide (PRINZIDE,ZESTORETIC) 20-12.5 MG tablet TAKE 1 TABLET BY MOUTH ONCE DAILY 90 tablet 3  . lovastatin (MEVACOR) 40 MG tablet Take 0.5 tablets (20 mg total) by mouth at bedtime.    Marland Kitchen Lysine HCl 1000 MG TABS Take 1,000 mg by mouth daily.     . Magnesium 400 MG TABS Take 400 mg by mouth daily.     . Melatonin 10 MG CAPS Take 10 mg by mouth at bedtime as needed (for sleep).     . metFORMIN (GLUCOPHAGE) 500 MG tablet Take 1 tablet (500 mg total) by mouth 2 (two) times daily with a meal. 180 tablet 1  . Multiple Vitamins-Minerals (AIRBORNE GUMMIES PO) Take 2 each by mouth daily.     . Multiple Vitamins-Minerals (ONE-A-DAY MENS 50+ ADVANTAGE PO) Take 1 tablet by mouth daily.    . NON FORMULARY Take 1 tablet by mouth at  bedtime. Legatrin PM    . valACYclovir (VALTREX) 1000 MG tablet TAKE TWO TABLETS BY MOUTH TWICE DAILY FOR 1 DAY AS NEEDED (Patient taking differently: Take 1,000-2,000 mg by mouth daily as needed (for outbreak). ) 20 tablet 3  . nitroGLYCERIN (NITROSTAT) 0.4 MG SL tablet Place 1 tablet (0.4 mg total) under the tongue every 5 (five) minutes as needed for chest pain. 25 tablet 3  . traZODone (DESYREL) 50 MG tablet Take 0.5-1 tablets (25-50 mg total) by mouth at bedtime as needed for sleep. 30 tablet 5   No current facility-administered medications for this visit.     Objective: BP 122/74   Pulse 85   Resp 16   Wt 196 lb (88.9 kg)   SpO2 98%   BMI 26.58 kg/m  Gen: NAD, resting comfortably CV: RRR no murmurs rubs or gallops Lungs: CTAB no crackles, wheeze, rhonchi Abdomen: soft/nontender/nondistended/normal bowel sounds. No rebound or guarding.  Ext: no edema Skin: warm, dry Neuro: grossly normal, moves all extremities  Diabetic Foot Exam - Simple   Simple Foot Form Diabetic Foot exam was performed with the following findings:  Yes 11/08/2018  9:23 AM  Visual Inspection No deformities, no ulcerations, no other skin breakdown bilaterally:  Yes Sensation Testing See comments:  Yes Pulse Check Posterior Tibialis  and Dorsalis pulse intact bilaterally:  Yes Comments Cannot feel monofilament until at his knee level    Assessment/Plan:  Other notes: 1.Started fiber supplement and doing prune juice- getting more loose stools. He is getting slightly looser stools- may drop prune juice 2. Intermittent left buttocks cramping- states no pain- does think perhaps more common with lovastatin increase 3. Living with about 5-6/10 pain- if gets above this wants to consider gabapentin. Paresthesias and stinging pain in hands at low level/stabbing pains intermittent- getting worse 4. Walks or stationary bike 4 days a week  Insomnia S: not sleeping well at night. Falls asleep relatively easily  around 12 or 12 30 wakes up 1-2 hours later wide awake. Goes down and watches tv until he falls asleep. Takes 2 nighttime aleve and melatonin 20mg .  A/P: sleep maintenance insomnia- will trial trazodone after trying avoiding tv an hour before bed or with waking up. Also generally advise avoiding daytime naps.   Also discussed possible amitriptyline to help with sleep and neuropathy if trazodone not helpful.   Hyperlipidemia S: Reasonably well controlled on lovastatin 20mg  twice a week- he has had myalgias with higher doses-fortunately doing okay on this dose.  Last LDL was 91 in February. Cardiology advised 20mg  daily- he is doing lovastatin 40mg  every other day.   Cardiology has recommended LDL goal of 70 or less low.  A/P: Suspect stable- update full lipid panel and CMP  Essential hypertension S: controlled on lisinopril chlorothiazide 20-12.5 mg BP Readings from Last 3 Encounters:  11/08/18 122/74  05/23/18 120/80  03/26/18 103/67  A/P: We discussed blood pressure goal of <140/90. Continue current meds  Diabetes mellitus type II, controlled ([place]) S: Mild poorly controlled on metformin 500 mg daily last visit- we increased to BID.  Neuropathy far predates diabetes. Exercise and diet-last visit had lost 25 pounds-has been able to keep his weight down for most part  Lab Results  Component Value Date   HGBA1C 7.3 (H) 02/12/2018   HGBA1C 6.6 (H) 03/08/2017   HGBA1C 6.0 06/15/2016   A/P: hopefully stable/improved- will update a1c  CAD (coronary artery disease) Referred patient to cardiology last visit for some shortness of breath and mild chest pain with exertion-he ultimately had a catheterization but fortunately only had nonobstructive atherosclerosis of coronary arteries.  Plan for yearly visits   Future Appointments  Date Time Provider [place]  12/12/2018 10:00 AM [facility]   6 months if a1c at goal, 4 months otherwise  Lab/Order  associations: Need for influenza vaccination - Plan: Flu vaccine HIGH DOSE PF (Fluzone High Dose)  Primary insomnia  Hyperlipidemia, unspecified hyperlipidemia type  Essential hypertension  Controlled type 2 diabetes mellitus without complication, without long-term current use of insulin ([place]) - Plan: Comprehensive metabolic panel, Lipid panel, Hemoglobin A1c  Coronary artery disease involving native coronary artery of native heart without angina pectoris  Meds ordered this encounter  Medications  . traZODone (DESYREL) 50 MG tablet    Sig: Take 0.5-1 tablets (25-50 mg total) by mouth at bedtime as needed for sleep.    Dispense:  30 tablet    Refill:  5   Return precautions advised.  Garret Reddish, MD

## 2018-11-08 NOTE — Assessment & Plan Note (Signed)
S: controlled on lisinopril chlorothiazide 20-12.5 mg BP Readings from Last 3 Encounters:  11/08/18 122/74  05/23/18 120/80  03/26/18 103/67  A/P: We discussed blood pressure goal of <140/90. Continue current meds

## 2018-11-08 NOTE — Assessment & Plan Note (Signed)
S: Reasonably well controlled on lovastatin 20mg  twice a week- he has had myalgias with higher doses-fortunately doing okay on this dose.  Last LDL was 91 in February. Cardiology advised 20mg  daily- he is doing lovastatin 40mg  every other day.   Cardiology has recommended LDL goal of 70 or less low.  A/P: Suspect stable- update full lipid panel and CMP

## 2018-12-12 ENCOUNTER — Ambulatory Visit: Payer: Medicare HMO

## 2019-01-08 ENCOUNTER — Ambulatory Visit (INDEPENDENT_AMBULATORY_CARE_PROVIDER_SITE_OTHER): Payer: Medicare HMO

## 2019-01-08 VITALS — BP 130/82 | HR 73 | Temp 97.7°F | Ht 72.0 in | Wt 197.8 lb

## 2019-01-08 DIAGNOSIS — Z0000 Encounter for general adult medical examination without abnormal findings: Secondary | ICD-10-CM | POA: Diagnosis not present

## 2019-01-08 NOTE — Progress Notes (Signed)
Subjective:   William Brewer is a 73 y.o. male who presents for Medicare Annual/Subsequent preventive examination.  Review of Systems:  No ROS.  Medicare Wellness Visit. Additional risk factors are reflected in the social history. Cardiac Risk Factors include: advanced age (>59men, >21 women);male gender  Patient lives with wife and they have a dog Gracie. They have a 2 story home. They have 2 children. Their daughter is a breast cancer survivor. Patient plays [employer], enjoys Painting, participates in a [employer] Peripheral Neuropathy support group, enjoys spending time with grandkids, and is on a committee to plan college reunion.  Patient goes to bed around 11:30-11:45. No CPAP. Patient sleeps through the night most nights, may occasionally get up to go to the bathroom. Patient gets up around  Objective:    Vitals: BP 130/82 (BP Location: Left Arm, Patient Position: Sitting, Cuff Size: Large)   Pulse 73   Temp 97.7 F (36.5 C) (Oral)   Ht 6' (1.829 m)   Wt 197 lb 12.8 oz (89.7 kg)   SpO2 97%   BMI 26.83 kg/m   Body mass index is 26.83 kg/m.  Advanced Directives 01/08/2019 03/26/2018 03/15/2017  Does Patient Have a Medical Advance Directive? No No Yes  Would patient like information on creating a medical advance directive? Yes (MAU/Ambulatory/Procedural Areas - Information given) No - Patient declined -   Per the physicians order, and with the patient's consent approximately 28 minutes was spent discussing [place] and Living Will. Patient provided packet. All questions answered and patient verbalized understanding  Tobacco Social History   Tobacco Use  Smoking Status Never Smoker  Smokeless Tobacco Never Used     Counseling given: Not Answered    Past Medical History:  Diagnosis Date  . Allergy   . Basal cell carcinoma    leg  . Colon polyps   . Diabetes mellitus without complication ([place])   .  DIVERTICULOSIS, COLON 05/26/2008  . Hyperlipidemia    refused treatment  . Hypertension   . Peripheral neuropathy    Past Surgical History:  Procedure Laterality Date  . APPENDECTOMY    . COLONOSCOPY    . LEFT HEART CATH AND CORONARY ANGIOGRAPHY N/A 03/26/2018   Procedure: LEFT HEART CATH AND CORONARY ANGIOGRAPHY;  Surgeon: Jettie Booze, MD;  Location: [facility];  Service: Cardiovascular;  Laterality: N/A;  . POLYPECTOMY    . ULTRASOUND GUIDANCE FOR VASCULAR ACCESS  03/26/2018   Procedure: Ultrasound Guidance For Vascular Access;  Surgeon: Jettie Booze, MD;  Location: [facility];  Service: Cardiovascular;;   Family History  Adopted: Yes  Problem Relation Age of Onset  . Deep vein thrombosis Mother        cancer  . Cancer Mother        intestine  . Heart disease Father 84       CABG, former smoker but not heavy  . Breast cancer Daughter   . Alzheimer's disease Maternal Grandmother   . Aortic aneurysm Maternal Grandfather   . Hypertension Neg Hx   . Colon cancer Neg Hx    Social History   Socioeconomic History  . Marital status: Married    Spouse name: Not on file  . Number of children: Not on file  . Years of education: Not on file  . Highest education level: Not on file  Occupational History  . Not on file  Social Needs  .  Financial resource strain: Not on file  . Food insecurity:    Worry: Not on file    Inability: Not on file  . Transportation needs:    Medical: Not on file    Non-medical: Not on file  Tobacco Use  . Smoking status: Never Smoker  . Smokeless tobacco: Never Used  Substance and Sexual Activity  . Alcohol use: Yes    Alcohol/week: 5.0 - 6.0 standard drinks    Types: 3 Cans of beer, 2 - 3 Standard drinks or equivalent per week    Comment: 3 a week   . Drug use: No  . Sexual activity: Yes  Lifestyle  . Physical activity:    Days per week: Not on file    Minutes per session: Not on file  . Stress: Not on file    Relationships  . Social connections:    Talks on phone: Not on file    Gets together: Not on file    Attends religious service: Not on file    Active member of club or organization: Not on file    Attends meetings of clubs or organizations: Not on file    Relationship status: Not on file  Other Topics Concern  . Not on file  Social History Narrative   Married 1973. 2 children. 2 grandkids. Son in [place]. Daughter 5 minutes away.       Retired Worked form Event organiser      Hobbies: support group for peripheral neuropathy, chess, words with friends, active with [employer] college (board of advisors)    Outpatient Encounter Medications as of 01/08/2019  Medication Sig  . aspirin 81 MG tablet Take 81 mg by mouth daily.    Marland Kitchen ibuprofen (ADVIL,MOTRIN) 200 MG tablet Take 200 mg by mouth every 6 (six) hours as needed.  Marland Kitchen lisinopril-hydrochlorothiazide (PRINZIDE,ZESTORETIC) 20-12.5 MG tablet TAKE 1 TABLET BY MOUTH ONCE DAILY  . lovastatin (MEVACOR) 40 MG tablet Take 0.5 tablets (20 mg total) by mouth at bedtime.  Marland Kitchen Lysine HCl 1000 MG TABS Take 1,000 mg by mouth daily.   . Magnesium 400 MG TABS Take 400 mg by mouth daily.   . Melatonin 10 MG CAPS Take 10 mg by mouth at bedtime as needed (for sleep).   . metFORMIN (GLUCOPHAGE) 500 MG tablet Take 1 tablet (500 mg total) by mouth 2 (two) times daily with a meal.  . Multiple Vitamins-Minerals (AIRBORNE GUMMIES PO) Take 2 each by mouth daily.   . Multiple Vitamins-Minerals (ONE-A-DAY MENS 50+ ADVANTAGE PO) Take 1 tablet by mouth daily.  . NON FORMULARY Take 1 tablet by mouth at bedtime. Legatrin PM  . traZODone (DESYREL) 50 MG tablet Take 0.5-1 tablets (25-50 mg total) by mouth at bedtime as needed for sleep.  . valACYclovir (VALTREX) 1000 MG tablet TAKE TWO TABLETS BY MOUTH TWICE DAILY FOR 1 DAY AS NEEDED (Patient taking differently: Take 1,000-2,000 mg by mouth daily as needed (for outbreak). )  .  nitroGLYCERIN (NITROSTAT) 0.4 MG SL tablet Place 1 tablet (0.4 mg total) under the tongue every 5 (five) minutes as needed for chest pain.   No facility-administered encounter medications on file as of 01/08/2019.     Activities of Daily Living In your present state of health, do you have any difficulty performing the following activities: 01/08/2019  Hearing? N  Vision? N  Difficulty concentrating or making decisions? N  Walking or climbing stairs? N  Comment Has peripheral Neuropathy so he has to be cautious  Dressing or bathing? N  Doing errands, shopping? N  Preparing Food and eating ? N  Using the Toilet? N  In the past six months, have you accidently leaked urine? N  Do you have problems with loss of bowel control? N  Managing your Medications? N  Managing your Finances? N  Housekeeping or managing your Housekeeping? N  Some recent data might be hidden    Patient Care Team: Marin Olp, MD as PCP - General (Family Medicine) Dorothy Spark, MD as PCP - Cardiology (Cardiology)   Assessment:   This is a routine wellness examination for William Brewer.  Exercise Activities and Dietary recommendations Current Exercise Habits: Structured exercise class, Time (Minutes): 60, Frequency (Times/Week): 1, Weekly Exercise (Minutes/Week): 60, Intensity: Moderate, Exercise limited by: neurologic condition(s);cardiac condition(s)   Breakfast: oatmeal, cereal, sausage biscuits, drinks coffee (black)  Lunch: Soup and sandwich, diet soda  Dinner: casserole, hamburger, eats out on the weekends, wife enjoys cooking, normally drinks water or unsweet tea  2 beers a week Goals    . patient     Maintain your health!       Fall Risk Fall Risk  01/08/2019 03/15/2017 03/15/2017 04/29/2015 09/23/2013  Falls in the past year? 0 No No No No    Depression Screen PHQ 2/9 Scores 01/08/2019 11/08/2018 03/15/2017 03/15/2017  PHQ - 2 Score 0 0 0 0    Cognitive Function MMSE - Mini Mental State Exam  01/08/2019  Orientation to time 5  Orientation to Place 5  Registration 3  Attention/ Calculation 5  Recall 3  Language- name 2 objects 2  Language- repeat 1  Language- follow 3 step command 3  Language- read & follow direction 1  Write a sentence 1  Copy design 1  Total score 30     6CIT Screen 03/15/2017  What Year? (No Data)    Immunization History  Administered Date(s) Administered  . Influenza, High Dose Seasonal PF 10/24/2016, 02/12/2018, 11/08/2018  . Influenza,inj,Quad PF,6+ Mos 09/23/2013, 11/27/2014, 10/28/2015  . Pneumococcal Conjugate-13 04/29/2015  . Pneumococcal Polysaccharide-23 09/20/2012  . Td 05/26/2008  . Tdap 05/26/2008  . Zoster 09/20/2012      Screening Tests Health Maintenance  Topic Date Due  . TETANUS/TDAP  05/26/2018  . OPHTHALMOLOGY EXAM  02/11/2019  . HEMOGLOBIN A1C  05/09/2019  . FOOT EXAM  11/09/2019  . COLONOSCOPY  08/30/2026  . INFLUENZA VACCINE  Completed  . Hepatitis C Screening  Completed  . PNA vac Low Risk Adult  Completed        Plan:  Follow Up with PCP as Advised  I have personally reviewed and noted the following in the patient's chart:   . Medical and social history . Use of alcohol, tobacco or illicit drugs  . Current medications and supplements . Functional ability and status . Nutritional status . Physical activity . Advanced directives . List of other physicians . Vitals . Screenings to include cognitive, depression, and falls . Referrals and appointments  In addition, I have reviewed and discussed with patient certain preventive protocols, quality metrics, and best practice recommendations. A written personalized care plan for preventive services as well as general preventive health recommendations were provided to patient.     [place], [place]  [id number]/77/8242

## 2019-01-08 NOTE — Patient Instructions (Addendum)
William Brewer , Thank you for taking time to come for your Medicare Wellness Visit. I appreciate your ongoing commitment to your health goals. Please review the following plan we discussed and let me know if I can assist you in the future.   These are the goals we discussed: Goals    . patient     Maintain your health!       This is a list of the screening recommended for you and due dates:  Health Maintenance  Topic Date Due  . Tetanus Vaccine  05/26/2018  . Eye exam for diabetics  02/11/2019  . Hemoglobin A1C  05/09/2019  . Complete foot exam   11/09/2019  . Colon Cancer Screening  08/30/2026  . Flu Shot  Completed  .  Hepatitis C: One time screening is recommended by Center for Disease Control  (CDC) for  adults born from 18 through 1965.   Completed  . Pneumonia vaccines  Completed   Preventive Care for Adults  A healthy lifestyle and preventive care can promote health and wellness. Preventive health guidelines for adults include the following key practices.  . A routine yearly physical is a good way to check with your health care provider about your health and preventive screening. It is a chance to share any concerns and updates on your health and to receive a thorough exam.  . Visit your dentist for a routine exam and preventive care every 6 months. Brush your teeth twice a day and floss once a day. Good oral hygiene prevents tooth decay and gum disease.  . The frequency of eye exams is based on your age, health, family medical history, use  of contact lenses, and other factors. Follow your health care provider's recommendations for frequency of eye exams.  . Eat a healthy diet. Foods like vegetables, fruits, whole grains, low-fat dairy products, and lean protein foods contain the nutrients you need without too many calories. Decrease your intake of foods high in solid fats, added sugars, and salt. Eat the right amount of calories for you. Get information about a proper  diet from your health care provider, if necessary.  . Regular physical exercise is one of the most important things you can do for your health. Most adults should get at least 150 minutes of moderate-intensity exercise (any activity that increases your heart rate and causes you to sweat) each week. In addition, most adults need muscle-strengthening exercises on 2 or more days a week.  Silver Sneakers may be a benefit available to you. To determine eligibility, you may visit the website: www.silversneakers.com or contact program at (934) 817-5095 Mon-Fri between 8AM-8PM.   . Maintain a healthy weight. The body mass index (BMI) is a screening tool to identify possible weight problems. It provides an estimate of body fat based on height and weight. Your health care provider can find your BMI and can help you achieve or maintain a healthy weight.   For adults 20 years and older: ? A BMI below 18.5 is considered underweight. ? A BMI of 18.5 to 24.9 is normal. ? A BMI of 25 to 29.9 is considered overweight. ? A BMI of 30 and above is considered obese.   . Maintain normal blood lipids and cholesterol levels by exercising and minimizing your intake of saturated fat. Eat a balanced diet with plenty of fruit and vegetables. Blood tests for lipids and cholesterol should begin at age 22 and be repeated every 5 years. If your lipid or cholesterol levels  are high, you are over 50, or you are at high risk for heart disease, you may need your cholesterol levels checked more frequently. Ongoing high lipid and cholesterol levels should be treated with medicines if diet and exercise are not working.  . If you smoke, find out from your health care provider how to quit. If you do not use tobacco, please do not start.  . If you choose to drink alcohol, please do not consume more than 2 drinks per day. One drink is considered to be 12 ounces (355 mL) of beer, 5 ounces (148 mL) of wine, or 1.5 ounces (44 mL) of  liquor.  . If you are 73-30 years old, ask your health care provider if you should take aspirin to prevent strokes.  . Use sunscreen. Apply sunscreen liberally and repeatedly throughout the day. You should seek shade when your shadow is shorter than you. Protect yourself by wearing long sleeves, pants, a wide-brimmed hat, and sunglasses year round, whenever you are outdoors.  . Once a month, do a whole body skin exam, using a mirror to look at the skin on your back. Tell your health care provider of new moles, moles that have irregular borders, moles that are larger than a pencil eraser, or moles that have changed in shape or color.

## 2019-01-08 NOTE — Progress Notes (Signed)
PCP notes: Last OV 11/08/2018   Health maintenance: Tetanus-advised to update at the pharmacy and let us know when he received it. He verbalized understanding   Abnormal screenings: Hearing Screening, Passed 3/4 of the tones. Missed 4000Hz  bilaterally. Discussed follow up testing at [facility]  MMSE score of 30   Patient concerns:  Peripheral Neuropathy   Nurse concerns:None, discussed Medical Advanced Directive, packet provided   Next PCP appt: 05/06/2019

## 2019-01-08 NOTE — Progress Notes (Signed)
I have reviewed and agree with note, evaluation, plan.   Stephen Hunter, MD  

## 2019-04-24 ENCOUNTER — Other Ambulatory Visit: Payer: Self-pay

## 2019-04-24 MED ORDER — METFORMIN HCL 500 MG PO TABS: 500.0000 mg | ORAL_TABLET | Freq: Two times a day (BID) | ORAL | 1 refills | Status: DC

## 2019-04-28 ENCOUNTER — Encounter: Payer: Self-pay | Admitting: Family Medicine

## 2019-04-28 ENCOUNTER — Ambulatory Visit (INDEPENDENT_AMBULATORY_CARE_PROVIDER_SITE_OTHER): Payer: Medicare HMO | Admitting: Family Medicine

## 2019-04-28 VITALS — Ht 72.0 in | Wt 197.8 lb

## 2019-04-28 DIAGNOSIS — R69 Illness, unspecified: Secondary | ICD-10-CM | POA: Diagnosis not present

## 2019-04-28 DIAGNOSIS — F5101 Primary insomnia: Secondary | ICD-10-CM

## 2019-04-28 DIAGNOSIS — Z6826 Body mass index (BMI) 26.0-26.9, adult: Secondary | ICD-10-CM | POA: Diagnosis not present

## 2019-04-28 DIAGNOSIS — G609 Hereditary and idiopathic neuropathy, unspecified: Secondary | ICD-10-CM

## 2019-04-28 DIAGNOSIS — E663 Overweight: Secondary | ICD-10-CM

## 2019-04-28 DIAGNOSIS — I2510 Atherosclerotic heart disease of native coronary artery without angina pectoris: Secondary | ICD-10-CM

## 2019-04-28 DIAGNOSIS — E1142 Type 2 diabetes mellitus with diabetic polyneuropathy: Secondary | ICD-10-CM

## 2019-04-28 MED ORDER — SUVOREXANT 10 MG PO TABS: 10.0000 mg | ORAL_TABLET | Freq: Every evening | ORAL | 5 refills | Status: DC | PRN

## 2019-04-28 NOTE — Progress Notes (Signed)
Phone [phone]   Subjective:  Virtual visit via Video note. Chief complaint: Chief Complaint  Patient presents with  . Follow-up    Pt is requesting sleep aides   This visit type was conducted due to national recommendations for restrictions regarding the COVID-19 Pandemic (e.g. social distancing).  This format is felt to be most appropriate for this patient at this time balancing risks to patient and risks to population by having him in for in person visit.  No physical exam was performed (except for noted visual exam or audio findings with Telehealth visits).    Our team/I connected with William Brewer on 04/29/19 at  3:00 PM EDT by a video enabled telemedicine application ([email or web address]) and verified that I am speaking with the correct person using two identifiers.  Location patient: [place] Location provider: [facility], office Persons participating in the virtual visit:  patient  Our team/I discussed the limitations of evaluation and management by telemedicine and the availability of in person appointments. In light of current covid-19 pandemic, patient also understands that we are trying to protect them by minimizing in office contact if at all possible.  The patient expressed consent for telemedicine visit and agreed to proceed. Patient understands insurance will be billed.   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 No fever/chills/cough/ sore throat.  No chest pain or shortness of breath. No headache or blurry vision.   Past Medical History-  Patient Active Problem List   Diagnosis Date Noted  . CAD (coronary artery disease) 11/08/2018    Priority: High  . Diabetes mellitus type II, controlled ([place]) 09/23/2013    Priority: High  . Idiopathic peripheral neuropathy 01/27/2009    Priority: High  . Insomnia 11/08/2018    Priority: Medium  . Right groin hernia 10/24/2016    Priority: Medium  . Essential hypertension 03/26/2008    Priority:  Medium  . Hyperlipidemia 06/21/2007    Priority: Medium  . Basal cell carcinoma of skin 10/28/2015    Priority: Low  . Fever blister 08/10/2015    Priority: Low  . History of colonic polyps 06/21/2007    Priority: Low    Medications- reviewed and updated Current Outpatient Medications  Medication Sig Dispense Refill  . ibuprofen (ADVIL,MOTRIN) 200 MG tablet Take 200 mg by mouth every 6 (six) hours as needed.    Marland Kitchen lisinopril-hydrochlorothiazide (PRINZIDE,ZESTORETIC) 20-12.5 MG tablet TAKE 1 TABLET BY MOUTH ONCE DAILY 90 tablet 3  . lovastatin (MEVACOR) 40 MG tablet Take 0.5 tablets (20 mg total) by mouth at bedtime.    Marland Kitchen Lysine HCl 1000 MG TABS Take 1,000 mg by mouth daily.     . Magnesium 400 MG TABS Take 500 mg by mouth daily.     . metFORMIN (GLUCOPHAGE) 500 MG tablet Take 1 tablet (500 mg total) by mouth 2 (two) times daily with a meal. 180 tablet 1  . Multiple Vitamins-Minerals (AIRBORNE GUMMIES PO) Take 2 each by mouth daily.     . Multiple Vitamins-Minerals (ONE-A-DAY MENS 50+ ADVANTAGE PO) Take 1 tablet by mouth daily.    . nitroGLYCERIN (NITROSTAT) 0.4 MG SL tablet Place 1 tablet (0.4 mg total) under the tongue every 5 (five) minutes as needed for chest pain. 25 tablet 3  . Suvorexant (BELSOMRA) 10 MG TABS Take 10 mg by mouth at bedtime as needed. 30 tablet 5   No current facility-administered medications for this visit.      Objective:  Ht 6' (1.829 m)  Wt 197 lb 12.8 oz (89.7 kg)   BMI 26.83 kg/m  Gen: NAD, resting comfortably Lungs: nonlabored, normal respiratory rate  Skin: appears dry, no obvious rash     Assessment and Plan   # overweight S: he thinks he may have gained up to 205- scared to weight so gave our staff the weight he last had. Has a reununion in the fall so wants to see me in October and plans to get weight back down in the past Wt Readings from Last 3 Encounters:  04/28/19 197 lb 12.8 oz (89.7 kg)  01/08/19 197 lb 12.8 oz (89.7 kg)  11/08/18  196 lb (88.9 kg)  A/P: he would like to drop weight a few lbs- this would likely help with DM control. Encouraged need for healthy eating, regular exercise, weight loss.    # Diabetes -neuropathy S: poorly controlled on last check- he is compliant with metformin 500mg  twice a day. He feels sugars slightly with worse control han previous Lab Results  Component Value Date   HGBA1C 7.7 (H) 11/08/2018   HGBA1C 7.3 (H) 02/12/2018   HGBA1C 6.6 (H) 03/08/2017   A/P: I told patient I would like to get an a1c at this point but he has moderate risk (on high end of this range)  for covid 19 and strongly prefers to push this out a few months- encouraged him to schedule visit no later than 3-4 months - neuropathy predated diabetes but diabetes could certainly contribute. He is dealing with the pain. Ibuprofen sparing with stabbing pains in neuropath - want to be careful with this - need to update bmp or cmp at next visit  # Insomnia S:three or four months seemed to help- then unfortunately seemed to worsen his neuropathy. He got to the point where he took 1.5 tablets.   nyquil severe cold and flu helps- 20 to 30 percent pain drop and is able to sleep with it- sometimes adds an OTC cramping medicine.   Used ambien years ago and didn't help a ton with plane flights  Issue is able to fall asleep but then wakes up within a half an hour or hour and is wide awake.  A/P: trazodone worsened neuropathy- added as intolerance. We will trial belsomra as he is typically able to fall asleep.  -ambien as back up since tends to wake up within first hour- also could consider ambien XR  He will call to schedule a 3-4 month follow up.  Future Appointments  Date Time Provider [place]  05/06/2019  9:20 AM Marin Olp, MD [facility]  01/14/2020  3:00 PM [facility] HEALTH COACH [facility]   No follow-ups on file.  Lab/Order associations: Controlled type 2 diabetes mellitus with diabetic polyneuropathy,  without long-term current use of insulin (HCC)  Idiopathic peripheral neuropathy  Overweight  BMI 26.0-26.9,adult  Primary insomnia  Coronary artery disease involving native coronary artery of native heart without angina pectoris  Meds ordered this encounter  Medications  . Suvorexant (BELSOMRA) 10 MG TABS    Sig: Take 10 mg by mouth at bedtime as needed.    Dispense:  30 tablet    Refill:  5   Return precautions advised.  Garret Reddish, MD

## 2019-04-28 NOTE — Progress Notes (Signed)
I called to pharmacy and verbally gave rx to fill.  I spoke with Edie.

## 2019-04-28 NOTE — Patient Instructions (Addendum)
Health Maintenance Due  Topic Date Due  . TETANUS/TDAP Please stop by your local pharmacy once Covid-19 calms  05/26/2018  . OPHTHALMOLOGY EXAM Please a sign a ROI  02/11/2019    Depression screen William Brewer 2/9 01/08/2019 11/08/2018 03/15/2017  Decreased Interest 0 0 0  Down, Depressed, Hopeless 0 0 0  PHQ - 2 Score 0 0 0   Video visit

## 2019-04-29 MED ORDER — SUVOREXANT 10 MG PO TABS: 10.0000 mg | ORAL_TABLET | Freq: Every evening | ORAL | 5 refills | Status: DC | PRN

## 2019-04-29 NOTE — Assessment & Plan Note (Signed)
S:three or four months seemed to help- then unfortunately seemed to worsen his neuropathy. He got to the point where he took 1.5 tablets.   nyquil severe cold and flu helps- 20 to 30 percent pain drop and is able to sleep with it- sometimes adds an OTC cramping medicine.   Used ambien years ago and didn't help a ton with plane flights  Issue is able to fall asleep but then wakes up within a half an hour or hour and is wide awake.  A/P: trazodone worsened neuropathy- added as intolerance. We will trial belsomra as he is typically able to fall asleep.  -ambien as back up since tends to wake up within first hour- also could consider ambien XR

## 2019-04-30 ENCOUNTER — Telehealth: Payer: Self-pay

## 2019-04-30 NOTE — Telephone Encounter (Signed)
Pt requesting Ambien due to high cost of Belsomra after PA.

## 2019-04-30 NOTE — Telephone Encounter (Signed)
Pt notified of update.  

## 2019-04-30 NOTE — Telephone Encounter (Signed)
Evlyn Kanner Key: [id number] - PA Case ID: [id number] Need help? Call us at [phone]  Status  Additional Information Required  DrugBelsomra 10MG  tablets  FormAetna Medicare Part D ePA Form   SENT

## 2019-04-30 NOTE — Progress Notes (Signed)
I spoke with patient and he agreed to a similar medication due to the high cost of the Rx being 300 to 400.  Patient request Ambien.  Please advise.

## 2019-04-30 NOTE — Telephone Encounter (Signed)
Evlyn Kanner Key: [id number] - PA Case ID: [id number] Need help? Call us at [phone]  Outcome  Approvedtoday  PA Case: [id number], Status: Approved, Coverage Starts on: 12/23/2018, Coverage Ends on: 12/25/2019. Questions? Contact [phone].  DrugBelsomra 10MG  tablets  FormAetna Medicare Part D ePA Form

## 2019-05-01 MED ORDER — ZOLPIDEM TARTRATE 5 MG PO TABS: 5.0000 mg | ORAL_TABLET | Freq: Every evening | ORAL | 5 refills | Status: DC | PRN

## 2019-05-01 NOTE — Telephone Encounter (Signed)
Left a detailed message informing pt of update.

## 2019-05-06 ENCOUNTER — Ambulatory Visit: Payer: Medicare HMO | Admitting: Family Medicine

## 2019-05-08 DIAGNOSIS — L989 Disorder of the skin and subcutaneous tissue, unspecified: Secondary | ICD-10-CM | POA: Diagnosis not present

## 2019-05-08 DIAGNOSIS — R238 Other skin changes: Secondary | ICD-10-CM | POA: Diagnosis not present

## 2019-05-08 DIAGNOSIS — D485 Neoplasm of uncertain behavior of skin: Secondary | ICD-10-CM | POA: Diagnosis not present

## 2019-06-05 ENCOUNTER — Other Ambulatory Visit: Payer: Self-pay

## 2019-06-05 MED ORDER — LOVASTATIN 40 MG PO TABS: 40.0000 mg | ORAL_TABLET | Freq: Every day | ORAL | 1 refills | Status: DC

## 2019-09-15 ENCOUNTER — Ambulatory Visit: Payer: Medicare HMO | Admitting: Family Medicine
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
## 2019-10-08 NOTE — Patient Instructions (Addendum)
Health Maintenance Due  Topic Date Due  . William Brewer -will check with pharmacy  05/26/2018  . OPHTHALMOLOGY EXAM -need to schedule. Please make sure they send [place] a copy after completed.  02/11/2019  . HEMOGLOBIN A1C -today 05/09/2019  . INFLUENZA VACCINE-today  07/26/2019   Team- please have our lab team come by to draw blood- patient needs to lay down   Try 100mg  gabapentin at night along with ambien 2.5mg  for a week and then try off ambien. Can use gabapentin up to 3x a day if you tolerate it- make sure you don't feel too groggy on it.   If a1c is over 7 lets plan on 4 month follow up, if below 7, lets plan on 6 month physical

## 2019-10-16 ENCOUNTER — Encounter: Payer: Self-pay | Admitting: Family Medicine

## 2019-10-16 ENCOUNTER — Ambulatory Visit (INDEPENDENT_AMBULATORY_CARE_PROVIDER_SITE_OTHER): Payer: Medicare HMO | Admitting: Family Medicine

## 2019-10-16 ENCOUNTER — Other Ambulatory Visit: Payer: Self-pay

## 2019-10-16 VITALS — BP 110/70 | HR 96 | Temp 98.2°F | Ht 72.0 in | Wt 191.0 lb

## 2019-10-16 DIAGNOSIS — E785 Hyperlipidemia, unspecified: Secondary | ICD-10-CM | POA: Diagnosis not present

## 2019-10-16 DIAGNOSIS — I2510 Atherosclerotic heart disease of native coronary artery without angina pectoris: Secondary | ICD-10-CM

## 2019-10-16 DIAGNOSIS — Z23 Encounter for immunization: Secondary | ICD-10-CM | POA: Diagnosis not present

## 2019-10-16 DIAGNOSIS — G609 Hereditary and idiopathic neuropathy, unspecified: Secondary | ICD-10-CM | POA: Diagnosis not present

## 2019-10-16 DIAGNOSIS — E1142 Type 2 diabetes mellitus with diabetic polyneuropathy: Secondary | ICD-10-CM | POA: Diagnosis not present

## 2019-10-16 DIAGNOSIS — I10 Essential (primary) hypertension: Secondary | ICD-10-CM | POA: Diagnosis not present

## 2019-10-16 LAB — COMPREHENSIVE METABOLIC PANEL
ALT: 49 U/L (ref 0–53)
AST: 38 U/L — ABNORMAL HIGH (ref 0–37)
Albumin: 4.7 g/dL (ref 3.5–5.2)
Alkaline Phosphatase: 53 U/L (ref 39–117)
BUN: 23 mg/dL (ref 6–23)
CO2: 25 mEq/L (ref 19–32)
Calcium: 9.9 mg/dL (ref 8.4–10.5)
Chloride: 100 mEq/L (ref 96–112)
Creatinine, Ser: 1.07 mg/dL (ref 0.40–1.50)
GFR: 67.73 mL/min (ref >60.00)
Glucose, Bld: 178 mg/dL — ABNORMAL HIGH (ref 70–99)
Potassium: 3.9 mEq/L (ref 3.5–5.1)
Sodium: 138 mEq/L (ref 135–145)
Total Bilirubin: 0.7 mg/dL (ref 0.2–1.2)
Total Protein: 7.3 g/dL (ref 6.0–8.3)

## 2019-10-16 LAB — CBC
HCT: 41.5 % (ref 39.0–52.0)
Hemoglobin: 14.4 g/dL (ref 13.0–17.0)
MCHC: 34.7 g/dL (ref 30.0–36.0)
MCV: 91.8 fl (ref 78.0–100.0)
Platelets: 194 10*3/uL (ref 150.0–400.0)
RBC: 4.52 Mil/uL (ref 4.22–5.81)
RDW: 12.9 % (ref 11.5–15.5)
WBC: 6.3 10*3/uL (ref 4.0–10.5)

## 2019-10-16 LAB — HEMOGLOBIN A1C: Hgb A1c MFr Bld: 8.8 % — ABNORMAL HIGH (ref 4.6–6.5)

## 2019-10-16 LAB — TRIGLYCERIDES: Triglycerides: 277 mg/dL — ABNORMAL HIGH (ref 0.0–149.0)

## 2019-10-16 LAB — LDL CHOLESTEROL, DIRECT: Direct LDL: 81 mg/dL

## 2019-10-16 MED ORDER — GABAPENTIN 100 MG PO CAPS: 100.0000 mg | ORAL_CAPSULE | Freq: Three times a day (TID) | ORAL | 5 refills | Status: DC | PRN
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
## 2019-10-20 DIAGNOSIS — B351 Tinea unguium: Secondary | ICD-10-CM | POA: Diagnosis not present

## 2019-10-20 DIAGNOSIS — D225 Melanocytic nevi of trunk: Secondary | ICD-10-CM | POA: Diagnosis not present

## 2019-10-20 DIAGNOSIS — D485 Neoplasm of uncertain behavior of skin: Secondary | ICD-10-CM | POA: Diagnosis not present

## 2019-10-20 DIAGNOSIS — L603 Nail dystrophy: Secondary | ICD-10-CM | POA: Diagnosis not present

## 2019-10-20 DIAGNOSIS — Z85828 Personal history of other malignant neoplasm of skin: Secondary | ICD-10-CM | POA: Diagnosis not present

## 2019-10-20 DIAGNOSIS — D1801 Hemangioma of skin and subcutaneous tissue: Secondary | ICD-10-CM | POA: Diagnosis not present

## 2019-10-20 DIAGNOSIS — L821 Other seborrheic keratosis: Secondary | ICD-10-CM | POA: Diagnosis not present

## 2019-10-20 DIAGNOSIS — C44719 Basal cell carcinoma of skin of left lower limb, including hip: Secondary | ICD-10-CM | POA: Diagnosis not present

## 2019-11-03 ENCOUNTER — Encounter: Payer: Self-pay | Admitting: Family Medicine

## 2019-11-03 ENCOUNTER — Other Ambulatory Visit: Payer: Self-pay | Admitting: Family Medicine

## 2019-11-03 MED ORDER — METFORMIN HCL 500 MG PO TABS: 500.0000 mg | ORAL_TABLET | Freq: Two times a day (BID) | ORAL | 1 refills | Status: DC

## 2019-11-03 NOTE — Telephone Encounter (Signed)
error:[id number] ° °

## 2019-11-03 NOTE — Telephone Encounter (Signed)
Patient requesting metFORMIN (GLUCOPHAGE) 500 MG tablet, informed please allow 48 to 72 hour turn around time  [place], [place]

## 2019-11-05 ENCOUNTER — Other Ambulatory Visit: Payer: Self-pay | Admitting: Family Medicine

## 2019-11-11 DIAGNOSIS — C44719 Basal cell carcinoma of skin of left lower limb, including hip: Secondary | ICD-10-CM | POA: Diagnosis not present

## 2019-11-27 ENCOUNTER — Other Ambulatory Visit: Payer: Self-pay | Admitting: Family Medicine

## 2020-01-02 ENCOUNTER — Telehealth: Payer: Self-pay | Admitting: Family Medicine

## 2020-01-02 ENCOUNTER — Other Ambulatory Visit: Payer: Self-pay

## 2020-01-02 MED ORDER — GABAPENTIN 100 MG PO CAPS: ORAL_CAPSULE | ORAL | 3 refills | Status: DC

## 2020-01-02 MED ORDER — METFORMIN HCL 1000 MG PO TABS: 1000.0000 mg | ORAL_TABLET | Freq: Two times a day (BID) | ORAL | 3 refills | Status: DC

## 2020-01-02 NOTE — Telephone Encounter (Signed)
Please see my last note on patient-okay to increase to 1000 mg twice a day of Metformin.  You may increase gabapentin to 200 mg 3 times a day for either 30 or 90-day supply.

## 2020-01-02 NOTE — Telephone Encounter (Signed)
Please advise ok to make changes?

## 2020-01-02 NOTE — Telephone Encounter (Signed)
Medication changes made

## 2020-01-02 NOTE — Telephone Encounter (Signed)
Patient called in a stated that Dr. Yong Channel wanted his metFORMIN (GLUCOPHAGE) 500 MG tablet to be 1000mg  now and also he would like to see if his gabapentin (NEURONTIN) 100 MG capsule could be bumped up to 200mg . Patient would like it sent to   [facility], [place] Phone:  [phone]  Fax:  [phone]

## 2020-01-09 ENCOUNTER — Other Ambulatory Visit: Payer: Self-pay

## 2020-01-09 ENCOUNTER — Ambulatory Visit (INDEPENDENT_AMBULATORY_CARE_PROVIDER_SITE_OTHER): Payer: Medicare Other

## 2020-01-09 ENCOUNTER — Ambulatory Visit: Payer: Medicare Other | Admitting: Podiatry

## 2020-01-09 DIAGNOSIS — L97529 Non-pressure chronic ulcer of other part of left foot with unspecified severity: Secondary | ICD-10-CM

## 2020-01-09 DIAGNOSIS — L84 Corns and callosities: Secondary | ICD-10-CM

## 2020-01-09 DIAGNOSIS — G629 Polyneuropathy, unspecified: Secondary | ICD-10-CM | POA: Diagnosis not present

## 2020-01-09 NOTE — Progress Notes (Signed)
Subjective:   Patient ID: William Brewer, male   DOB: 74 y.o.   MRN: [id number]   HPI Patient presents stating that have had irritation on the inside of my left arch and a keratotic lesion and I do have neuropathy and I just wanted to make sure it is okay   ROS      Objective:  Physical Exam  Neurovascular status intact with callus formation inside left arch where there is some prominence of the navicular where there is been some collapse of medial longitudinal arch.  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

## 2020-01-10 IMAGING — NM NM MISC PROCEDURE
6 series · 36 of 36 positions shown · non-contrast
Comparison: none

[Series 1: wbr_s-proj_st stress-sum-em · 6.40mm/px · 6 of 64 frames shown]
[frame 6/64]
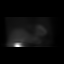
[frame 16/64]
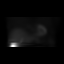
[frame 27/64]
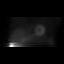
[frame 38/64]
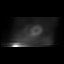
[frame 48/64]
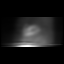
[frame 59/64]
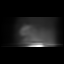

[Series 1: wbr_r-proj_st rest · 6.40mm/px · 6 of 64 frames shown]
[frame 6/64]
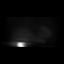
[frame 16/64]
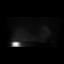
[frame 27/64]
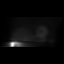
[frame 38/64]
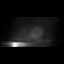
[frame 48/64]
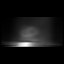
[frame 59/64]
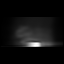

[Series 1: wbr_s-proj_st stress-gsp · 6.40mm/px · 6 of 512 frames shown]
[frame 43/512]
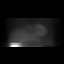
[frame 128/512]
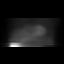
[frame 214/512]
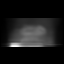
[frame 299/512]
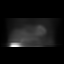
[frame 384/512]
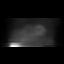
[frame 470/512]
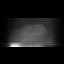

[Series 1: rest · 6.40mm/px · 6 of 64 frames shown]
[frame 6/64]
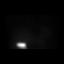
[frame 16/64]
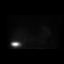
[frame 27/64]
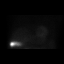
[frame 38/64]
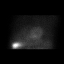
[frame 48/64]
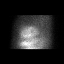
[frame 59/64]
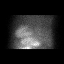

[Series 1: stress-sum-em · 6.40mm/px · 6 of 64 frames shown]
[frame 6/64]
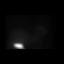
[frame 16/64]
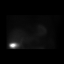
[frame 27/64]
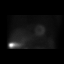
[frame 38/64]
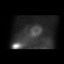
[frame 48/64]
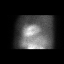
[frame 59/64]
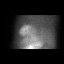

[Series 1: stress-gsp · 6.40mm/px · 6 of 512 frames shown]
[frame 43/512]
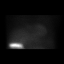
[frame 128/512]
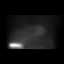
[frame 214/512]
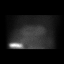
[frame 299/512]
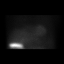
[frame 384/512]
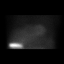
[frame 470/512]
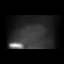

[36 of 36 positions shown; findings below may reference images not displayed]

Canned report from images found in remote index.

Refer to host system for actual result text.

## 2020-01-14 ENCOUNTER — Ambulatory Visit: Payer: Medicare HMO

## 2020-01-15 ENCOUNTER — Ambulatory Visit (INDEPENDENT_AMBULATORY_CARE_PROVIDER_SITE_OTHER): Payer: Medicare Other

## 2020-01-15 ENCOUNTER — Other Ambulatory Visit: Payer: Self-pay

## 2020-01-15 DIAGNOSIS — Z0000 Encounter for general adult medical examination without abnormal findings: Secondary | ICD-10-CM | POA: Diagnosis not present

## 2020-01-15 NOTE — Progress Notes (Signed)
This visit is being conducted via virtual visit due to the COVID-19 pandemic. This patient has given me verbal consent via phone to conduct this visit, patient states they are participating from their home address. Some vital signs may be absent or patient reported.   Patient identification: identified by name, DOB, and current address.  Location provider: [facility], Office Persons participating in the virtual visit: Denman George LPN, patient, and Dr. Garret Reddish     Subjective:   William Brewer is a 74 y.o. male who presents for Medicare Annual/Subsequent preventive examination.  Review of Systems:   Cardiac Risk Factors include: advanced age (>3men, >61 women);diabetes mellitus;dyslipidemia;hypertension;male gender;sedentary lifestyle    Objective:    Vitals: There were no vitals taken for this visit.  There is no height or weight on file to calculate BMI.  Advanced Directives 01/15/2020 01/08/2019 03/26/2018 03/15/2017  Does Patient Have a Medical Advance Directive? No No No Yes  Would patient like information on creating a medical advance directive? Yes (MAU/Ambulatory/Procedural Areas - Information given) Yes (MAU/Ambulatory/Procedural Areas - Information given) No - Patient declined -    Tobacco Social History   Tobacco Use  Smoking Status Never Smoker  Smokeless Tobacco Never Used     Counseling given: Not Answered   Clinical Intake:  Pre-visit preparation completed: Yes  Pain : No/denies pain     Diabetes: Yes(patient states that he does not monitor cbgs .) CBG done?: No Did pt. bring in CBG monitor from home?: No  How often do you need to have someone help you when you read instructions, pamphlets, or other written materials from your doctor or pharmacy?: 1 - Never  Interpreter Needed?: No  Information entered by :: Denman George LPN  Past Medical History:  Diagnosis Date  . Allergy   . Basal cell carcinoma    leg  . Colon polyps   . Diabetes  mellitus without complication ([place])   . DIVERTICULOSIS, COLON 05/26/2008  . Hyperlipidemia    refused treatment  . Hypertension   . Peripheral neuropathy    Past Surgical History:  Procedure Laterality Date  . APPENDECTOMY    . COLONOSCOPY    . LEFT HEART CATH AND CORONARY ANGIOGRAPHY N/A 03/26/2018   Procedure: LEFT HEART CATH AND CORONARY ANGIOGRAPHY;  Surgeon: Jettie Booze, MD;  Location: [facility];  Service: Cardiovascular;  Laterality: N/A;  . POLYPECTOMY    . ULTRASOUND GUIDANCE FOR VASCULAR ACCESS  03/26/2018   Procedure: Ultrasound Guidance For Vascular Access;  Surgeon: Jettie Booze, MD;  Location: [facility];  Service: Cardiovascular;;   Family History  Adopted: Yes  Problem Relation Age of Onset  . Deep vein thrombosis Mother        cancer  . Cancer Mother        intestine  . Heart disease Father 55       CABG, former smoker but not heavy  . Breast cancer Daughter   . Alzheimer's disease Maternal Grandmother   . Aortic aneurysm Maternal Grandfather   . Hypertension Neg Hx   . Colon cancer Neg Hx    Social History   Socioeconomic History  . Marital status: Married    Spouse name: Not on file  . Number of children: Not on file  . Years of education: Not on file  . Highest education level: Not on file  Occupational History  . Not on file  Tobacco Use  . Smoking status: Never Smoker  .  Smokeless tobacco: Never Used  Substance and Sexual Activity  . Alcohol use: Yes    Alcohol/week: 5.0 - 6.0 standard drinks    Types: 3 Cans of beer, 2 - 3 Standard drinks or equivalent per week    Comment: 3 a week   . Drug use: No  . Sexual activity: Yes  Other Topics Concern  . Not on file  Social History Narrative   Married 1973. 2 children. 2 grandkids. Son in [place]. Daughter 5 minutes away.       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 Difficulty of Paying Living Expenses: Not on file  Food Insecurity:   . Worried About Charity fundraiser in the Last Year: Not on file  . Ran Out of Food in the Last Year: Not on file  Transportation Needs:   . Lack of Transportation (Medical): Not on file  . Lack of Transportation (Non-Medical): Not on file  Physical Activity:   . Days of Exercise per Week: Not on file  . Minutes of Exercise per Session: Not on file  Stress:   . Feeling of Stress : Not on file  Social Connections:   . Frequency of Communication with Friends and Family: Not on file  . Frequency of Social Gatherings with Friends and Family: Not on file  . Attends Religious Services: Not on file  . Active Member of Clubs or Organizations: Not on file  . Attends Archivist Meetings: Not on file  . Marital Status: Not on file    Outpatient Encounter Medications as of 01/15/2020  Medication Sig  . aspirin EC 81 MG tablet Take 81 mg by mouth daily.  Marland Kitchen gabapentin (NEURONTIN) 100 MG capsule Take 2 capsules three times a day  . lisinopril-hydrochlorothiazide (ZESTORETIC) 20-12.5 MG tablet Take 1 tablet by mouth once daily  . lovastatin (MEVACOR) 40 MG tablet Take 1 tablet (40 mg total) by mouth at bedtime.  Marland Kitchen Lysine HCl 1000 MG TABS Take 1,000 mg by mouth daily.   . Magnesium 400 MG TABS Take 500 mg by mouth daily.   . metFORMIN (GLUCOPHAGE) 1000 MG tablet Take 1 tablet (1,000 mg total) by mouth 2 (two) times daily with a meal.  . Multiple Vitamins-Minerals (AIRBORNE GUMMIES PO) Take 2 each by mouth daily.   . Multiple Vitamins-Minerals (ONE-A-DAY MENS 50+ ADVANTAGE PO) Take 1 tablet by mouth daily.  Marland Kitchen zolpidem (AMBIEN) 5 MG tablet TAKE 1 TABLET BY MOUTH AT BEDTIME AS NEEDED FOR SLEEP  . [DISCONTINUED] gabapentin (NEURONTIN) 100 MG capsule Take 1 capsule (100 mg total) by mouth 3  (three) times daily as needed.   No facility-administered encounter medications on file as of 01/15/2020.    Activities of Daily Living In your present state of health, do you have any difficulty performing the following activities: 01/15/2020  Hearing? N  Vision? N  Difficulty concentrating or making decisions? N  Walking or climbing stairs? N  Dressing or bathing? N  Doing errands, shopping? N  Preparing Food and eating ? N  Using the Toilet? N  In the past six months, have you accidently leaked urine? N  Do you have problems with loss of bowel control? N  Managing your Medications? N  Managing your Finances? N  Housekeeping or managing  your Housekeeping? N  Some recent data might be hidden    Patient Care Team: Marin Olp, MD as PCP - General (Family Medicine) Dorothy Spark, MD as PCP - Cardiology (Cardiology) Juluis Rainier as Consulting Physician (Optometry) Regal, Tamala Fothergill, DPM as Consulting Physician (Podiatry)   Assessment:   This is a routine wellness examination for [place].  Exercise Activities and Dietary recommendations Current Exercise Habits: The patient does not participate in regular exercise at present  Goals    . patient     Maintain your health!       Fall Risk Fall Risk  01/15/2020 01/08/2019 03/15/2017 03/15/2017 04/29/2015  Falls in the past year? 0 0 No No No  Number falls in past yr: 0 - - - -  Injury with Fall? 0 - - - -  Follow up Falls evaluation completed;Education provided;Falls prevention discussed - - - -   Is the patient's home free of loose throw rugs in walkways, pet beds, electrical cords, etc?   yes      Grab bars in the bathroom? yes      Handrails on the stairs?   yes      Adequate lighting?   yes   Depression Screen PHQ 2/9 Scores 01/15/2020 10/16/2019 01/08/2019 11/08/2018  PHQ - 2 Score 0 1 0 0  PHQ- 9 Score - 1 - -    Cognitive Function MMSE - Mini Mental State Exam 01/08/2019  Orientation to time 5  Orientation to  Place 5  Registration 3  Attention/ Calculation 5  Recall 3  Language- name 2 objects 2  Language- repeat 1  Language- follow 3 step command 3  Language- read & follow direction 1  Write a sentence 1  Copy design 1  Total score 30     6CIT Screen 01/15/2020 03/15/2017  What Year? 0 points (No Data)  What month? 0 points -  What time? 0 points -  Count back from 20 0 points -  Months in reverse 0 points -  Repeat phrase 0 points -  Total Score 0 -    Immunization History  Administered Date(s) Administered  . Fluad Quad(high Dose 65+) 10/16/2019  . Influenza, High Dose Seasonal PF 10/24/2016, 02/12/2018, 11/08/2018  . Influenza,inj,Quad PF,6+ Mos 09/23/2013, 11/27/2014, 10/28/2015  . Pneumococcal Conjugate-13 04/29/2015  . Pneumococcal Polysaccharide-23 09/20/2012  . Td 05/26/2008  . Tdap 05/26/2008  . Zoster 09/20/2012    Qualifies for Shingles Vaccine? Discussed and patient will check with pharmacy for coverage.  Patient education handout provided   Screening Tests Health Maintenance  Topic Date Due  . TETANUS/TDAP  05/26/2018  . OPHTHALMOLOGY EXAM  02/11/2019  . FOOT EXAM  11/09/2019  . HEMOGLOBIN A1C  04/15/2020  . COLONOSCOPY  08/30/2026  . INFLUENZA VACCINE  Completed  . Hepatitis C Screening  Completed  . PNA vac Low Risk Adult  Completed   Cancer Screenings: Lung: Low Dose CT Chest recommended if Age 14-80 years, 30 pack-year currently smoking OR have quit w/in 15years. Patient does not qualify. Colorectal: colonoscopy 08/30/16     Plan:  I have personally reviewed and addressed the Medicare Annual Wellness questionnaire and have noted the following in the patient's chart:  A. Medical and social history B. Use of alcohol, tobacco or illicit drugs  C. Current medications and supplements D. Functional ability and status E.  Nutritional status F.  Physical activity G. Advance directives H. List of other physicians I.  Hospitalizations, surgeries, and  ER  visits in previous 12 months J.  [place] such as hearing and vision if needed, cognitive and depression L. Referrals, records requested, and appointments- patient will self refer for diabetic eye exam   In addition, I have reviewed and discussed with patient certain preventive protocols, quality metrics, and best practice recommendations. A written personalized care plan for preventive services as well as general preventive health recommendations were provided to patient.   Signed,  Denman George, LPN  Nurse Health Advisor   Nurse Notes: no additional

## 2020-01-15 NOTE — Patient Instructions (Signed)
[name] , Thank you for taking time to come for your Medicare Wellness Visit. I appreciate your ongoing commitment to your health goals. Please review the following plan we discussed and let me know if I can assist you in the future.   Screening recommendations/referrals: Colorectal Screening: up to date; last colonoscopy 08/30/16  Vision and Dental Exams: Recommended annual ophthalmology exams for early detection of glaucoma and other disorders of the eye Recommended annual dental exams for proper oral hygiene  Diabetic Exams: Diabetic Eye Exam: recommended yearly Diabetic Foot Exam: recommended yearly; up to date  Vaccinations: Influenza vaccine: completed 10/16/19 Pneumococcal vaccine: up to date; last 04/29/15 Tdap vaccine: recommended; Please call your insurance company to determine your out of pocket expense. You may receive this vaccine at your local pharmacy or Health Dept. Shingles vaccine: Please call your insurance company to determine your out of pocket expense for the Shingrix vaccine. You may receive this vaccine at your local pharmacy. (See attached information )  Advanced directives: Advance directives discussed with you today. I have provided a copy for you to complete at home and have notarized. Once this is complete please bring a copy in to our office so we can scan it into your chart.  Goals: Recommend to drink at least 6-8 8oz glasses of water per day and consume a balanced diet rich in fresh fruits and vegetables.   Next appointment: Please schedule your Annual Wellness Visit with your Nurse Health Advisor in one year.  Preventive Care 74 Years and Older, Male Preventive care refers to lifestyle choices and visits with your health care provider that can promote health and wellness. What does preventive care include?  A yearly physical exam. This is also called an annual well check.  Dental exams once or twice a year.  Routine eye exams. Ask your health care  provider how often you should have your eyes checked.  Personal lifestyle choices, including:  Daily care of your teeth and gums.  Regular physical activity.  Eating a healthy diet.  Avoiding tobacco and drug use.  Limiting alcohol use.  Practicing safe sex.  Taking low doses of aspirin every day if recommended by your health care provider..  Taking vitamin and mineral supplements as recommended by your health care provider. What happens during an annual well check? The services and screenings done by your health care provider during your annual well check will depend on your age, overall health, lifestyle risk factors, and family history of disease. Counseling  Your health care provider may ask you questions about your:  Alcohol use.  Tobacco use.  Drug use.  Emotional well-being.  Home and relationship well-being.  Sexual activity.  Eating habits.  History of falls.  Memory and ability to understand (cognition).  Work and work Statistician. Screening  You may have the following tests or measurements:  Height, weight, and BMI.  Blood pressure.  Lipid and cholesterol levels. These may be checked every 5 years, or more frequently if you are over 50 years old.  Skin check.  Lung cancer screening. You may have this screening every year starting at age 80 if you have a 30-pack-year history of smoking and currently smoke or have quit within the past 15 years.  Fecal occult blood test (FOBT) of the stool. You may have this test every year starting at age 60.  Flexible sigmoidoscopy or colonoscopy. You may have a sigmoidoscopy every 5 years or a colonoscopy every 10 years starting at age 43.  Prostate cancer screening. Recommendations will vary depending on your family history and other risks.  Hepatitis C blood test.  Hepatitis B blood test.  Sexually transmitted disease (STD) testing.  Diabetes screening. This is done by checking your blood sugar  (glucose) after you have not eaten for a while (fasting). You may have this done every 1-3 years.  Abdominal aortic aneurysm (AAA) screening. You may need this if you are a current or former smoker.  Osteoporosis. You may be screened starting at age 70 if you are at high risk. Talk with your health care provider about your test results, treatment options, and if necessary, the need for more tests. Vaccines  Your health care provider may recommend certain vaccines, such as:  Influenza vaccine. This is recommended every year.  Tetanus, diphtheria, and acellular pertussis (Tdap, Td) vaccine. You may need a Td booster every 10 years.  Zoster vaccine. You may need this after age 60.  Pneumococcal 13-valent conjugate (PCV13) vaccine. One dose is recommended after age 70.  Pneumococcal polysaccharide (PPSV23) vaccine. One dose is recommended after age 66. Talk to your health care provider about which screenings and vaccines you need and how often you need them. This information is not intended to replace advice given to you by your health care provider. Make sure you discuss any questions you have with your health care provider. Document Released: 01/07/2016 Document Revised: 08/30/2016 Document Reviewed: 10/12/2015 Elsevier Interactive Patient Education  2017 [employer] Prevention in the Home Falls can cause injuries. They can happen to people of all ages. There are many things you can do to make your home safe and to help prevent falls. What can I do on the outside of my home?  Regularly fix the edges of walkways and driveways and fix any cracks.  Remove anything that might make you trip as you walk through a door, such as a raised step or threshold.  Trim any bushes or trees on the path to your home.  Use bright outdoor lighting.  Clear any walking paths of anything that might make someone trip, such as rocks or tools.  Regularly check to see if handrails are loose or  broken. Make sure that both sides of any steps have handrails.  Any raised decks and porches should have guardrails on the edges.  Have any leaves, snow, or ice cleared regularly.  Use sand or salt on walking paths during winter.  Clean up any spills in your garage right away. This includes oil or grease spills. What can I do in the bathroom?  Use night lights.  Install grab bars by the toilet and in the tub and shower. Do not use towel bars as grab bars.  Use non-skid mats or decals in the tub or shower.  If you need to sit down in the shower, use a plastic, non-slip stool.  Keep the floor dry. Clean up any water that spills on the floor as soon as it happens.  Remove soap buildup in the tub or shower regularly.  Attach bath mats securely with double-sided non-slip rug tape.  Do not have throw rugs and other things on the floor that can make you trip. What can I do in the bedroom?  Use night lights.  Make sure that you have a light by your bed that is easy to reach.  Do not use any sheets or blankets that are too big for your bed. They should not hang down onto the floor.  Have  a firm chair that has side arms. You can use this for support while you get dressed.  Do not have throw rugs and other things on the floor that can make you trip. What can I do in the kitchen?  Clean up any spills right away.  Avoid walking on wet floors.  Keep items that you use a lot in easy-to-reach places.  If you need to reach something above you, use a strong step stool that has a grab bar.  Keep electrical cords out of the way.  Do not use floor polish or wax that makes floors slippery. If you must use wax, use non-skid floor wax.  Do not have throw rugs and other things on the floor that can make you trip. What can I do with my stairs?  Do not leave any items on the stairs.  Make sure that there are handrails on both sides of the stairs and use them. Fix handrails that are  broken or loose. Make sure that handrails are as long as the stairways.  Check any carpeting to make sure that it is firmly attached to the stairs. Fix any carpet that is loose or worn.  Avoid having throw rugs at the top or bottom of the stairs. If you do have throw rugs, attach them to the floor with carpet tape.  Make sure that you have a light switch at the top of the stairs and the bottom of the stairs. If you do not have them, ask someone to add them for you. What else can I do to help prevent falls?  Wear shoes that:  Do not have high heels.  Have rubber bottoms.  Are comfortable and fit you well.  Are closed at the toe. Do not wear sandals.  If you use a stepladder:  Make sure that it is fully opened. Do not climb a closed stepladder.  Make sure that both sides of the stepladder are locked into place.  Ask someone to hold it for you, if possible.  Clearly mark and make sure that you can see:  Any grab bars or handrails.  First and last steps.  Where the edge of each step is.  Use tools that help you move around (mobility aids) if they are needed. These include:  Canes.  Walkers.  Scooters.  Crutches.  Turn on the lights when you go into a dark area. Replace any light bulbs as soon as they burn out.  Set up your furniture so you have a clear path. Avoid moving your furniture around.  If any of your floors are uneven, fix them.  If there are any pets around you, be aware of where they are.  Review your medicines with your doctor. Some medicines can make you feel dizzy. This can increase your chance of falling. Ask your doctor what other things that you can do to help prevent falls. This information is not intended to replace advice given to you by your health care provider. Make sure you discuss any questions you have with your health care provider. Document Released: 10/07/2009 Document Revised: 05/18/2016 Document Reviewed: 01/15/2015 Elsevier  Interactive Patient Education  2017 [employer].

## 2020-01-18 ENCOUNTER — Other Ambulatory Visit: Payer: Self-pay | Admitting: Family Medicine

## 2020-01-20 NOTE — Progress Notes (Signed)
I have reviewed and agree with note, evaluation, plan.   Temeca Somma, MD  

## 2020-01-25 ENCOUNTER — Ambulatory Visit: Payer: Medicare HMO

## 2020-01-30 ENCOUNTER — Ambulatory Visit: Payer: Medicare Other | Attending: Internal Medicine

## 2020-01-30 DIAGNOSIS — Z23 Encounter for immunization: Secondary | ICD-10-CM | POA: Insufficient documentation

## 2020-01-30 NOTE — Progress Notes (Signed)
   Covid-19 Vaccination Clinic  Name:  DSEAN DORAME    MRN: [id number] DOB: 1946-09-09  01/30/2020  Mr. Woodhead was observed post Covid-19 immunization for 15 minutes without incidence. He was provided with Vaccine Information Sheet and instruction to access the V-Safe system.   Mr. Bronikowski was instructed to call 911 with any severe reactions post vaccine: Marland Kitchen Difficulty breathing  . Swelling of your face and throat  . A fast heartbeat  . A bad rash all over your body  . Dizziness and weakness    Immunizations Administered    Name Date Dose VIS Date Route   Pfizer COVID-19 Vaccine 01/30/2020 11:53 AM 0.3 mL 12/05/2019 Intramuscular   Manufacturer: [place]   Lot: [id number]   [place]: [id number]

## 2020-02-02 ENCOUNTER — Other Ambulatory Visit: Payer: Self-pay | Admitting: Family Medicine

## 2020-02-05 ENCOUNTER — Ambulatory Visit: Payer: Medicare HMO

## 2020-02-24 ENCOUNTER — Ambulatory Visit: Payer: Medicare Other | Attending: Internal Medicine

## 2020-02-24 DIAGNOSIS — Z23 Encounter for immunization: Secondary | ICD-10-CM | POA: Insufficient documentation

## 2020-02-24 NOTE — Progress Notes (Signed)
   Covid-19 Vaccination Clinic  Name:  William Brewer    MRN: [id number] DOB: [birth year]  02/24/2020  William Brewer was observed post Covid-19 immunization for 15 minutes without incident. He was provided with Vaccine Information Sheet and instruction to access the V-Safe system.   William Brewer was instructed to call 911 with any severe reactions post vaccine: Marland Kitchen Difficulty breathing  . Swelling of face and throat  . A fast heartbeat  . A bad rash all over body  . Dizziness and weakness   Immunizations Administered    Name Date Dose VIS Date Route   Pfizer COVID-19 Vaccine 02/24/2020 10:53 AM 0.3 mL 12/05/2019 Intramuscular   Manufacturer: [place]   Lot: [id number]   [place]: [id number]

## 2020-03-02 ENCOUNTER — Other Ambulatory Visit: Payer: Self-pay

## 2020-03-02 ENCOUNTER — Ambulatory Visit (INDEPENDENT_AMBULATORY_CARE_PROVIDER_SITE_OTHER): Payer: Medicare Other | Admitting: Family Medicine

## 2020-03-02 ENCOUNTER — Encounter: Payer: Self-pay | Admitting: Family Medicine

## 2020-03-02 VITALS — BP 110/60 | HR 71 | Temp 98.3°F | Ht 72.0 in | Wt 189.6 lb

## 2020-03-02 DIAGNOSIS — G609 Hereditary and idiopathic neuropathy, unspecified: Secondary | ICD-10-CM | POA: Diagnosis not present

## 2020-03-02 DIAGNOSIS — I2510 Atherosclerotic heart disease of native coronary artery without angina pectoris: Secondary | ICD-10-CM | POA: Diagnosis not present

## 2020-03-02 DIAGNOSIS — I10 Essential (primary) hypertension: Secondary | ICD-10-CM | POA: Diagnosis not present

## 2020-03-02 DIAGNOSIS — E785 Hyperlipidemia, unspecified: Secondary | ICD-10-CM

## 2020-03-02 DIAGNOSIS — E1142 Type 2 diabetes mellitus with diabetic polyneuropathy: Secondary | ICD-10-CM | POA: Diagnosis not present

## 2020-03-02 LAB — COMPREHENSIVE METABOLIC PANEL
ALT: 41 U/L (ref 0–53)
AST: 35 U/L (ref 0–37)
Albumin: 4.4 g/dL (ref 3.5–5.2)
Alkaline Phosphatase: 48 U/L (ref 39–117)
BUN: 21 mg/dL (ref 6–23)
CO2: 28 mEq/L (ref 19–32)
Calcium: 9.6 mg/dL (ref 8.4–10.5)
Chloride: 100 mEq/L (ref 96–112)
Creatinine, Ser: 0.97 mg/dL (ref 0.40–1.50)
GFR: 75.77 mL/min (ref >60.00)
Glucose, Bld: 172 mg/dL — ABNORMAL HIGH (ref 70–99)
Potassium: 3.8 mEq/L (ref 3.5–5.1)
Sodium: 138 mEq/L (ref 135–145)
Total Bilirubin: 0.9 mg/dL (ref 0.2–1.2)
Total Protein: 7 g/dL (ref 6.0–8.3)

## 2020-03-02 LAB — LIPID PANEL
Cholesterol: 125 mg/dL (ref 0–200)
HDL: 32.6 mg/dL — ABNORMAL LOW (ref >39.00)
LDL Cholesterol: 59 mg/dL (ref 0–99)
NonHDL: 92.05
Total CHOL/HDL Ratio: 4
Triglycerides: 163 mg/dL — ABNORMAL HIGH (ref 0.0–149.0)
VLDL: 32.6 mg/dL (ref 0.0–40.0)

## 2020-03-02 LAB — HEMOGLOBIN A1C: Hgb A1c MFr Bld: 8.5 % — ABNORMAL HIGH (ref 4.6–6.5)

## 2020-03-02 MED ORDER — GABAPENTIN 300 MG PO CAPS: ORAL_CAPSULE | ORAL | 3 refills | Status: DC

## 2020-03-02 NOTE — Progress Notes (Signed)
Phone [phone] In person visit   Subjective:   William Brewer is a 74 y.o. year old very pleasant male patient who presents for/with See problem oriented charting Chief Complaint  Patient presents with  . Follow-up  . Diabetes   This visit occurred during the SARS-CoV-2 public health emergency.  Safety protocols were in place, including screening questions prior to the visit, additional usage of staff PPE, and extensive cleaning of exam room while observing appropriate contact time as indicated for disinfecting solutions.   Past Medical History-  Patient Active Problem List   Diagnosis Date Noted  . CAD (coronary artery disease) 11/08/2018    Priority: High  . Diabetes mellitus type II, controlled ([place]) 09/23/2013    Priority: High  . Idiopathic peripheral neuropathy 01/27/2009    Priority: High  . Insomnia 11/08/2018    Priority: Medium  . Right groin hernia 10/24/2016    Priority: Medium  . Essential hypertension 03/26/2008    Priority: Medium  . Hyperlipidemia 06/21/2007    Priority: Medium  . Basal cell carcinoma of skin 10/28/2015    Priority: Low  . Fever blister 08/10/2015    Priority: Low  . History of colonic polyps 06/21/2007    Priority: Low    Medications- reviewed and updated Current Outpatient Medications  Medication Sig Dispense Refill  . aspirin EC 81 MG tablet Take by mouth daily.    Marland Kitchen gabapentin (NEURONTIN) 300 MG capsule Take 1 capsule three times a day 270 capsule 3  . lisinopril-hydrochlorothiazide (ZESTORETIC) 20-12.5 MG tablet Take 1 tablet by mouth once daily 90 tablet 0  . lovastatin (MEVACOR) 40 MG tablet TAKE 1 TABLET BY MOUTH AT BEDTIME 90 tablet 0  . Lysine HCl 1000 MG TABS Take 1,000 mg by mouth daily.     . Magnesium 400 MG TABS Take 500 mg by mouth daily.     . metFORMIN (GLUCOPHAGE) 1000 MG tablet Take 1 tablet (1,000 mg total) by mouth 2 (two) times daily with a meal. 180 tablet 3  . zolpidem (AMBIEN) 5 MG tablet TAKE 1 TABLET  BY MOUTH AT BEDTIME AS NEEDED FOR SLEEP 30 tablet 5   No current facility-administered medications for this visit.     Objective:  BP 110/60   Pulse 71   Temp 98.3 F (36.8 C)   Ht 6' (1.829 m)   Wt 189 lb 9.6 oz (86 kg)   SpO2 96%   BMI 25.71 kg/m  Gen: NAD, resting comfortably CV: RRR no murmurs rubs or gallops Lungs: CTAB no crackles, wheeze, rhonchi Ext: no edema Skin: warm, dry    Diabetic Foot Exam - Simple   Simple Foot Form Diabetic Foot exam was performed with the following findings: Yes 03/02/2020  8:47 AM  Visual Inspection No deformities, no ulcerations, no other skin breakdown bilaterally: Yes Sensation Testing Intact to touch and monofilament testing bilaterally: Yes Pulse Check Posterior Tibialis and Dorsalis pulse intact bilaterally: Yes Comments Onychomycosis        Assessment and Plan   # social update- lost son sept 46, 58- Seeing grief counselor but spacing out.   # Diabetes S: Compliant with Metformin 500mg  BID to now 1000 mg twice a day CBGs- not checking blood sugars at home (does not want to) Exercise and diet- pt is eating at home and walks for exercise. Weight down 2 lbs.  Lab Results  Component Value Date   HGBA1C 8.8 (H) 10/16/2019   HGBA1C 7.7 (H) 11/08/2018   HGBA1C  7.3 (H) 02/12/2018   A/P: Significant other worsening of A1c at last visit-we increased Metformin to 1000 mg twice a day.  We also discussed possibly starting Jardiance 10 mg if A1c remains high above 7.  - has some diarrhea but more often constipation  #hypertension S: compliant with lisinopril hydrochlorothiazide 20-12.5 mg BP Readings from Last 3 Encounters:  03/02/20 110/60  10/16/19 110/70  01/08/19 130/82  A/P: Stable. Continue current medications.    #hyperlipidemia/nonobstructive CAD. S: compliant with lovastatin 40 mg.  Patient also has nonobstructive CAD history without chest pain or shortness of breath  Lab Results  Component Value Date   CHOL 162  11/08/2018   HDL 30.20 (L) 11/08/2018   LDLCALC 118 (H) 06/15/2016   LDLDIRECT 81.0 10/16/2019   TRIG 277.0 (H) 10/16/2019   CHOLHDL 5 11/08/2018   A/P: We discussed targeting LDL under 70-last visit was closer at 81-update full lipid panel today-could consider stronger rx such as rosuvastatin 5 mg daily.   For nonobstructive CAD-recommended patient take aspirin 81 mg daily   #Neuropathy S: Patient is now on gabapentin 200 mg up to 3 times a day. Gabapentin has helped him some with pain  He reports walking helps some and so he has been active with that.  A/P: poor control- will try 300mg  3x a day. He also asks about horizant which is a longer acting version where we could use 600mg  twice a day- we agreed if not improved by follow up to try this.    #Insomnia S: We tried Belsomra but was too expensive.  Patient is on Ambien 5 mg- working well. No daytime issues or vivid dreams  A/P: patient doing really well with this- continue current medicine  - encouraged could try every other day or space out some  Recommended follow up: Return in about 4 months (around 07/02/2020).  Lab/Order associations:   ICD-10-CM   1. Controlled type 2 diabetes mellitus with diabetic polyneuropathy, without long-term current use of insulin (HCC)  E11.42 Lipid panel    Comprehensive metabolic panel    Hemoglobin A1c  2. Coronary artery disease involving native coronary artery of native heart without angina pectoris  I25.10   3. Idiopathic peripheral neuropathy  G60.9   4. Hyperlipidemia, unspecified hyperlipidemia type  E78.5   5. Essential hypertension  I10     Meds ordered this encounter  Medications  . gabapentin (NEURONTIN) 300 MG capsule    Sig: Take 1 capsule three times a day    Dispense:  270 capsule    Refill:  3   Time Spent: 31 minutes of total time (8:45 AM- 9:16 AM) was spent on the date of the encounter performing the following actions: chart review prior to seeing the patient, obtaining  history, performing a medically necessary exam, counseling on the treatment plan, placing orders, and documenting in our EHR.   Return precautions advised.  Garret Reddish, MD

## 2020-03-02 NOTE — Patient Instructions (Addendum)
Health Maintenance Due  Topic Date Due  . TETANUS/TDAP -01/17/20 05/26/2018  . OPHTHALMOLOGY EXAM - scheduled.  Please have them send [place] a copy 02/11/2019   Strongly considering jardiance 10 mg if a1c over 7- would need to make sure to remain well hydrated on this.   Also may need to increase cholesterol medicine considering history of nonobstructive coronary artery disease. Plant based nutrition can also lower cholesterol if you wanted to go heavier towards that- nutritionfacts.org is a good resource  Please stop by lab before you go If you do not have mychart- we will call you about results within 5 business days of [place] receiving them.  If you have mychart- we will send your results within 3 business days of [place] receiving them.  If abnormal or we want to clarify a result, we will call or mychart you to make sure you receive the message.  If you have questions or concerns or don't hear within 5 business days, please send [place] a message or call us.

## 2020-03-03 ENCOUNTER — Other Ambulatory Visit: Payer: Self-pay

## 2020-03-03 MED ORDER — JARDIANCE 10 MG PO TABS: 10.0000 mg | ORAL_TABLET | Freq: Every day | ORAL | 5 refills | Status: DC

## 2020-03-10 DIAGNOSIS — D1801 Hemangioma of skin and subcutaneous tissue: Secondary | ICD-10-CM | POA: Diagnosis not present

## 2020-03-10 DIAGNOSIS — L570 Actinic keratosis: Secondary | ICD-10-CM | POA: Diagnosis not present

## 2020-03-10 DIAGNOSIS — D225 Melanocytic nevi of trunk: Secondary | ICD-10-CM | POA: Diagnosis not present

## 2020-03-10 DIAGNOSIS — L905 Scar conditions and fibrosis of skin: Secondary | ICD-10-CM | POA: Diagnosis not present

## 2020-03-10 DIAGNOSIS — Z85828 Personal history of other malignant neoplasm of skin: Secondary | ICD-10-CM | POA: Diagnosis not present

## 2020-03-30 DIAGNOSIS — E119 Type 2 diabetes mellitus without complications: Secondary | ICD-10-CM | POA: Diagnosis not present

## 2020-03-30 LAB — HM DIABETES EYE EXAM

## 2020-04-12 ENCOUNTER — Other Ambulatory Visit: Payer: Self-pay

## 2020-04-12 MED ORDER — JARDIANCE 10 MG PO TABS: 10.0000 mg | ORAL_TABLET | Freq: Every day | ORAL | 1 refills | Status: DC

## 2020-04-25 ENCOUNTER — Other Ambulatory Visit: Payer: Self-pay | Admitting: Family Medicine

## 2020-05-02 ENCOUNTER — Other Ambulatory Visit: Payer: Self-pay | Admitting: Family Medicine

## 2020-05-23 DIAGNOSIS — Z20822 Contact with and (suspected) exposure to covid-19: Secondary | ICD-10-CM | POA: Diagnosis not present

## 2020-05-23 DIAGNOSIS — R000 Tachycardia, unspecified: Secondary | ICD-10-CM | POA: Diagnosis not present

## 2020-05-23 DIAGNOSIS — R509 Fever, unspecified: Secondary | ICD-10-CM | POA: Diagnosis not present

## 2020-05-31 DIAGNOSIS — L570 Actinic keratosis: Secondary | ICD-10-CM | POA: Diagnosis not present

## 2020-06-01 ENCOUNTER — Other Ambulatory Visit: Payer: Self-pay | Admitting: Family Medicine

## 2020-06-22 ENCOUNTER — Telehealth (INDEPENDENT_AMBULATORY_CARE_PROVIDER_SITE_OTHER): Payer: Medicare Other | Admitting: Family Medicine

## 2020-06-22 ENCOUNTER — Other Ambulatory Visit: Payer: Self-pay

## 2020-06-22 DIAGNOSIS — E1165 Type 2 diabetes mellitus with hyperglycemia: Secondary | ICD-10-CM | POA: Diagnosis not present

## 2020-06-22 DIAGNOSIS — R358 Other polyuria: Secondary | ICD-10-CM

## 2020-06-22 DIAGNOSIS — R5381 Other malaise: Secondary | ICD-10-CM

## 2020-06-22 DIAGNOSIS — R531 Weakness: Secondary | ICD-10-CM | POA: Diagnosis not present

## 2020-06-22 DIAGNOSIS — R631 Polydipsia: Secondary | ICD-10-CM | POA: Diagnosis not present

## 2020-06-22 DIAGNOSIS — R42 Dizziness and giddiness: Secondary | ICD-10-CM | POA: Diagnosis not present

## 2020-06-22 DIAGNOSIS — R3589 Other polyuria: Secondary | ICD-10-CM

## 2020-06-22 NOTE — Progress Notes (Signed)
Virtual Visit via Video Note  I connected with William Brewer  on 06/22/20 at  4:00 PM EDT by a video enabled telemedicine application and verified that I am speaking with the correct person using two identifiers.  Location patient: home Location provider:work or home office Persons participating in the virtual visit: patient, provider  I discussed the limitations of evaluation and management by telemedicine and the availability of in person appointments. The patient expressed understanding and agreed to proceed.   HPI:  Acute visit for  -started about 3 days ago -was at the beach and on the way home started to feel unwell -symptoms include increased thirst, frequent urination, feeling very tired, dizzy on and off, chills, worsening of neuropathy symptoms, some congestion in throat, fever of 101 today, feeling very weak - having difficulty with even walking across the room today  -fever responded to ibuprofen -denies stomach pain, NVD, cough, SOB -PMH diabetes, peripheral neuropathy - reports neuropathy has been much worse lately -fully vaccinated for COVID19, no known sick contacts -does not like to check his blood sugar, is not sure what his blood sugar is, does not check  ROS: See pertinent positives and negatives per HPI.  Past Medical History:  Diagnosis Date  . Allergy   . Basal cell carcinoma    leg  . Colon polyps   . Diabetes mellitus without complication ([place])   . DIVERTICULOSIS, COLON 05/26/2008  . Hyperlipidemia    refused treatment  . Hypertension   . Peripheral neuropathy     Past Surgical History:  Procedure Laterality Date  . APPENDECTOMY    . COLONOSCOPY    . LEFT HEART CATH AND CORONARY ANGIOGRAPHY N/A 03/26/2018   Procedure: LEFT HEART CATH AND CORONARY ANGIOGRAPHY;  Surgeon: Jettie Booze, MD;  Location: [facility];  Service: Cardiovascular;  Laterality: N/A;  . POLYPECTOMY    . ULTRASOUND GUIDANCE FOR VASCULAR ACCESS  03/26/2018   Procedure:  Ultrasound Guidance For Vascular Access;  Surgeon: Jettie Booze, MD;  Location: [facility];  Service: Cardiovascular;;    Family History  Adopted: Yes  Problem Relation Age of Onset  . Deep vein thrombosis Mother        cancer  . Cancer Mother        intestine  . Heart disease Father 11       CABG, former smoker but not heavy  . Breast cancer Daughter   . Alzheimer's disease Maternal Grandmother   . Aortic aneurysm Maternal Grandfather   . Hypertension Neg Hx   . Colon cancer Neg Hx     SOCIAL HX: see hpi   Current Outpatient Medications:  .  aspirin EC 81 MG tablet, Take by mouth daily., Disp: , Rfl:  .  empagliflozin (JARDIANCE) 10 MG TABS tablet, Take 10 mg by mouth daily before breakfast., Disp: 90 tablet, Rfl: 1 .  gabapentin (NEURONTIN) 300 MG capsule, Take 1 capsule three times a day, Disp: 270 capsule, Rfl: 3 .  lisinopril-hydrochlorothiazide (ZESTORETIC) 20-12.5 MG tablet, Take 1 tablet by mouth once daily, Disp: 90 tablet, Rfl: 3 .  lovastatin (MEVACOR) 40 MG tablet, TAKE 1 TABLET BY MOUTH AT BEDTIME, Disp: 90 tablet, Rfl: 0 .  Lysine HCl 1000 MG TABS, Take 1,000 mg by mouth daily. , Disp: , Rfl:  .  Magnesium 400 MG TABS, Take 500 mg by mouth daily. , Disp: , Rfl:  .  metFORMIN (GLUCOPHAGE) 1000 MG tablet, Take 1 tablet (1,000 mg total)  by mouth 2 (two) times daily with a meal., Disp: 180 tablet, Rfl: 3 .  zolpidem (AMBIEN) 5 MG tablet, TAKE 1 TABLET BY MOUTH AT BEDTIME AS NEEDED FOR SLEEP, Disp: 30 tablet, Rfl: 2  EXAM:  VITALS per patient if applicable:  GENERAL: alert, wife does some of the history for him  HEENT: atraumatic, conjunttiva clear, no obvious abnormalities on inspection of external nose and ears  NECK: normal movements of the head and neck  LUNGS: on inspection no signs of respiratory distress, breathing rate appears normal, no obvious gross SOB, gasping or wheezing  CV: no obvious cyanosis  MS: moves all visible extremities  without noticeable abnormality  PSYCH/NEURO: pleasant and cooperative, no obvious depression or anxiety, speech and thought processing grossly intact  ASSESSMENT AND PLAN:  Discussed the following assessment and plan:  Malaise  Weakness  Polyuria  Polydipsia  -we discussed possible serious and likely etiologies, options for evaluation and workup, limitations of telemedicine visit vs in person visit, treatment, treatment risks and precautions. Discussed potential causes of his symptoms and feel he needs prompt inperson evaluation for labs, urine studies, exam etc. They prefer to avoid the ER. Query elevated blood sugar and complications, infection vs other. Discussed options and offered to assist with transport. Wife feels and patient prefers that they drive to nearest [facility] for further evaluation. Explained that they may be sent on to the hospital pending initial evaluation.  > 20 minutes spent on this visit. .   I discussed the assessment and treatment plan with the patient. The patient was provided an opportunity to ask questions and all were answered. The patient agreed with the plan and demonstrated an understanding of the instructions.   The patient was advised to call back or seek an in-person evaluation if the symptoms worsen or if the condition fails to improve as anticipated.   Lucretia Kern, DO

## 2020-06-23 ENCOUNTER — Encounter (HOSPITAL_COMMUNITY): Payer: Self-pay | Admitting: Emergency Medicine

## 2020-06-23 ENCOUNTER — Emergency Department (HOSPITAL_COMMUNITY)
Admission: EM | Admit: 2020-06-23 | Discharge: 2020-06-23 | Disposition: A | Discharge status: Home / Self Care | Payer: Medicare Other | Attending: Emergency Medicine | Admitting: Emergency Medicine

## 2020-06-23 ENCOUNTER — Emergency Department (HOSPITAL_COMMUNITY): Payer: Medicare Other

## 2020-06-23 ENCOUNTER — Other Ambulatory Visit: Payer: Self-pay

## 2020-06-23 DIAGNOSIS — Z85828 Personal history of other malignant neoplasm of skin: Secondary | ICD-10-CM | POA: Insufficient documentation

## 2020-06-23 DIAGNOSIS — E1140 Type 2 diabetes mellitus with diabetic neuropathy, unspecified: Secondary | ICD-10-CM | POA: Insufficient documentation

## 2020-06-23 DIAGNOSIS — Z955 Presence of coronary angioplasty implant and graft: Secondary | ICD-10-CM | POA: Diagnosis not present

## 2020-06-23 DIAGNOSIS — R509 Fever, unspecified: Secondary | ICD-10-CM | POA: Diagnosis not present

## 2020-06-23 DIAGNOSIS — R110 Nausea: Secondary | ICD-10-CM | POA: Diagnosis not present

## 2020-06-23 DIAGNOSIS — I10 Essential (primary) hypertension: Secondary | ICD-10-CM | POA: Diagnosis not present

## 2020-06-23 DIAGNOSIS — Z20822 Contact with and (suspected) exposure to covid-19: Secondary | ICD-10-CM | POA: Diagnosis not present

## 2020-06-23 DIAGNOSIS — Z7982 Long term (current) use of aspirin: Secondary | ICD-10-CM | POA: Diagnosis not present

## 2020-06-23 DIAGNOSIS — Z7984 Long term (current) use of oral hypoglycemic drugs: Secondary | ICD-10-CM | POA: Insufficient documentation

## 2020-06-23 DIAGNOSIS — Z79899 Other long term (current) drug therapy: Secondary | ICD-10-CM | POA: Insufficient documentation

## 2020-06-23 DIAGNOSIS — I2510 Atherosclerotic heart disease of native coronary artery without angina pectoris: Secondary | ICD-10-CM | POA: Diagnosis not present

## 2020-06-23 LAB — URINALYSIS, ROUTINE W REFLEX MICROSCOPIC
Bacteria, UA: NONE SEEN
Bilirubin Urine: NEGATIVE
Glucose, UA: >=500 mg/dL — AB
Hgb urine dipstick: NEGATIVE
Ketones, ur: 80 mg/dL — AB
Leukocytes,Ua: NEGATIVE
Nitrite: NEGATIVE
Protein, ur: NEGATIVE mg/dL
Specific Gravity, Urine: 1.031 — ABNORMAL HIGH (ref 1.005–1.030)
pH: 5 (ref 5.0–8.0)

## 2020-06-23 LAB — CBC
HCT: 41.8 % (ref 39.0–52.0)
Hemoglobin: 13.8 g/dL (ref 13.0–17.0)
MCH: 30.8 pg (ref 26.0–34.0)
MCHC: 33 g/dL (ref 30.0–36.0)
MCV: 93.3 fL (ref 80.0–100.0)
Platelets: 237 10*3/uL (ref 150–400)
RBC: 4.48 MIL/uL (ref 4.22–5.81)
RDW: 12.7 % (ref 11.5–15.5)
WBC: 15.4 10*3/uL — ABNORMAL HIGH (ref 4.0–10.5)
nRBC: 0 % (ref 0.0–0.2)

## 2020-06-23 LAB — COMPREHENSIVE METABOLIC PANEL
ALT: 19 U/L (ref 0–44)
AST: 15 U/L (ref 15–41)
Albumin: 4.3 g/dL (ref 3.5–5.0)
Alkaline Phosphatase: 56 U/L (ref 38–126)
Anion gap: 14 (ref 5–15)
BUN: 35 mg/dL — ABNORMAL HIGH (ref 8–23)
CO2: 24 mmol/L (ref 22–32)
Calcium: 9.8 mg/dL (ref 8.9–10.3)
Chloride: 99 mmol/L (ref 98–111)
Creatinine, Ser: 1.2 mg/dL (ref 0.61–1.24)
GFR calc Af Amer: >60 mL/min (ref >60)
GFR calc non Af Amer: 60 mL/min — ABNORMAL LOW (ref >60)
Glucose, Bld: 185 mg/dL — ABNORMAL HIGH (ref 70–99)
Potassium: 4.1 mmol/L (ref 3.5–5.1)
Sodium: 137 mmol/L (ref 135–145)
Total Bilirubin: 1.1 mg/dL (ref 0.3–1.2)
Total Protein: 8.2 g/dL — ABNORMAL HIGH (ref 6.5–8.1)

## 2020-06-23 LAB — SARS CORONAVIRUS 2 BY RT PCR (HOSPITAL ORDER, PERFORMED IN ~~LOC~~ HOSPITAL LAB): SARS Coronavirus 2: NEGATIVE

## 2020-06-23 LAB — LACTIC ACID, PLASMA: Lactic Acid, Venous: 1.3 mmol/L (ref 0.5–1.9)

## 2020-06-23 MED ORDER — ACETAMINOPHEN 325 MG PO TABS
650.0000 mg | ORAL_TABLET | Freq: Once | ORAL | Status: AC
  Administered 2020-06-23: 650 mg via ORAL
  Filled 2020-06-23: qty 2

## 2020-06-23 MED ORDER — GABAPENTIN 300 MG PO CAPS
300.0000 mg | ORAL_CAPSULE | Freq: Once | ORAL | Status: AC
  Administered 2020-06-23: 300 mg via ORAL
  Filled 2020-06-23: qty 1

## 2020-06-23 MED ORDER — SODIUM CHLORIDE 0.9 % IV BOLUS
1000.0000 mL | Freq: Once | INTRAVENOUS | Status: AC
  Administered 2020-06-23: 1000 mL via INTRAVENOUS

## 2020-06-23 NOTE — ED Notes (Signed)
Only one set of cultures obtained. MD aware. Pt up for DC.

## 2020-06-23 NOTE — ED Notes (Signed)
Pt verbalizes understanding of DC instructions. Pt belongings returned and is ambulatory out of ED.  

## 2020-06-23 NOTE — Discharge Instructions (Addendum)
We saw in the ER for balance issues, dizziness and weakness.  Your labs here are indicative of likely an infectious process.  You did have a fever of 102 as well. The urine test, chest x-ray are not indicated of any specific infection.  Recommend that you see your primary care doctor in 2 to 3 days.  Return to the ER immediately with start having seizure-like activity, worsening confusion, hallucinations, severe headache, neck pain/stiffness.  Also return to the ER if you start having any new neurologic symptoms such as vision change, one-sided weakness, numbness, slurred speech.

## 2020-06-23 NOTE — ED Triage Notes (Signed)
Patient complains of weakness, loss of balance and dizziness since Sunday. Patient is very restless in triage, unable to stay still. Reports increased thirst and increased urination.

## 2020-06-24 ENCOUNTER — Telehealth: Payer: Self-pay | Admitting: Family Medicine

## 2020-06-24 ENCOUNTER — Other Ambulatory Visit: Payer: Self-pay | Admitting: Family Medicine

## 2020-06-24 NOTE — Telephone Encounter (Signed)
Virtual will be fine.

## 2020-06-24 NOTE — Telephone Encounter (Signed)
Patient was seen in ED  Nurse Assessment Nurse: William Nephew, RN, William Brewer Date/Time (Eastern Time): 06/23/2020 2:50:51 PM Confirm and document reason for call. If symptomatic, describe symptoms. ---Caller stated her husband has been sick ever since they got back from the beach over the weekend. He has had a variety of symptoms off and on, fever, weakness, loss of appetite. He spoke with provider in office yesterday and was told to get lab work and urinalysis done ( those results are pending ). He has Diabetes, today his blood sugar was 139. Today he still feels very weak and is not able to eat much. Has the patient had close contact with a person known or suspected to have the novel coronavirus illness OR traveled / lives in area with major community spread (including international travel) in the last 14 days from the onset of symptoms? * If Asymptomatic, screen for exposure and travel within the last 14 days. ---No Does the patient have any new or worsening symptoms? ---Yes Will a triage be completed? ---Yes Related visit to physician within the last 2 weeks? ---Yes Does the PT have any chronic conditions? (i.e. diabetes, asthma, this includes High risk factors for pregnancy, etc.) ---Yes List chronic conditions. ---HTN , Diabetes Is this a behavioral health or substance abuse call? ---NoPLEASE NOTE: All timestamps contained within this report are represented as [place] Standard Time. CONFIDENTIALTY NOTICE: This fax transmission is intended only for the addressee. It contains information that is legally privileged, confidential or otherwise protected from use or disclosure. If you are not the intended recipient, you are strictly prohibited from reviewing, disclosing, copying using or disseminating any of this information or taking any action in reliance on or regarding this information. If you have received this fax in error, please notify us immediately by telephone so that we can arrange for  its return to [place]. Phone: [phone], Toll-Free: [phone], Fax: [phone] Page: 2 of 2 Call Id: [id number] Guidelines Guideline Title Affirmed Question Affirmed Notes Nurse Date/Time William Brewer Time) Weakness (Generalized) and Fatigue [1] MODERATE weakness (i.e., interferes with work, school, normal activities) AND [2] cause unknown (Exceptions: weakness with acute minor illness, or weakness from poor fluid intake) William Nephew, RN, William Brewer 06/23/2020 2:51:42 PM Disp. Time William Brewer Time) Disposition Final User 06/23/2020 2:58:43 PM See HCP within 4 Hours (or PCP triage) Yes William Nephew, RN, William Brewer Disagree/Comply Comply Caller Understands Yes PreDisposition Call Doctor Care Advice Given Per Guideline SEE HCP WITHIN 4 HOURS (OR PCP TRIAGE): * IF OFFICE WILL BE OPEN: You need to be seen within the next 3 or 4 hours. Call your doctor (or NP/PA) now or as soon as the office opens. CALL BACK IF: * You become worse. CARE ADVICE given per Weakness and Fatigue (Adult) guideline. Comments User: William Mole, RN Date/Time William Brewer Time): 06/23/2020 3:12:29 PM I spoke with William Brewer and she informed me that no appts are available. I advised caller that since no appts are available she should take husband back to [facility] he was seen at yesterday or to the ER. She stated she did not want to return to [facility] since she was not happy with care he received so would go to ER instead Referrals GO TO FACILITY UNDECIDED GO TO [place]

## 2020-06-24 NOTE — Progress Notes (Signed)
Phone [phone] Virtual visit via Video note   Subjective:  Chief complaint: Chief Complaint  Patient presents with  . Follow-up   This visit type was conducted due to national recommendations for restrictions regarding the COVID-19 Pandemic (e.g. social distancing).  This format is felt to be most appropriate for this patient at this time balancing risks to patient and risks to population by having him in for in person visit.  No physical exam was performed (except for noted visual exam or audio findings with Telehealth visits).    Our team/I connected with William Brewer at 10:00 AM EDT by a video enabled telemedicine application (doxy.me or caregility through epic) and verified that I am speaking with the correct person using two identifiers.  Location patient: [place] Location provider: [facility], office Persons participating in the virtual visit:  patient  Our team/I discussed the limitations of evaluation and management by telemedicine and the availability of in person appointments. In light of current covid-19 pandemic, patient also understands that we are trying to protect them by minimizing in office contact if at all possible.  The patient expressed consent for telemedicine visit and agreed to proceed. Patient understands insurance will be billed.   Past Medical History-  Patient Active Problem List   Diagnosis Date Noted  . CAD (coronary artery disease) 11/08/2018    Priority: High  . Diabetes mellitus type II, controlled ([place]) 09/23/2013    Priority: High  . Idiopathic peripheral neuropathy 01/27/2009    Priority: High  . Aortic atherosclerosis ([place]) 06/25/2020    Priority: Medium  . Insomnia 11/08/2018    Priority: Medium  . Right groin hernia 10/24/2016    Priority: Medium  . Essential hypertension 03/26/2008    Priority: Medium  . Hyperlipidemia 06/21/2007    Priority: Medium  . Basal cell carcinoma of skin 10/28/2015    Priority: Low  . Fever blister  08/10/2015    Priority: Low  . History of colonic polyps 06/21/2007    Priority: Low    Medications- reviewed and updated Current Outpatient Medications  Medication Sig Dispense Refill  . aspirin EC 81 MG tablet Take by mouth daily.    . empagliflozin (JARDIANCE) 10 MG TABS tablet Take 10 mg by mouth daily before breakfast. 90 tablet 1  . gabapentin (NEURONTIN) 300 MG capsule Take 1 capsule three times a day 270 capsule 3  . lisinopril-hydrochlorothiazide (ZESTORETIC) 20-12.5 MG tablet Take 1 tablet by mouth once daily 90 tablet 3  . lovastatin (MEVACOR) 40 MG tablet TAKE 1 TABLET BY MOUTH AT BEDTIME 90 tablet 0  . Lysine HCl 1000 MG TABS Take 1,000 mg by mouth daily.     . Magnesium 400 MG TABS Take 500 mg by mouth daily.     . metFORMIN (GLUCOPHAGE) 1000 MG tablet Take 1 tablet (1,000 mg total) by mouth 2 (two) times daily with a meal. 180 tablet 3  . zolpidem (AMBIEN) 5 MG tablet TAKE 1 TABLET BY MOUTH AT BEDTIME AS NEEDED FOR SLEEP 30 tablet 2   No current facility-administered medications for this visit.     Objective:  Ht 6' (1.829 m)   Wt 186 lb (84.4 kg)   BMI 25.23 kg/m  self reported vitals Gen: NAD, resting comfortably Lungs: nonlabored, normal respiratory rate  Skin: appears dry, no obvious rash     Assessment and Plan   #ED F/U- fever, weakness, imbalance #Idiopathic neuropathy-significant worsening of pain and imbalance with febrile illness S:came back from the beach  on Saturday felt somewhat warm. On Sunday felt pretty sick and worsened up through Tuesday or Wednesday. Wednesday night had heavy sweats- those have been improving. Rectal temp peaked at 102 when evaluated in person. At home up to 101.  Had visit with Dr. Maudie Mercury 06/22/20 by video visit- suggested urgent care- they ultimately went to [facility] [place] [facility]. Initial blood sugar levels were not detected at urgent care- was sent home with insulin but sugar levels were not elevated. Not having  cough/congestion/sinus pressure/ear pain. Had a mild sre throat. No shortness of breath. No significant abdominal pain. No rash on the skin.   He  went to ER Wednesday night and had bloodwork and x-ry. He was there 7 hours and ultimately left. Physician mentioned staying the night and doing a spinal tap and he opted out of that. No ER physician note. Felt weakness and loss of balance. Neuropathy plan worsened. No recent tick bites . No hallucinations or worsening confusion- in fact improving at this point. States initially felt somewhat foggy but feels better. Denies headaches.   Yesterday strength up to 80% of overall health and now up to 90%.   Had also been seen by [facility] - a1c was noted 8.0 at that point- states had missed some doses of meds. Can restart exercise as well  Urine culture no growth. Blood cultures negative so far. covid 19 test negative. UA slightly dehydrated- ketones and glucose in urine but blood sugar was less than 200. Lactic acid was not elevated at 1.3. CMP largely normal- BUN high, glucose at 185, protein slightly high. White blood count high at 15.4 k. Chest x-ray reassuring in regards to pneumonia.  A/P: 74 year old male with febrile illness of unknown origin that thankfully is now improving without significant intervention. He did have extensive work-up in the emergency room as listed above. No tick bite to suggest tickborne illness. Opted out of lumbar puncture and considering improvement highly doubt meningitis. COVID-19 negative. No significant respiratory symptoms to suggest pneumonia. Urine culture negative as above. No abdominal pain to suggest abdominal infection. No rash to suggest cellulitis. -Patient has made significant improvement agrees to follow-up if have new or worsening symptoms -Interesting that febrile illness worsened neuropathy symptoms-ankle does have improved  #hyperlipidemia/aortic atherosclerosis-incidental finding on chest  x-ray/nonobstructive coronary artery disease S: Medication: Lovastatin 40 mg, aspirin 81 mg. No chest pain or shortness of breath reported Lab Results  Component Value Date   CHOL 125 03/02/2020   HDL 32.60 (L) 03/02/2020   LDLCALC 59 03/02/2020   LDLDIRECT 81.0 10/16/2019   TRIG 163.0 (H) 03/02/2020   CHOLHDL 4 03/02/2020   A/P: All of her issues appear stable. Continue current medications -LDL has been at goal of 70 or less. Compliant with aspirin. Blood pressure is well controlled. Risk factors appropriately modified for nonobstructive coronary disease and aortic atherosclerosis.  #hypertension S: medication: lisinopril hctz 20-12.5mg  Home readings #s: does not check BP Readings from Last 3 Encounters:  06/23/20 (!) 131/48  03/02/20 110/60  10/16/19 110/70  A/P: Good control last check and reasonable control now emergency room though low normal. Will recheck at his follow-up visit later in July  # Diabetes S: Medication: Has missed some doses of Metformin and Jardiance 10 mg but agrees to more consistent compliance and to start exercise Lab Results  Component Value Date   HGBA1C 8.5 (H) 03/02/2020   HGBA1C 8.8 (H) 10/16/2019   HGBA1C 7.7 (H) 11/08/2018   A/P: Reports A1c of 8 at  urgent care-hopefully with more consistency by follow-up we will see improvement in sugars-I likely would repeat an A1c at that time if he is okay with potential cost   Recommended follow up: Keep July follow-up or sooner if needed Future Appointments  Date Time Provider [place]  07/20/2020 11:20 AM Marin Olp, MD [facility]    Lab/Order associations:   ICD-10-CM   1. Febrile illness  R50.9   2. Aortic atherosclerosis (HCC)  I70.0   3. Hyperlipidemia, unspecified hyperlipidemia type  E78.5   4. Coronary artery disease involving native coronary artery of native heart without angina pectoris  I25.10   5. Idiopathic peripheral neuropathy  G60.9   6. Controlled type 2 diabetes  mellitus with diabetic polyneuropathy, without long-term current use of insulin (HCC)  E11.42    Return precautions advised.  Garret Reddish, MD

## 2020-06-24 NOTE — Telephone Encounter (Signed)
Patient is scheduled for tomorrow for 10 am

## 2020-06-24 NOTE — Telephone Encounter (Signed)
Noted  

## 2020-06-24 NOTE — Telephone Encounter (Signed)
Patient called in saying that the ED wanted him to follow up with his PCP within the next week, could we do a visit Tuesday 06/29/2020 either in office or virtually.

## 2020-06-25 ENCOUNTER — Encounter: Payer: Self-pay | Admitting: Family Medicine

## 2020-06-25 ENCOUNTER — Telehealth (INDEPENDENT_AMBULATORY_CARE_PROVIDER_SITE_OTHER): Payer: Medicare Other | Admitting: Family Medicine

## 2020-06-25 ENCOUNTER — Other Ambulatory Visit: Payer: Self-pay

## 2020-06-25 VITALS — Ht 72.0 in | Wt 186.0 lb

## 2020-06-25 DIAGNOSIS — I2510 Atherosclerotic heart disease of native coronary artery without angina pectoris: Secondary | ICD-10-CM

## 2020-06-25 DIAGNOSIS — R509 Fever, unspecified: Secondary | ICD-10-CM | POA: Diagnosis not present

## 2020-06-25 DIAGNOSIS — E785 Hyperlipidemia, unspecified: Secondary | ICD-10-CM | POA: Diagnosis not present

## 2020-06-25 DIAGNOSIS — I700 Atherosclerosis of aorta: Secondary | ICD-10-CM | POA: Diagnosis not present

## 2020-06-25 DIAGNOSIS — G609 Hereditary and idiopathic neuropathy, unspecified: Secondary | ICD-10-CM

## 2020-06-25 DIAGNOSIS — E1142 Type 2 diabetes mellitus with diabetic polyneuropathy: Secondary | ICD-10-CM

## 2020-06-25 LAB — URINE CULTURE: Culture: NO GROWTH

## 2020-06-27 NOTE — ED Provider Notes (Signed)
William Brewer Provider Note   CSN: [id number] Arrival date & time: 06/23/20  1541     History No chief complaint on file.   William Brewer is a 74 y.o. male.  HPI    74 year old male comes in a chief complaint of weakness, balance issues and dizziness.  His symptoms started 3 or 4 days ago.  Patient reports that he was at the beach last week and was not drinking a lot of water.  He started noticing episodes of dizziness, especially when he gets up and overall feels sluggish when he walks.  He has history of diabetic neuropathy, therefore his balance is always a little off.  Over time he has had persistent weakness however with poor appetite.  He reports increased thirst and increased urination.  His blood sugar was high last week but was closer to normal more recently.  Patient is having subjective fevers and chills.  Wife reports that patient has been " foggy" and more forgetful.  Patient denies any focal numbness, weakness, vision change, slurred speech.  No history of strokes.  No recent tick exposures or rashes.  Patient denies any severe neck pain or headache, but does have mild discomfort over the posterior part of his head.  Past Medical History:  Diagnosis Date  . Allergy   . Basal cell carcinoma    leg  . Colon polyps   . Diabetes mellitus without complication ([place])   . DIVERTICULOSIS, COLON 05/26/2008  . Hyperlipidemia    refused treatment  . Hypertension   . Peripheral neuropathy     Patient Active Problem List   Diagnosis Date Noted  . Aortic atherosclerosis ([place]) 06/25/2020  . Insomnia 11/08/2018  . CAD (coronary artery disease) 11/08/2018  . Right groin hernia 10/24/2016  . Basal cell carcinoma of skin 10/28/2015  . Fever blister 08/10/2015  . Diabetes mellitus type II, controlled ([place]) 09/23/2013  . Idiopathic peripheral neuropathy 01/27/2009  . Essential hypertension 03/26/2008  . Hyperlipidemia 06/21/2007  . History of  colonic polyps 06/21/2007    Past Surgical History:  Procedure Laterality Date  . APPENDECTOMY    . COLONOSCOPY    . LEFT HEART CATH AND CORONARY ANGIOGRAPHY N/A 03/26/2018   Procedure: LEFT HEART CATH AND CORONARY ANGIOGRAPHY;  Surgeon: Jettie Booze, MD;  Location: [facility];  Service: Cardiovascular;  Laterality: N/A;  . POLYPECTOMY    . ULTRASOUND GUIDANCE FOR VASCULAR ACCESS  03/26/2018   Procedure: Ultrasound Guidance For Vascular Access;  Surgeon: Jettie Booze, MD;  Location: [facility];  Service: Cardiovascular;;       Family History  Adopted: Yes  Problem Relation Age of Onset  . Deep vein thrombosis Mother        cancer  . Cancer Mother        intestine  . Heart disease Father 15       CABG, former smoker but not heavy  . Breast cancer Daughter   . Alzheimer's disease Maternal Grandmother   . Aortic aneurysm Maternal Grandfather   . Hypertension Neg Hx   . Colon cancer Neg Hx     Social History   Tobacco Use  . Smoking status: Never Smoker  . Smokeless tobacco: Never Used  Vaping Use  . Vaping Use: Never used  Substance Use Topics  . Alcohol use: Yes    Alcohol/week: 5.0 - 6.0 standard drinks    Types: 3 Cans of beer, 2 - 3 Standard  drinks or equivalent per week    Comment: 3 a week   . Drug use: No    Home Medications Prior to Admission medications   Medication Sig Start Date End Date Taking? Authorizing Provider  aspirin EC 81 MG tablet Take by mouth daily.    [provider]  empagliflozin (JARDIANCE) 10 MG TABS tablet Take 10 mg by mouth daily before breakfast. 04/12/20   Marin Olp, MD  gabapentin (NEURONTIN) 300 MG capsule Take 1 capsule three times a day 03/02/20   Marin Olp, MD  lisinopril-hydrochlorothiazide (ZESTORETIC) 20-12.5 MG tablet Take 1 tablet by mouth once daily 05/03/20   Marin Olp, MD  lovastatin (MEVACOR) 40 MG tablet TAKE 1 TABLET BY MOUTH AT BEDTIME 04/29/20   Marin Olp, MD  Lysine HCl 1000 MG TABS Take 1,000 mg by mouth daily.     [provider]  Magnesium 400 MG TABS Take 500 mg by mouth daily.     [provider]  metFORMIN (GLUCOPHAGE) 1000 MG tablet Take 1 tablet (1,000 mg total) by mouth 2 (two) times daily with a meal. 01/02/20   Marin Olp, MD  zolpidem (AMBIEN) 5 MG tablet TAKE 1 TABLET BY MOUTH AT BEDTIME AS NEEDED FOR SLEEP 06/01/20   Marin Olp, MD    Allergies    Trazodone and nefazodone  Review of Systems   Review of Systems  Constitutional: Positive for activity change and fever.  Gastrointestinal: Positive for nausea.  Allergic/Immunologic: Negative for immunocompromised state.  Hematological: Does not bruise/bleed easily.  All other systems reviewed and are negative.   Physical Exam Updated Vital Signs BP (!) 131/48   Pulse 86   Temp (!) 102.6 F (39.2 C) (Rectal)   Resp 18   Ht 6' (1.829 m)   Wt 79.8 kg   SpO2 95%   BMI 23.87 kg/m   Physical Exam Vitals and nursing note reviewed.  Constitutional:      Appearance: He is well-developed.  HENT:     Head: Normocephalic and atraumatic.  Eyes:     Extraocular Movements: Extraocular movements intact.     Conjunctiva/sclera: Conjunctivae normal.     Pupils: Pupils are equal, round, and reactive to light.  Neck:     Comments: No nuchal rigidity Cardiovascular:     Rate and Rhythm: Normal rate and regular rhythm.  Pulmonary:     Effort: Pulmonary effort is normal.     Breath sounds: Normal breath sounds.  Abdominal:     General: Bowel sounds are normal. There is no distension.     Palpations: Abdomen is soft. There is no mass.     Tenderness: There is no abdominal tenderness. There is no guarding or rebound.  Musculoskeletal:        General: No deformity.     Cervical back: Normal range of motion and neck supple.  Skin:    General: Skin is warm.  Neurological:     Mental Status: He is alert and oriented to person, place, and time.      Comments: Cerebellar exam is normal (finger to nose) Sensory exam normal for bilateral upper and lower extremities - and patient is able to discriminate between sharp and dull. Motor exam is 4+/5  Patient ambulated, he had slight ataxia     ED Results / Procedures / Treatments   Labs (all labs ordered are listed, but only abnormal results are displayed) Labs Reviewed  COMPREHENSIVE METABOLIC PANEL - Abnormal;  Notable for the following components:      Result Value   Glucose, Bld 185 (*)    BUN 35 (*)    Total Protein 8.2 (*)    GFR calc non Af Amer 60 (*)    All other components within normal limits  CBC - Abnormal; Notable for the following components:   WBC 15.4 (*)    All other components within normal limits  URINALYSIS, ROUTINE W REFLEX MICROSCOPIC - Abnormal; Notable for the following components:   Specific Gravity, Urine 1.031 (*)    Glucose, UA >=500 (*)    Ketones, ur 80 (*)    All other components within normal limits  URINE CULTURE  SARS CORONAVIRUS 2 BY RT PCR (HOSPITAL ORDER, [facility])  CULTURE, BLOOD (ROUTINE X 2)  LACTIC ACID, PLASMA    EKG EKG Interpretation  Date/Time:  Wednesday June 23 2020 15:54:20 EDT Ventricular Rate:  106 PR Interval:    QRS Duration: 87 QT Interval:  334 QTC Calculation: 444 R Axis:   -112 Text Interpretation: Sinus tachycardia Abnormal R-wave progression, late transition Inferior infarct, old 12 Lead; [place] Since last tracing rate faster Confirmed by Dorie Rank [id number]) on 06/23/2020 4:31:52 PM   Radiology No results found.  Procedures Procedures (including critical care time)  Medications Ordered in ED Medications  sodium chloride 0.9 % bolus 1,000 mL (0 mLs Intravenous Stopped 06/23/20 2109)  gabapentin (NEURONTIN) capsule 300 mg (300 mg Oral Given 06/23/20 2154)  acetaminophen (TYLENOL) tablet 650 mg (650 mg Oral Given 06/23/20 2154)    ED Course  I have reviewed the triage vital  signs and the nursing notes.  Pertinent labs & imaging results that were available during my care of the patient were reviewed by me and considered in my medical decision making (see chart for details).    MDM Rules/Calculators/A&P                          74 year old male comes in a chief complaint of weakness, dizziness, subjective fevers and chills.  He also has balance issues, increased thirst and increased urination.  Patient is diabetic and has peripheral neuropathy with baseline balance issues.  UA is positive for ketones and high specific gravity.  Fluids to be given in the ED.  I ordered rectal temp and he does have a fever.  Patient has no cough, burning with urination and the UA appears to be clean. Clinical suspicion for both pneumonia and cystitis is low.  Abdominal exam remains benign.  COVID-19 test is negative  Neuro exam is nonfocal.  Ataxic gait, but patient reports that this is normal for him. Wife reports that patient has been more foggy and has had some confusion, forgetfulness type event.  He has no meningismus.  Does not appear that patient had any tick exposures or tick bites.  He denies any rash.  One of the concerns is that he could be having viral encephalitis.  Patient was reassessed multiple times.  Ultimately, the ED work-up in its entirety has been normal.  In the setting of elevated white count, fever, diabetes with nonspecific neurologic symptoms, I did discuss the aggressive approach of getting an LP to rule out encephalitis versus conservative approach of wait and watch approach with close PCP follow-up and return to the ER if the symptoms get worse.  Patient and family have decided to go with the conservative approach.  He will return to  the ER if he starts developing any neurologic symptoms that are new, starts having worsening headaches, confusion, seizure.  William Brewer was evaluated in Emergency Department on 06/27/2020 for the symptoms described in  the history of present illness. He was evaluated in the context of the global COVID-19 pandemic, which necessitated consideration that the patient might be at risk for infection with the SARS-CoV-2 virus that causes COVID-19. Institutional protocols and algorithms that pertain to the evaluation of patients at risk for COVID-19 are in a state of rapid change based on information released by regulatory bodies including the CDC and federal and state organizations. These policies and algorithms were followed during the patient's care in the ED.   Final Clinical Impression(s) / ED Diagnoses Final diagnoses:  Acute febrile illness    Rx / DC Orders ED Discharge Orders    None       Varney Biles, MD 06/27/20 1422

## 2020-06-29 LAB — CULTURE, BLOOD (ROUTINE X 2): Culture: NO GROWTH

## 2020-07-12 ENCOUNTER — Other Ambulatory Visit: Payer: Self-pay | Admitting: Family Medicine

## 2020-07-19 NOTE — Patient Instructions (Addendum)
Schedule follow up with dentist  can do shingrix at pharmacy if you would like   Low normal blood pressure today on lisinopril-hctz combo pill-we are going to stop the hctz  (and use lisinopril alone at 20mg ) and he will monitor at home- can continue just lisinopril 20mg   as long as <135/85 blood pressure   Lab please come to room If you have mychart- we will send your results within 3 business days of [place] receiving them.  If you do not have mychart- we will call you about results within 5 business days of [place] receiving them.

## 2020-07-19 NOTE — Progress Notes (Signed)
Phone: [phone]   Subjective:  Patient presents today for their annual physical. Chief complaint-noted.   See problem oriented charting- Review of Systems  Constitutional: Negative for chills and fever.  HENT: Negative for congestion, hearing loss and nosebleeds.   Eyes: Positive for blurred vision (switched eyeglasses and may need adjustment). Negative for double vision.  Respiratory: Positive for shortness of breath (mild at times). Negative for cough.   Cardiovascular: Negative for chest pain and palpitations.  Gastrointestinal: Negative for constipation, diarrhea, nausea and vomiting.  Genitourinary: Negative for dysuria and frequency.  Musculoskeletal: Negative for falls and neck pain.  Skin: Negative for itching and rash.  Neurological: Positive for dizziness (orthostatic symptoms). Negative for headaches.  Endo/Heme/Allergies: Negative for polydipsia. Does not bruise/bleed easily.  Psychiatric/Behavioral: Negative for hallucinations, substance abuse and suicidal ideas.   The following were reviewed and entered/updated in epic: Past Medical History:  Diagnosis Date  . Allergy   . Basal cell carcinoma    leg  . Colon polyps   . Diabetes mellitus without complication ([place])   . DIVERTICULOSIS, COLON 05/26/2008  . Hyperlipidemia    refused treatment  . Hypertension   . Peripheral neuropathy    Patient Active Problem List   Diagnosis Date Noted  . CAD (coronary artery disease) 11/08/2018    Priority: High  . Diabetes mellitus type II, controlled ([place]) 09/23/2013    Priority: High  . Idiopathic peripheral neuropathy 01/27/2009    Priority: High  . Aortic atherosclerosis ([place]) 06/25/2020    Priority: Medium  . Insomnia 11/08/2018    Priority: Medium  . Right groin hernia 10/24/2016    Priority: Medium  . Essential hypertension 03/26/2008    Priority: Medium  . Hyperlipidemia 06/21/2007    Priority: Medium  . Basal cell carcinoma of skin 10/28/2015    Priority:  Low  . Fever blister 08/10/2015    Priority: Low  . History of colonic polyps 06/21/2007    Priority: Low   Past Surgical History:  Procedure Laterality Date  . APPENDECTOMY    . COLONOSCOPY    . LEFT HEART CATH AND CORONARY ANGIOGRAPHY N/A 03/26/2018   Procedure: LEFT HEART CATH AND CORONARY ANGIOGRAPHY;  Surgeon: Jettie Booze, MD;  Location: [facility];  Service: Cardiovascular;  Laterality: N/A;  . POLYPECTOMY    . ULTRASOUND GUIDANCE FOR VASCULAR ACCESS  03/26/2018   Procedure: Ultrasound Guidance For Vascular Access;  Surgeon: Jettie Booze, MD;  Location: [facility];  Service: Cardiovascular;;    Family History  Adopted: Yes  Problem Relation Age of Onset  . Deep vein thrombosis Mother        cancer  . Cancer Mother        intestine  . Heart disease Father 48       CABG, former smoker but not heavy  . Breast cancer Daughter   . Alzheimer's disease Maternal Grandmother   . Aortic aneurysm Maternal Grandfather   . Hypertension Neg Hx   . Colon cancer Neg Hx     Medications- reviewed and updated Current Outpatient Medications  Medication Sig Dispense Refill  . aspirin EC 81 MG tablet Take by mouth daily.    . empagliflozin (JARDIANCE) 10 MG TABS tablet Take 10 mg by mouth daily before breakfast. 90 tablet 1  . gabapentin (NEURONTIN) 300 MG capsule Take 1 capsule three times a day 270 capsule 3  . lovastatin (MEVACOR) 40 MG tablet TAKE 1 TABLET BY MOUTH AT BEDTIME 90  tablet 0  . Lysine HCl 1000 MG TABS Take 1,000 mg by mouth daily.     . Magnesium 400 MG TABS Take 500 mg by mouth daily.     . metFORMIN (GLUCOPHAGE) 1000 MG tablet Take 1 tablet (1,000 mg total) by mouth 2 (two) times daily with a meal. 180 tablet 3  . zolpidem (AMBIEN) 5 MG tablet TAKE 1 TABLET BY MOUTH AT BEDTIME AS NEEDED FOR SLEEP 30 tablet 2  . lisinopril (ZESTRIL) 20 MG tablet Take 1 tablet (20 mg total) by mouth daily. 90 tablet 3   No current facility-administered  medications for this visit.    Allergies-reviewed and updated Allergies  Allergen Reactions  . Trazodone And Nefazodone     Worsened his neuropathy after several months    Social History   Social History Narrative   Married 1973. 2 children. 2 grandkids. Son in [place]. Daughter 5 minutes away.       Retired Worked form Administrator, arts: support group for peripheral neuropathy, chess, words with friends, active with [employer] college (board of advisors)   Objective  Objective:  BP (!) 100/60   Pulse 79   Temp 98.4 F (36.9 C)   Ht 6' (1.829 m)   Wt 181 lb 12.8 oz (82.5 kg)   SpO2 97%   BMI 24.66 kg/m  Gen: NAD, resting comfortably HEENT: Mucous membranes are moist. Oropharynx normal Neck: no thyromegaly CV: RRR no murmurs rubs or gallops Lungs: CTAB no crackles, wheeze, rhonchi Abdomen: soft/nontender/nondistended/normal bowel sounds. No rebound or guarding.  Ext: no edema Skin: warm, dry Neuro: grossly normal, moves all extremities, PERRLA   Assessment and Plan  74 y.o. male presenting for annual physical.  Health Maintenance counseling: 1. Anticipatory guidance: Patient counseled regarding regular dental exams - advised q6 months- knows needs to set up, eye exams -yearly,  avoiding smoking and second hand smoke , limiting alcohol to 2 beverages per day - 1 beer a week.   2. Risk factor reduction:  Advised patient of need for regular exercise and diet rich and fruits and vegetables to reduce risk of heart attack and stroke. Exercise- not exercising recently- encouraged to start. Diet-down 8 lbs from last visit- trying to eat better at times- cut down on sweets. He would like to be on 170s.  Wt Readings from Last 3 Encounters:  07/20/20 181 lb 12.8 oz (82.5 kg)  06/25/20 186 lb (84.4 kg)  06/23/20 176 lb (79.8 kg)  3. Immunizations/screenings/ancillary studies- can do shingrix at pharmacy if you would like  Immunization  History  Administered Date(s) Administered  . Fluad Quad(high Dose 65+) 10/16/2019  . Influenza, High Dose Seasonal PF 10/24/2016, 02/12/2018, 11/08/2018  . Influenza,inj,Quad PF,6+ Mos 09/23/2013, 11/27/2014, 10/28/2015  . PFIZER SARS-COV-2 Vaccination 01/30/2020, 02/24/2020  . Pneumococcal Conjugate-13 04/29/2015  . Pneumococcal Polysaccharide-23 09/20/2012  . Td 05/26/2008  . Tdap 05/26/2008, 01/17/2020  . Zoster 09/20/2012  4. Prostate cancer screening- opts out of PSA screening due to potential risks  Lab Results  Component Value Date   PSA 1.79 09/13/2012   PSA 1.38 07/18/2011   PSA 1.79 08/02/2010   5. Colon cancer screening - 08/30/16 with 10 year repeat vs stopping- will decide based on guidelines at that time 6. Skin cancer screening- follows with dermatology. advised regular sunscreen use- encouraged to start 7. Never smoker 8. STD screening - monogomous  Status of chronic or acute concerns   #hypertension S: medication: Lisinopril-hctz  20-12.5Mg .  BP Readings from Last 3 Encounters:  07/20/20 (!) 100/60  06/23/20 (!) 131/48  03/02/20 110/60  A/P: Low normal blood pressure today with mild orthostatic symptoms-we are going to stop the hctz and he will monitor at home- can continue just lisinopril as long as <135/85  # Diabetes S: Medication: metformin 1000Mg  twice daily-increased from 500 mg twice daily.  Plan was to start [place] 10 mg-and he has been tolerating that.   Exercise and diet- eating better or jardiance helping. Needs to start exercise Lab Results  Component Value Date   HGBA1C 8.5 (H) 03/02/2020   HGBA1C 8.8 (H) 10/16/2019   HGBA1C 7.7 (H) 11/08/2018   A/P: update a1c on jardiance addition to baseline metformin- hopefully improved  #hyperlipidemia/nonobstructive CAD/aortic atherosclerosis S: Medication: lovastatin 40Mg , aspirin 81 mg Lab Results  Component Value Date   CHOL 125 03/02/2020   HDL 32.60 (L) 03/02/2020   LDLCALC 59 03/02/2020    LDLDIRECT 81.0 10/16/2019   TRIG 163.0 (H) 03/02/2020   CHOLHDL 4 03/02/2020   cath 03/26/18   "Dist RCA lesion is 50% stenosed.  Mid Cx lesion is 40% stenosed.  Mid LAD lesion is 25% stenosed.  The left ventricular ejection fraction is 50-55% by visual estimate.  LV end diastolic pressure is low.  The left ventricular systolic function is normal.  There is no aortic valve stenosis.  Ost 1st Diag lesion is 75% stenosed.  Right radial loop noted by angiogram. Would not use right radial approach in the future if cath was needed. " A/P: Last LDL looked excellent below seventy-continue current medications including aspirin.   #idiopathic Neuropathy (predates diabetes)-we increase gabapentin to 300 mg three times a day at last visit.  We also had discussed at last visit possibly using horizant which is a longer acting but more expensive version of gabapentin essentially-600 mg twice daily . Perhaps mild improvement- states would not want to stop this. - hed prefer to try 400mg  TID potentially  #Insomnia-Ambien 5 mg seems to be working well.  Belsomra was expensive.  Trazodone was not effective   #mild shortness of breath at times- intermittent and seems to be more outdoors- wonders if this is allergy related. If worsening issues with increasing exercise will need cardiology follow up  # very mild memory issues- states may forget where memory issues are. Possible brain fog with gabapentin. Also on [employer]  Component Value Date   [id number] 08/02/2010  -check b12 issues  # loss of son last September- ongoing grieving. Tries to think forward and focus on his grandkids.   Recommended follow up: Return in about 14 weeks (around 10/26/2020) for follow up- or sooner if needed.  Lab/Order associations: not fasting- had toast this AM   ICD-10-CM   1. Preventative health care  Z00.00 Comprehensive metabolic panel    CBC with Differential/Platelet    Vitamin B12     Hemoglobin A1c    LDL cholesterol, direct  2. Essential hypertension  I10 Comprehensive metabolic panel    CBC with Differential/Platelet  3. Controlled type 2 diabetes mellitus with diabetic polyneuropathy, without long-term current use of insulin (HCC)  E11.42 Hemoglobin A1c  4. Hyperlipidemia, unspecified hyperlipidemia type  E78.5 LDL cholesterol, direct  5. Idiopathic peripheral neuropathy  G60.9   6. Coronary artery disease involving native coronary artery of native heart without angina pectoris  I25.10   7. Primary insomnia  F51.01   8. Aortic atherosclerosis (HCC)  I70.0   9. High  risk medication use  Z79.899 Vitamin B12    Meds ordered this encounter  Medications  . lisinopril (ZESTRIL) 20 MG tablet    Sig: Take 1 tablet (20 mg total) by mouth daily.    Dispense:  90 tablet    Refill:  3   Return precautions advised.  Garret Reddish, MD

## 2020-07-20 ENCOUNTER — Ambulatory Visit (INDEPENDENT_AMBULATORY_CARE_PROVIDER_SITE_OTHER): Payer: Medicare Other | Admitting: Family Medicine

## 2020-07-20 ENCOUNTER — Encounter: Payer: Self-pay | Admitting: Family Medicine

## 2020-07-20 ENCOUNTER — Other Ambulatory Visit: Payer: Self-pay

## 2020-07-20 VITALS — BP 100/60 | HR 79 | Temp 98.4°F | Ht 72.0 in | Wt 181.8 lb

## 2020-07-20 DIAGNOSIS — I2510 Atherosclerotic heart disease of native coronary artery without angina pectoris: Secondary | ICD-10-CM

## 2020-07-20 DIAGNOSIS — Z79899 Other long term (current) drug therapy: Secondary | ICD-10-CM | POA: Diagnosis not present

## 2020-07-20 DIAGNOSIS — E785 Hyperlipidemia, unspecified: Secondary | ICD-10-CM | POA: Diagnosis not present

## 2020-07-20 DIAGNOSIS — E1142 Type 2 diabetes mellitus with diabetic polyneuropathy: Secondary | ICD-10-CM | POA: Diagnosis not present

## 2020-07-20 DIAGNOSIS — G609 Hereditary and idiopathic neuropathy, unspecified: Secondary | ICD-10-CM

## 2020-07-20 DIAGNOSIS — Z0000 Encounter for general adult medical examination without abnormal findings: Secondary | ICD-10-CM | POA: Diagnosis not present

## 2020-07-20 DIAGNOSIS — I10 Essential (primary) hypertension: Secondary | ICD-10-CM | POA: Diagnosis not present

## 2020-07-20 DIAGNOSIS — F5101 Primary insomnia: Secondary | ICD-10-CM

## 2020-07-20 DIAGNOSIS — I700 Atherosclerosis of aorta: Secondary | ICD-10-CM

## 2020-07-20 MED ORDER — LISINOPRIL 20 MG PO TABS
20.0000 mg | ORAL_TABLET | Freq: Every day | ORAL | 3 refills | Status: DC
Start: 2020-07-20 — End: 2021-07-04

## 2020-07-20 NOTE — Assessment & Plan Note (Addendum)
S: medication: Lisinopril-hctz 20-12.5Mg .  BP Readings from Last 3 Encounters:  07/20/20 (!) 100/60  06/23/20 (!) 131/48  03/02/20 110/60  A/P: Low normal blood pressure today with mild orthostatic symptoms-we are going to stop the hctz and he will monitor at home- can continue just lisinopril as long as <135/85

## 2020-07-20 NOTE — Addendum Note (Signed)
Addended by: Liliane Channel on: 07/20/2020 12:06 PM   Modules accepted: Orders

## 2020-07-21 LAB — CBC WITH DIFFERENTIAL/PLATELET
Absolute Monocytes: 502 cells/uL (ref 200–950)
Basophils Absolute: 29 cells/uL (ref 0–200)
Basophils Relative: 0.5 %
Eosinophils Absolute: 131 cells/uL (ref 15–500)
Eosinophils Relative: 2.3 %
HCT: 45 % (ref 38.5–50.0)
Hemoglobin: 14.8 g/dL (ref 13.2–17.1)
Lymphs Abs: 992 cells/uL (ref 850–3900)
MCH: 30.5 pg (ref 27.0–33.0)
MCHC: 32.9 g/dL (ref 32.0–36.0)
MCV: 92.6 fL (ref 80.0–100.0)
MPV: 10.5 fL (ref 7.5–12.5)
Monocytes Relative: 8.8 %
Neutro Abs: 4047 cells/uL (ref 1500–7800)
Neutrophils Relative %: 71 %
Platelets: 193 10*3/uL (ref 140–400)
RBC: 4.86 10*6/uL (ref 4.20–5.80)
RDW: 13.1 % (ref 11.0–15.0)
Total Lymphocyte: 17.4 %
WBC: 5.7 10*3/uL (ref 3.8–10.8)

## 2020-07-21 LAB — COMPREHENSIVE METABOLIC PANEL
AG Ratio: 1.7 (calc) (ref 1.0–2.5)
ALT: 26 U/L (ref 9–46)
AST: 22 U/L (ref 10–35)
Albumin: 4.5 g/dL (ref 3.6–5.1)
Alkaline phosphatase (APISO): 49 U/L (ref 35–144)
BUN: 25 mg/dL (ref 7–25)
CO2: 18 mmol/L — ABNORMAL LOW (ref 20–32)
Calcium: 9.6 mg/dL (ref 8.6–10.3)
Chloride: 103 mmol/L (ref 98–110)
Creat: 1.12 mg/dL (ref 0.70–1.18)
Globulin: 2.6 g/dL (calc) (ref 1.9–3.7)
Glucose, Bld: 174 mg/dL — ABNORMAL HIGH (ref 65–99)
Potassium: 4 mmol/L (ref 3.5–5.3)
Sodium: 135 mmol/L (ref 135–146)
Total Bilirubin: 0.6 mg/dL (ref 0.2–1.2)
Total Protein: 7.1 g/dL (ref 6.1–8.1)

## 2020-07-21 LAB — HEMOGLOBIN A1C
Hgb A1c MFr Bld: 7.6 % of total Hgb — ABNORMAL HIGH (ref <5.7)
Mean Plasma Glucose: 171 (calc)
eAG (mmol/L): 9.5 (calc)

## 2020-07-21 LAB — LDL CHOLESTEROL, DIRECT: Direct LDL: 72 mg/dL (ref <100)

## 2020-07-21 LAB — VITAMIN B12: Vitamin B-12: 412 pg/mL (ref 200–1100)

## 2020-07-28 ENCOUNTER — Encounter: Payer: Self-pay | Admitting: Podiatry

## 2020-07-28 ENCOUNTER — Ambulatory Visit: Payer: Medicare Other | Admitting: Podiatry

## 2020-07-28 ENCOUNTER — Other Ambulatory Visit: Payer: Self-pay

## 2020-07-28 ENCOUNTER — Ambulatory Visit (INDEPENDENT_AMBULATORY_CARE_PROVIDER_SITE_OTHER): Payer: Medicare Other

## 2020-07-28 DIAGNOSIS — E1142 Type 2 diabetes mellitus with diabetic polyneuropathy: Secondary | ICD-10-CM | POA: Diagnosis not present

## 2020-07-28 DIAGNOSIS — B351 Tinea unguium: Secondary | ICD-10-CM

## 2020-07-28 DIAGNOSIS — L97519 Non-pressure chronic ulcer of other part of right foot with unspecified severity: Secondary | ICD-10-CM | POA: Diagnosis not present

## 2020-07-28 DIAGNOSIS — L97529 Non-pressure chronic ulcer of other part of left foot with unspecified severity: Secondary | ICD-10-CM

## 2020-07-28 DIAGNOSIS — E08621 Diabetes mellitus due to underlying condition with foot ulcer: Secondary | ICD-10-CM

## 2020-07-28 DIAGNOSIS — G609 Hereditary and idiopathic neuropathy, unspecified: Secondary | ICD-10-CM | POA: Diagnosis not present

## 2020-07-28 MED ORDER — MUPIROCIN 2 % EX OINT
1.0000 | TOPICAL_OINTMENT | Freq: Two times a day (BID) | CUTANEOUS | 2 refills | Status: DC
Start: 2020-07-28 — End: 2020-09-02

## 2020-07-28 MED ORDER — AMOXICILLIN-POT CLAVULANATE 875-125 MG PO TABS: 1.0000 | ORAL_TABLET | Freq: Two times a day (BID) | ORAL | 0 refills | Status: DC

## 2020-07-28 NOTE — Progress Notes (Signed)
  Subjective:  Patient ID: William Brewer, male    DOB: August 21, 1946,  MRN: [id number]  Chief Complaint  Patient presents with  . Diabetic Ulcer    Bilateral hallux and L 2nd toe. x10 days. Pt stated, "I stepped out of the pool and onto pebble-y concrete. Saw blood on the concrete and knew I scraped my feet. I can feel some pain in the right, so it must be deep. Not consistently covering the wounds. No fever/chills/N&V/pus/odor".    74 y.o. male presents with the above complaint. History confirmed with patient.   Objective:  Physical Exam: warm, good capillary refill, normal DP and PT pulses and absent light touch and protective sensation bilateral plantar foot. Left Foot: Hallux plantar medial ulceration measuring 2 cm x 1 cm x 0.1 cm, partial-thickness and limited to skin, surrounding hyperkeratosis Right Foot: Hallux plantar ulceration measuring 2.2 cm x 1.2 cm x 0.3 cm, full-thickness with exposure of subcutaneous tissue, mixed fibrogranular base.  Mild erythema to base of toe   Radiographs: X-ray of bilateral feet: No evidence of retained foreign body, osteomyelitis, or soft tissue emphysema Assessment:   1. Bilateral diabetic foot ulcer associated with secondary diabetes mellitus ([place])   2. Idiopathic peripheral neuropathy      Plan:  Patient was evaluated and treated and all questions answered.  Patient educated on diabetes. Discussed proper diabetic foot care and discussed risks and complications of disease. Educated patient in depth on reasons to return to the office immediately should he/she discover anything concerning or new on the feet. All questions answered. Discussed proper shoes as well.   Long-term he will need diabetic extra-depth shoes with custom molded accommodative inserts to prevent ulcer recurrence.  Advised him to never go barefoot, especially outdoors.   -XR reviewed with patient -Dressing applied consisting of Iodosorb and DSD -Offload ulcer with  surgical shoe -Surgical shoe dispensed -Wound cleansed and debrided  Procedure: Selective Debridement of Wound Rationale: Removal of devitalized tissue from the wound to promote healing.  Pre-Debridement Wound Measurements: 2.2 cm x 1.2 cm x 0.3 cm  Post-Debridement Wound Measurements: same as pre-debridement. Type of Debridement: sharp selective Tissue Removed: Devitalized soft-tissue Dressing: Dry, sterile, compression dressing. Disposition: Patient tolerated procedure well. Patient to return in 1 week for follow-up.   Return in about 1 week (around 08/04/2020).

## 2020-07-28 NOTE — Patient Instructions (Signed)
Change the dressings daily after bathing with the mupirocin ointment and a dry gauze dressing. Wear the surgical shoes at all times while walking, try to rest as much as possible

## 2020-08-05 ENCOUNTER — Other Ambulatory Visit: Payer: Self-pay

## 2020-08-05 ENCOUNTER — Encounter: Payer: Self-pay | Admitting: Podiatry

## 2020-08-05 ENCOUNTER — Ambulatory Visit: Payer: Medicare Other | Admitting: Podiatry

## 2020-08-05 DIAGNOSIS — L97519 Non-pressure chronic ulcer of other part of right foot with unspecified severity: Secondary | ICD-10-CM

## 2020-08-05 DIAGNOSIS — L97529 Non-pressure chronic ulcer of other part of left foot with unspecified severity: Secondary | ICD-10-CM | POA: Diagnosis not present

## 2020-08-05 DIAGNOSIS — M2042 Other hammer toe(s) (acquired), left foot: Secondary | ICD-10-CM

## 2020-08-05 DIAGNOSIS — E1142 Type 2 diabetes mellitus with diabetic polyneuropathy: Secondary | ICD-10-CM

## 2020-08-05 DIAGNOSIS — M2041 Other hammer toe(s) (acquired), right foot: Secondary | ICD-10-CM | POA: Diagnosis not present

## 2020-08-05 DIAGNOSIS — E08621 Diabetes mellitus due to underlying condition with foot ulcer: Secondary | ICD-10-CM

## 2020-08-05 NOTE — Progress Notes (Signed)
  Subjective:  Patient ID: William Brewer, male    DOB: 06-Nov-1946,  MRN: [id number]  Chief Complaint  Patient presents with  . Blister      f/u blisters both feet    75 y.o. male returns with the above complaint. History confirmed with patient.  Has been changing bandages with mupirocin daily at home.  Objective:  Physical Exam: warm, good capillary refill, normal DP and PT pulses and absent light touch and protective sensation bilateral plantar foot. Left Foot: Hallux plantar medial ulceration measuring 2 cm x 1.5 cm x 0.1 cm, partial-thickness and limited to skin, surrounding hyperkeratosis, similar lesion is now present on the distal toe 0.5 cm x 0.6 cm x 0.1 cm Right Foot: Hallux plantar ulceration measuring 2.5 cm x 1.2 cm x 0.3 cm, full-thickness with exposure of subcutaneous tissue, mixed fibrogranular base.        Assessment:   No diagnosis found.   Plan:  Patient was evaluated and treated and all questions answered.  Patient educated on diabetes. Discussed proper diabetic foot care and discussed risks and complications of disease. Educated patient in depth on reasons to return to the office immediately should he/she discover anything concerning or new on the feet. All questions answered. Discussed proper shoes as well.    -XR reviewed with patient -Dressings applied consisting of Iodosorb and DSD -Continue offload ulcer with surgical shoe and peg assist device -Wound cleansed and debrided  Procedure: Selective Debridement of Wound Rationale: Removal of devitalized tissue from the wound to promote healing.  Pre-Debridement Wound Measurements: 2.5 cm x 1.2 cm x 0.3 cm  Post-Debridement Wound Measurements: same as pre-debridement. Type of Debridement: sharp selective Tissue Removed: Devitalized soft-tissue Dressing: Dry, sterile, compression dressing. Disposition: Patient tolerated procedure well. Patient to return in 1 week for follow-up.   Return in about 1  week (around 08/12/2020) for wound re-check.

## 2020-08-11 ENCOUNTER — Other Ambulatory Visit: Payer: Self-pay | Admitting: Family Medicine

## 2020-08-12 ENCOUNTER — Ambulatory Visit: Payer: Medicare Other | Admitting: Podiatry

## 2020-08-12 ENCOUNTER — Other Ambulatory Visit: Payer: Self-pay

## 2020-08-12 DIAGNOSIS — M2042 Other hammer toe(s) (acquired), left foot: Secondary | ICD-10-CM

## 2020-08-12 DIAGNOSIS — E08621 Diabetes mellitus due to underlying condition with foot ulcer: Secondary | ICD-10-CM

## 2020-08-12 DIAGNOSIS — G629 Polyneuropathy, unspecified: Secondary | ICD-10-CM

## 2020-08-12 DIAGNOSIS — L97519 Non-pressure chronic ulcer of other part of right foot with unspecified severity: Secondary | ICD-10-CM | POA: Diagnosis not present

## 2020-08-12 DIAGNOSIS — L97529 Non-pressure chronic ulcer of other part of left foot with unspecified severity: Secondary | ICD-10-CM

## 2020-08-12 DIAGNOSIS — E1142 Type 2 diabetes mellitus with diabetic polyneuropathy: Secondary | ICD-10-CM | POA: Diagnosis not present

## 2020-08-12 NOTE — Progress Notes (Signed)
  Subjective:  Patient ID: William Brewer, male    DOB: August 10, 1946,  MRN: [id number]  Chief Complaint  Patient presents with  . Diabetic Ulcer    bilateral great toe, and second left toe, 1 week follow up    74 y.o. male returns with the above complaint. History confirmed with patient.  Has been changing bandages with mupirocin daily at home.  Objective:  Physical Exam: warm, good capillary refill, normal DP and PT pulses and absent light touch and protective sensation bilateral plantar foot. Left Foot: Hallux plantar medial ulceration measuring 1.8 x 1.3 x 0.1 cm, partial-thickness and limited to skin, surrounding hyperkeratosis, similar lesion is now present on the distal 2nd toe 0.3 x 0.4 x 0.1 cm Right Foot: Hallux plantar ulceration measuring 2.2 x 1.0 x 0.2 cm, full-thickness with exposure of subcutaneous tissue, fully granular base.    Assessment:   1. Bilateral diabetic foot ulcer associated with secondary diabetes mellitus ([place])   2. Type 2 diabetes mellitus with polyneuropathy (HCC)   3. Hammertoe of left foot   4. Neuropathy      Plan:  Patient was evaluated and treated and all questions answered.  Patient educated on diabetes. Discussed proper diabetic foot care and discussed risks and complications of disease. Educated patient in depth on reasons to return to the office immediately should he/she discover anything concerning or new on the feet. All questions answered. Discussed proper shoes as well.    -XR reviewed with patient -Dressings applied consisting of Iodosorb and DSD -Continue offload ulcer with surgical shoe and peg assist device -Wound cleansed and debrided -Wound to been improving gradually, may now have a completely granular wound bed. -Would benefit from advancement of wound healing and faster time to wound closure with an advanced wound graft such as an amniotic layer. We will submit for authorization with his insurance to apply at next  visit  Procedure: Selective Debridement of Wound Rationale: Removal of devitalized tissue from the wound to promote healing.  Pre-Debridement Wound Measurements: 2.2 x 1.0 x 0.2 cm  Post-Debridement Wound Measurements: same as pre-debridement. Type of Debridement: sharp selective Tissue Removed: Devitalized soft-tissue Dressing: Dry, sterile, compression dressing. Disposition: Patient tolerated procedure well. Patient to return in 1 week for follow-up.   Return in about 3 weeks (around 09/02/2020).

## 2020-09-02 ENCOUNTER — Encounter: Payer: Self-pay | Admitting: Podiatry

## 2020-09-02 ENCOUNTER — Ambulatory Visit (INDEPENDENT_AMBULATORY_CARE_PROVIDER_SITE_OTHER): Payer: Medicare Other | Admitting: Podiatry

## 2020-09-02 ENCOUNTER — Other Ambulatory Visit: Payer: Self-pay

## 2020-09-02 DIAGNOSIS — E1142 Type 2 diabetes mellitus with diabetic polyneuropathy: Secondary | ICD-10-CM

## 2020-09-02 DIAGNOSIS — L97519 Non-pressure chronic ulcer of other part of right foot with unspecified severity: Secondary | ICD-10-CM

## 2020-09-02 DIAGNOSIS — E08621 Diabetes mellitus due to underlying condition with foot ulcer: Secondary | ICD-10-CM

## 2020-09-02 DIAGNOSIS — G629 Polyneuropathy, unspecified: Secondary | ICD-10-CM | POA: Diagnosis not present

## 2020-09-02 DIAGNOSIS — L97529 Non-pressure chronic ulcer of other part of left foot with unspecified severity: Secondary | ICD-10-CM | POA: Diagnosis not present

## 2020-09-02 DIAGNOSIS — M2042 Other hammer toe(s) (acquired), left foot: Secondary | ICD-10-CM

## 2020-09-02 DIAGNOSIS — M2041 Other hammer toe(s) (acquired), right foot: Secondary | ICD-10-CM | POA: Diagnosis not present

## 2020-09-02 MED ORDER — REGRANEX 0.01 % EX GEL: 1.0000 "application " | Freq: Every day | CUTANEOUS | 0 refills | Status: DC

## 2020-09-02 MED ORDER — MUPIROCIN 2 % EX OINT: 1.0000 "application " | TOPICAL_OINTMENT | Freq: Two times a day (BID) | CUTANEOUS | 2 refills | Status: DC

## 2020-09-04 NOTE — Progress Notes (Signed)
  Subjective:  Patient ID: William Brewer, male    DOB: [birth year],  MRN: [id number]  Chief Complaint  Patient presents with  . Diabetic Ulcer    'I think they are getting better, the left is doing better than the right"    74 y.o. male returns with the above complaint. History confirmed with patient.  Has been changing bandages with mupirocin daily at home.  Notes improvement in the left second toe and hallux.  Objective:  Physical Exam: warm, good capillary refill, normal DP and PT pulses and absent light touch and protective sensation bilateral plantar foot. Left Foot: Hallux plantar medial ulceration measuring 1.5 x 1.1 x 0.1 cm, partial-thickness and limited to skin, surrounding hyperkeratosis, second toe ulcer is healed Right Foot: Hallux plantar ulceration measuring 2.0 x 1.0 x 0.2 cm, full-thickness with exposure of subcutaneous tissue, fully granular base.    Assessment:   1. Bilateral diabetic foot ulcer associated with secondary diabetes mellitus ([place])   2. Type 2 diabetes mellitus with polyneuropathy (HCC)   3. Hammertoe of left foot   4. Neuropathy   5. Hammertoe of right foot      Plan:  Patient was evaluated and treated and all questions answered.  Patient educated on diabetes. Discussed proper diabetic foot care and discussed risks and complications of disease. Educated patient in depth on reasons to return to the office immediately should he/she discover anything concerning or new on the feet. All questions answered. Discussed proper shoes as well.    -XR reviewed with patient -Dressings applied consisting of Iodosorb and DSD -Continue offload ulcer with surgical shoe and peg assist device -Wound cleansed and debrided -Second digit left foot has completely healed now.  The hallux on this foot is also improving faster than the right side. -The right side appears to have stalled again would benefit from advancement of wound healing and faster time to wound  closure with an advanced wound product.  He was not covered by his insurance for the amniotic graft.  I prescribed with [facility] to see if he can get this as well. -Continue home wound care with bandaging and mupirocin ointment, refill was sent  Procedure: Selective Debridement of Wound Rationale: Removal of devitalized tissue from the wound to promote healing.  Pre-Debridement Wound Measurements: 2.0 x 1.0 x 0.2 cm  Post-Debridement Wound Measurements: same as pre-debridement. Type of Debridement: sharp selective Tissue Removed: Devitalized soft-tissue Dressing: Dry, sterile, compression dressing. Disposition: Patient tolerated procedure well. Patient to return in 1 week for follow-up.   Return in about 3 weeks (around 09/23/2020).

## 2020-09-08 ENCOUNTER — Other Ambulatory Visit: Payer: Self-pay | Admitting: Family Medicine

## 2020-09-08 NOTE — Telephone Encounter (Signed)
LR: 06-01-2020 Qty: 30 with 2 refills Last office visit: 07-20-2020 Upcoming appointment: 10-28-2020

## 2020-09-16 DIAGNOSIS — L570 Actinic keratosis: Secondary | ICD-10-CM | POA: Diagnosis not present

## 2020-09-17 ENCOUNTER — Other Ambulatory Visit: Payer: Self-pay | Admitting: Podiatry

## 2020-09-23 ENCOUNTER — Ambulatory Visit (INDEPENDENT_AMBULATORY_CARE_PROVIDER_SITE_OTHER): Payer: Medicare Other | Admitting: Podiatry

## 2020-09-23 ENCOUNTER — Other Ambulatory Visit: Payer: Self-pay

## 2020-09-23 DIAGNOSIS — E1142 Type 2 diabetes mellitus with diabetic polyneuropathy: Secondary | ICD-10-CM

## 2020-09-23 DIAGNOSIS — L97519 Non-pressure chronic ulcer of other part of right foot with unspecified severity: Secondary | ICD-10-CM

## 2020-09-23 DIAGNOSIS — G629 Polyneuropathy, unspecified: Secondary | ICD-10-CM | POA: Diagnosis not present

## 2020-09-23 DIAGNOSIS — M2042 Other hammer toe(s) (acquired), left foot: Secondary | ICD-10-CM

## 2020-09-23 DIAGNOSIS — E08621 Diabetes mellitus due to underlying condition with foot ulcer: Secondary | ICD-10-CM

## 2020-09-23 DIAGNOSIS — L97529 Non-pressure chronic ulcer of other part of left foot with unspecified severity: Secondary | ICD-10-CM | POA: Diagnosis not present

## 2020-09-23 NOTE — Progress Notes (Signed)
  Subjective:  Patient ID: William Brewer, male    DOB: September 20, 1946,  MRN: [id number]  Chief Complaint  Patient presents with  . Blister     f/u bil FT blisters    74 y.o. male returns with the above complaint. History confirmed with patient.  Has been changing bandages with mupirocin daily at home.  Thinks the left side has improved quite a bit  Objective:  Physical Exam: warm, good capillary refill, normal DP and PT pulses and absent light touch and protective sensation bilateral plantar foot. Left Foot: Hallux plantar medial ulceration measuring 0.7 x 0.3 x 0.1 cm, partial-thickness and limited to skin, surrounding hyperkeratosis, second toe ulcer is healed Right Foot: Hallux plantar ulceration measuring 1.2 x 0.8 x 0.2 cm, full-thickness with exposure of subcutaneous tissue, fully granular base.        Assessment:   1. Bilateral diabetic foot ulcer associated with secondary diabetes mellitus ([place])   2. Type 2 diabetes mellitus with polyneuropathy (HCC)   3. Hammertoe of left foot   4. Neuropathy      Plan:  Patient was evaluated and treated and all questions answered.  Patient educated on diabetes. Discussed proper diabetic foot care and discussed risks and complications of disease. Educated patient in depth on reasons to return to the office immediately should he/she discover anything concerning or new on the feet. All questions answered. Discussed proper shoes as well.    -XR reviewed with patient -Dressings applied consisting of Iodosorb and DSD -Continue offload ulcer with surgical shoe and peg assist device -Wound cleansed and debrided -He has made significant progress, approximately 50% reduction in size on the left hallux.  I am hopeful that the right hallux will also rapidly heal once the left side is fully healed and he can put more weight on this foot. -Continue home wound care with bandaging and mupirocin ointment, refill was sent  Procedure: Selective  Debridement of Wound Rationale: Removal of devitalized tissue from the wound to promote healing.  Pre-Debridement Wound Measurements: 0.7 x 0.3 x 0.1 cm on the left, 1.2 x 0.8 x 0.2 cm on the right Post-Debridement Wound Measurements: same as pre-debridement. Type of Debridement: sharp selective Tissue Removed: Devitalized soft-tissue Dressing: Dry, sterile, compression dressing. Disposition: Patient tolerated procedure well. Patient to return in 1 week for follow-up.   Return in about 3 weeks (around 10/14/2020).

## 2020-09-27 ENCOUNTER — Encounter: Payer: Self-pay | Admitting: Family Medicine

## 2020-10-10 ENCOUNTER — Other Ambulatory Visit: Payer: Self-pay | Admitting: Family Medicine

## 2020-10-11 NOTE — Telephone Encounter (Signed)
LR: 09-09-2020 Qty: 30 w 0 refills  Last office visit: 07-20-2020 Upcoming appointment: 10-28-2020

## 2020-10-12 ENCOUNTER — Other Ambulatory Visit: Payer: Self-pay | Admitting: Podiatry

## 2020-10-12 NOTE — Telephone Encounter (Signed)
Please advise 

## 2020-10-14 ENCOUNTER — Ambulatory Visit: Payer: Medicare Other | Admitting: Podiatry

## 2020-10-14 ENCOUNTER — Other Ambulatory Visit: Payer: Self-pay

## 2020-10-14 DIAGNOSIS — L97519 Non-pressure chronic ulcer of other part of right foot with unspecified severity: Secondary | ICD-10-CM

## 2020-10-14 DIAGNOSIS — G629 Polyneuropathy, unspecified: Secondary | ICD-10-CM

## 2020-10-14 DIAGNOSIS — L97529 Non-pressure chronic ulcer of other part of left foot with unspecified severity: Secondary | ICD-10-CM | POA: Diagnosis not present

## 2020-10-14 DIAGNOSIS — M2042 Other hammer toe(s) (acquired), left foot: Secondary | ICD-10-CM | POA: Diagnosis not present

## 2020-10-14 DIAGNOSIS — E08621 Diabetes mellitus due to underlying condition with foot ulcer: Secondary | ICD-10-CM | POA: Diagnosis not present

## 2020-10-14 DIAGNOSIS — E1142 Type 2 diabetes mellitus with diabetic polyneuropathy: Secondary | ICD-10-CM | POA: Diagnosis not present

## 2020-10-14 DIAGNOSIS — M2041 Other hammer toe(s) (acquired), right foot: Secondary | ICD-10-CM | POA: Diagnosis not present

## 2020-10-14 MED ORDER — MUPIROCIN 2 % EX OINT
TOPICAL_OINTMENT | CUTANEOUS | 2 refills | Status: DC
Start: 2020-10-14 — End: 2020-12-02

## 2020-10-14 NOTE — Progress Notes (Signed)
  Subjective:  Patient ID: [facility], male    DOB: 1946/12/14,  MRN: [id number]  Chief Complaint  Patient presents with  . Wound Check    Bilateral wound check Left hallux looks good its dried and the right hallux is still open and has some drainage. PT denies pain at this time     74 y.o. male returns with the above complaint. History confirmed with patient.  Has been changing bandages with mupirocin daily at home.    Objective:  Physical Exam: warm, good capillary refill, normal DP and PT pulses and absent light touch and protective sensation bilateral plantar foot. Left Foot: All ulcers have fully healed on the left foot with mild callus Right Foot: Hallux plantar ulceration measuring 1.0 x 1.2 x 0.1 cm, full-thickness with exposure of subcutaneous tissue, fully granular base.  No signs of infection.  Approximate 20 to 30% improvement since last visit       Assessment:   No diagnosis found.   Plan:  Patient was evaluated and treated and all questions answered.  Patient educated on diabetes. Discussed proper diabetic foot care and discussed risks and complications of disease. Educated patient in depth on reasons to return to the office immediately should he/she discover anything concerning or new on the feet. All questions answered. Discussed proper shoes as well.   -Dressings applied consisting of Iodosorb and DSD -Continue offload ulcer with surgical shoe and peg assist device on the right -Wound cleansed and debrided -Left side is fully healed.  He may resume regular bathing and apply a moisturizing lotion to this.  Advised him to return to regular shoe gear on the side and he may focus weight on the left side in order to offload the right -Continue home wound care with bandaging and mupirocin ointment, refill was sent  Procedure: Selective Debridement of Wound Rationale: Removal of devitalized tissue from the wound to promote healing.  Pre-Debridement Wound  Measurements:1.0 x 1.2 x 0.1 cm Post-Debridement Wound Measurements: same as pre-debridement. Type of Debridement: sharp selective Tissue Removed: Devitalized soft-tissue Dressing: Dry, sterile, compression dressing. Disposition: Patient tolerated procedure well. Patient to return in 3 week for follow-up.   No follow-ups on file.

## 2020-10-22 ENCOUNTER — Telehealth: Payer: Self-pay | Admitting: *Deleted

## 2020-10-22 NOTE — Telephone Encounter (Signed)
Called pt regarding Appt 10/25/2020 with Melina Copa, PA-C, which needs to be rescheduled, as provider schedule changed. Left a message for pt to call back.
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
## 2020-10-25 ENCOUNTER — Telehealth: Payer: Medicare Other | Admitting: Physician Assistant

## 2020-10-25 ENCOUNTER — Other Ambulatory Visit: Payer: Self-pay

## 2020-10-26 NOTE — Progress Notes (Signed)
Virtual Visit via Video Note   This visit type was conducted due to national recommendations for restrictions regarding the COVID-19 Pandemic (e.g. social distancing) in an effort to limit this patient's exposure and mitigate transmission in our community.  Due to his co-morbid illnesses, this patient is at least at moderate risk for complications without adequate follow up.  This format is felt to be most appropriate for this patient at this time.  All issues noted in this document were discussed and addressed.  A limited physical exam was performed with this format.  Please refer to the patient's chart for his consent to telehealth for [facility].       Date:  10/27/2020   ID:  [facility], [facility] 1946/10/18, MRN [id number] The patient was identified using 2 identifiers.  Patient Location: Home Provider Location: Home Office  PCP:  William Olp, MD  Cardiologist:  William Dawley, MD   Electrophysiologist:  None   Evaluation Performed:  Follow-Up Visit  Chief Complaint:  Follow-up (CAD)    Patient Profile: [facility] is a 74 y.o. male with:  Coronary artery disease   Non-obstructive by cath in 4/19  FHx of CAD   Hypertension   Hyperlipidemia   Diabetes mellitus   Peripheral neuropathy   Prior CV Studies: Cardiac catheterization April 22, 2018 LAD mid 25; D1 ost 75 LCx mid 40 RCA dist 50 EF 50-55  Myoview 03/20/18 EF 61, inf infarct with peri-infarct ischemia; high risk   History of Present Illness:   [facility] was last seen in clinic by William Bosch, PA-C in 5/19.  He is seen for follow up.  Overall, he has been doing well without chest discomfort, shortness of breath, syncope.  He has not had orthopnea, significant leg swelling.  Past Medical History:  Diagnosis Date  . Allergy   . Basal cell carcinoma    leg  . Colon polyps   . Diabetes mellitus without complication ([place])   . DIVERTICULOSIS, COLON 05/26/2008  . Hyperlipidemia    refused  treatment  . Hypertension   . Peripheral neuropathy    Past Surgical History:  Procedure Laterality Date  . APPENDECTOMY    . COLONOSCOPY    . LEFT HEART CATH AND CORONARY ANGIOGRAPHY N/A 04-22-18   Procedure: LEFT HEART CATH AND CORONARY ANGIOGRAPHY;  Surgeon: William Booze, MD;  Location: [facility];  Service: Cardiovascular;  Laterality: N/A;  . POLYPECTOMY    . ULTRASOUND GUIDANCE FOR VASCULAR ACCESS  04-22-18   Procedure: Ultrasound Guidance For Vascular Access;  Surgeon: William Booze, MD;  Location: [place] [facility];  Service: Cardiovascular;;     Current Meds  Medication Sig  . aspirin EC 81 MG tablet Take by mouth daily.  . becaplermin (REGRANEX) 0.01 % gel Apply 1 application topically daily. Apply daily to wound bed, cover with sterile gauze  . empagliflozin (JARDIANCE) 10 MG TABS tablet Take 10 mg by mouth daily before breakfast.  . gabapentin (NEURONTIN) 300 MG capsule Take 1 capsule three times a day  . lisinopril (ZESTRIL) 20 MG tablet Take 1 tablet (20 mg total) by mouth daily.  Marland Kitchen lovastatin (MEVACOR) 40 MG tablet TAKE 1 TABLET BY MOUTH AT BEDTIME  . Lysine HCl 1000 MG TABS Take 1,000 mg by mouth daily.   . Magnesium 400 MG TABS Take 500 mg by mouth daily.   . metFORMIN (GLUCOPHAGE) 1000 MG tablet Take 1 tablet (1,000 mg total) by mouth 2 (two) times  daily with a meal.  . mupirocin ointment (BACTROBAN) 2 % APPLY  OINTMENT TOPICALLY TO AFFECTED AREA TWICE DAILY  . zolpidem (AMBIEN) 5 MG tablet TAKE 1 TABLET BY MOUTH AT BEDTIME AS NEEDED FOR SLEEP     Allergies:   Trazodone and nefazodone   Social History   Tobacco Use  . Smoking status: Never Smoker  . Smokeless tobacco: Never Used  Vaping Use  . Vaping Use: Never used  Substance Use Topics  . Alcohol use: Yes    Alcohol/week: 5.0 - 6.0 standard drinks    Types: 3 Cans of beer, 2 - 3 Standard drinks or equivalent per week    Comment: 3 a week   . Drug use: No     Family Hx: The  patient's family history includes Alzheimer's disease in his maternal grandmother; Aortic aneurysm in his maternal grandfather; Breast cancer in his daughter; Cancer in his mother; Deep vein thrombosis in his mother; Heart disease (age of onset: 28) in his father. There is no history of Hypertension or Colon cancer. He was adopted.  ROS:   Please see the history of present illness.    Labs/Other Tests and Data Reviewed:    EKG:  An ECG dated 06/23/2020 was personally reviewed today and demonstrated:  Sinus tachycardia, HR 106, inferior Q waves, poor R wave progression, QTC 444, no ST-T wave changes  Recent Labs: 07/20/2020: ALT 26; BUN 25; Creat 1.12; Hemoglobin 14.8; Platelets 193; Potassium 4.0; Sodium 135   Recent Lipid Panel Lab Results  Component Value Date/Time   CHOL 125 03/02/2020 09:20 AM   TRIG 163.0 (H) 03/02/2020 09:20 AM   HDL 32.60 (L) 03/02/2020 09:20 AM   CHOLHDL 4 03/02/2020 09:20 AM   LDLCALC 59 03/02/2020 09:20 AM   LDLDIRECT 72 07/20/2020 12:06 PM    Wt Readings from Last 3 Encounters:  10/27/20 188 lb (85.3 kg)  07/20/20 181 lb 12.8 oz (82.5 kg)  06/25/20 186 lb (84.4 kg)     Risk Assessment/Calculations:      Objective:    Vital Signs:  Ht 6' (1.829 m)   Wt 188 lb (85.3 kg)   BMI 25.50 kg/m    VITAL SIGNS:  reviewed GEN:  no acute distress RESPIRATORY:  Normal respiratory effort PSYCH:  normal affect  ASSESSMENT & PLAN:    1. Coronary artery disease involving native coronary artery of native heart without angina pectoris We reviewed the findings of his cardiac catheterization from 2019.  He is currently doing well without anginal symptoms.  Continue aspirin, statin therapy.  Most recent LDL was 72.  We discussed the importance of maintaining LDL less than 70.  If his LDL continues to remain above 70, consider adding ezetimibe or switching to a more potent statin like rosuvastatin.  He is already on empagliflozin for diabetes.  We discussed the CV  benefit of SGLT2 inhibitors.  We discussed the importance of good blood pressure control.  It appears that his blood pressures are usually well controlled.  Follow-up with [facility] in 1 year.     Time:   Today, I have spent 8 minutes with the patient with telehealth technology discussing the above problems.     Medication Adjustments/Labs and Tests Ordered: Current medicines are reviewed at length with the patient today.  Concerns regarding medicines are outlined above.   Tests Ordered: No orders of the defined types were placed in this encounter.   Medication Changes: No orders of the defined types were placed  in this encounter.   Follow Up:  In Person in 1 year(s)  Signed, Richardson Dopp, PA-C  10/27/2020 4:57 PM    [facility]

## 2020-10-27 ENCOUNTER — Telehealth (INDEPENDENT_AMBULATORY_CARE_PROVIDER_SITE_OTHER): Payer: Medicare Other | Admitting: Physician Assistant

## 2020-10-27 ENCOUNTER — Telehealth: Payer: Self-pay | Admitting: Physician Assistant

## 2020-10-27 ENCOUNTER — Encounter: Payer: Self-pay | Admitting: Physician Assistant

## 2020-10-27 VITALS — Ht 72.0 in | Wt 188.0 lb

## 2020-10-27 DIAGNOSIS — I2510 Atherosclerotic heart disease of native coronary artery without angina pectoris: Secondary | ICD-10-CM

## 2020-10-27 DIAGNOSIS — I10 Essential (primary) hypertension: Secondary | ICD-10-CM

## 2020-10-27 DIAGNOSIS — E782 Mixed hyperlipidemia: Secondary | ICD-10-CM

## 2020-10-27 DIAGNOSIS — E1142 Type 2 diabetes mellitus with diabetic polyneuropathy: Secondary | ICD-10-CM

## 2020-10-27 NOTE — Telephone Encounter (Signed)
Patient confirmed that he looked over medications in my chart and Scott reviewed them with patient as well.

## 2020-10-27 NOTE — Patient Instructions (Signed)
Medication Instructions:  Your physician recommends that you continue on your current medications as directed. Please refer to the Current Medication list given to you today.  *If you need a refill on your cardiac medications before your next appointment, please call your pharmacy*  Lab Work: None ordered today  Testing/Procedures: None ordered today  Follow-Up: At [facility], you and your health needs are our priority.  As part of our continuing mission to provide you with exceptional heart care, we have created designated Provider Care Teams.  These Care Teams include your primary Cardiologist (physician) and Advanced Practice Providers (APPs -  Physician Assistants and Nurse Practitioners) who all work together to provide you with the care you need, when you need it.  Your next appointment:   12 month(s)  The format for your next appointment:   In Person  Provider:   Ena Dawley, MD

## 2020-10-27 NOTE — Telephone Encounter (Signed)
Follow up:     Patient calling  Back for his VV he missed the call.

## 2020-10-28 ENCOUNTER — Ambulatory Visit: Payer: Medicare Other | Admitting: Family Medicine

## 2020-10-28 DIAGNOSIS — D225 Melanocytic nevi of trunk: Secondary | ICD-10-CM | POA: Diagnosis not present

## 2020-10-28 DIAGNOSIS — L814 Other melanin hyperpigmentation: Secondary | ICD-10-CM | POA: Diagnosis not present

## 2020-10-28 DIAGNOSIS — L821 Other seborrheic keratosis: Secondary | ICD-10-CM | POA: Diagnosis not present

## 2020-10-28 DIAGNOSIS — L570 Actinic keratosis: Secondary | ICD-10-CM | POA: Diagnosis not present

## 2020-10-28 DIAGNOSIS — D1801 Hemangioma of skin and subcutaneous tissue: Secondary | ICD-10-CM | POA: Diagnosis not present

## 2020-11-03 ENCOUNTER — Other Ambulatory Visit: Payer: Self-pay | Admitting: Family Medicine

## 2020-11-11 ENCOUNTER — Ambulatory Visit: Payer: Medicare Other | Admitting: Podiatry

## 2020-11-11 ENCOUNTER — Other Ambulatory Visit: Payer: Self-pay

## 2020-11-11 ENCOUNTER — Encounter: Payer: Self-pay | Admitting: Podiatry

## 2020-11-11 DIAGNOSIS — L97511 Non-pressure chronic ulcer of other part of right foot limited to breakdown of skin: Secondary | ICD-10-CM

## 2020-11-11 DIAGNOSIS — M2041 Other hammer toe(s) (acquired), right foot: Secondary | ICD-10-CM

## 2020-11-11 DIAGNOSIS — E1142 Type 2 diabetes mellitus with diabetic polyneuropathy: Secondary | ICD-10-CM

## 2020-11-11 DIAGNOSIS — B351 Tinea unguium: Secondary | ICD-10-CM | POA: Diagnosis not present

## 2020-11-11 DIAGNOSIS — G629 Polyneuropathy, unspecified: Secondary | ICD-10-CM

## 2020-11-11 DIAGNOSIS — M79675 Pain in left toe(s): Secondary | ICD-10-CM | POA: Diagnosis not present

## 2020-11-11 DIAGNOSIS — Z8631 Personal history of diabetic foot ulcer: Secondary | ICD-10-CM

## 2020-11-11 DIAGNOSIS — M79674 Pain in right toe(s): Secondary | ICD-10-CM | POA: Diagnosis not present

## 2020-11-11 DIAGNOSIS — M2042 Other hammer toe(s) (acquired), left foot: Secondary | ICD-10-CM

## 2020-11-11 NOTE — Progress Notes (Signed)
  Subjective:  Patient ID: [facility], male    DOB: August 08, 1946,  MRN: [id number]  Chief Complaint  Patient presents with  . Blister      3-4wk f/u Bil Ft blisters    74 y.o. male returns with the above complaint. History confirmed with patient.  Thinks he is doing well, he has discolored toenails which he has noticed on the left hallux and second toe.  Objective:  Physical Exam: warm, good capillary refill, normal DP and PT pulses and absent light touch and protective sensation bilateral plantar foot.  Onychomycosis x10 with significant brown discoloration of the left hallux and second toe Left Foot: No recurrence of ulcers Right Foot: Hallux plantar ulceration measuring 0.8 x 0.4 x 0.1 cm, full-thickness with exposure of subcutaneous tissue, fully granular base.  Improved since last visit.  No signs of infection.           Assessment:   No diagnosis found.   Plan:  Patient was evaluated and treated and all questions answered.  Patient educated on diabetes. Discussed proper diabetic foot care and discussed risks and complications of disease. Educated patient in depth on reasons to return to the office immediately should he/she discover anything concerning or new on the feet. All questions answered. Discussed proper shoes as well.  Discussed the etiology and treatment options for the condition in detail with the patient. Educated patient on the topical and oral treatment options for mycotic nails. Recommended debridement of the nails today. Sharp and mechanical debridement performed of all painful and mycotic nails today. Nails debrided in length and thickness using a nail nipper and a mechanical burr to level of comfort. Discussed treatment options including appropriate shoe gear. Follow up as needed for painful nails.    -Dressings applied consisting of Iodosorb and DSD -Continue offload ulcer with surgical shoe and peg assist device on the right -Wound cleansed and  debrided -Left side remains healed, hopeful that the right side will heal much faster now -Continue home wound care with bandaging and mupirocin ointment  Procedure: Selective Debridement of Wound Rationale: Removal of devitalized tissue from the wound to promote healing.  Pre-Debridement Wound Measurements: 0.8 x 0.4 x 0.1 cm Post-Debridement Wound Measurements: same as pre-debridement. Type of Debridement: sharp selective Tissue Removed: Devitalized soft-tissue Dressing: Dry, sterile, compression dressing. Disposition: Patient tolerated procedure well. Patient to return in 3 week for follow-up.   Return in about 3 weeks (around 12/02/2020) for wound re-check.

## 2020-11-11 NOTE — Patient Instructions (Signed)
Monitor for any signs/symptoms of infection. Signs of an infection could be redness beyond the site of the incision/procedure/wound, foul smelling odor, drainage that is thick and yellow or green, or severe swelling and pain. Call the office immediately if any occur or go directly to the emergency room. Call with any questions/concerns.  

## 2020-12-01 NOTE — Progress Notes (Signed)
Phone [phone] In person visit   Subjective:   [facility] is a 74 y.o. year old very pleasant male patient who presents for/with See problem oriented charting Chief Complaint  Patient presents with  . Hyperlipidemia  . Hypertension  . Diabetes   This visit occurred during the SARS-CoV-2 public health emergency.  Safety protocols were in place, including screening questions prior to the visit, additional usage of staff PPE, and extensive cleaning of exam room while observing appropriate contact time as indicated for disinfecting solutions.   Past Medical History-  Patient Active Problem List   Diagnosis Date Noted  . CAD (coronary artery disease) 11/08/2018    Priority: High  . Diabetes mellitus type II, controlled ([place]) 09/23/2013    Priority: High  . Idiopathic peripheral neuropathy 01/27/2009    Priority: High  . Aortic atherosclerosis ([place]) 06/25/2020    Priority: Medium  . Insomnia 11/08/2018    Priority: Medium  . Right groin hernia 10/24/2016    Priority: Medium  . Essential hypertension 03/26/2008    Priority: Medium  . Hyperlipidemia 06/21/2007    Priority: Medium  . Basal cell carcinoma of skin 10/28/2015    Priority: Low  . Fever blister 08/10/2015    Priority: Low  . History of colonic polyps 06/21/2007    Priority: Low    Medications- reviewed and updated Current Outpatient Medications  Medication Sig Dispense Refill  . aspirin EC 81 MG tablet Take by mouth daily.    . becaplermin (REGRANEX) 0.01 % gel Apply 1 application topically daily. Apply daily to wound bed, cover with sterile gauze 15 g 0  . empagliflozin (JARDIANCE) 10 MG TABS tablet Take 10 mg by mouth daily before breakfast. 90 tablet 1  . lisinopril (ZESTRIL) 20 MG tablet Take 1 tablet (20 mg total) by mouth daily. 90 tablet 3  . lovastatin (MEVACOR) 40 MG tablet TAKE 1 TABLET BY MOUTH AT BEDTIME 90 tablet 0  . Lysine HCl 1000 MG TABS Take 1,000 mg by mouth daily.    . Magnesium 400  MG TABS Take 500 mg by mouth daily.     . metFORMIN (GLUCOPHAGE) 1000 MG tablet Take 1 tablet (1,000 mg total) by mouth 2 (two) times daily with a meal. 180 tablet 3  . zolpidem (AMBIEN) 5 MG tablet TAKE 1 TABLET BY MOUTH AT BEDTIME AS NEEDED FOR SLEEP 30 tablet 2  . amoxicillin-clavulanate (AUGMENTIN) 875-125 MG tablet Take 1 tablet by mouth 2 (two) times daily. (Patient not taking: No sig reported) 20 tablet 0  . gabapentin (NEURONTIN) 300 MG capsule Take 2 capsules three times a day. Max for his renal function. 540 capsule 3  . mupirocin ointment (BACTROBAN) 2 % APPLY  OINTMENT TOPICALLY TO AFFECTED AREA TWICE DAILY (Patient not taking: Reported on 12/02/2020) 30 g 2   No current facility-administered medications for this visit.     Objective:  BP 113/67   Pulse 87   Temp (!) 97.3 F (36.3 C) (Temporal)   Ht 6' (1.829 m)   Wt 183 lb (83 kg)   SpO2 99%   BMI 24.82 kg/m  Gen: NAD, resting comfortably CV: RRR no murmurs rubs or gallops Lungs: CTAB no crackles, wheeze, rhonchi Ext: no edema Skin: warm, dry Neuro:  Does not feel gross touch when I palpate pedal pulses (normal pulses)    Assessment and Plan   # Diabetes S: Medication:Jardiance 10 mg daily, Metformin 1000 mg twice daily CBGs- does not check Exercise and diet-  limited exercise but wants to build up if foot better Lab Results  Component Value Date   HGBA1C 7.6 (H) 07/20/2020   HGBA1C 8.5 (H) 03/02/2020   HGBA1C 8.8 (H) 10/16/2019   A/P: hopefully controlled- update a1c today  #CAD-nonobstructive based off catheterization 2019. No CP or SOB #aortic atherosclerosis-incidental finding on chest x-ray #hyperlipidemia S: Medication:Aspirin 81 mg, lovastatin 40 mg Lab Results  Component Value Date   CHOL 125 03/02/2020   HDL 32.60 (L) 03/02/2020   LDLCALC 59 03/02/2020   LDLDIRECT 72 07/20/2020   TRIG 163.0 (H) 03/02/2020   CHOLHDL 4 03/02/2020   A/P: CAD remains asymptomatic.Marland Kitchen  Aortic atherosclerosis likely  stable.  Hyperlipidemia reasonably controlled on lovastatin 40 mg with LDL very close to ideal goal of 70 or less at 72-focus on healthy eating/regular exercise (working on toe issue with podiatry and also has neuropathy issues) to bring down further and recheck next visit -also on jardiance  #hypertension S: medication: Lisinopril 20 mg.  We stopped hydrochlorothiazide 12.5 mg last visit due to orthostatic symptoms. Less orthostatic issues but still some 1-2x a month (but wonders if could be related to ex lax) Home readings #s: does not check BP Readings from Last 3 Encounters:  12/02/20 113/67  07/20/20 (!) 100/60  06/23/20 (!) 131/48  A/P: Stable. Continue current medications.  Could consider 10 mg lisinopril if more orthostatic issues   #Idiopathic neuropathy predating diabetes S: Compliant with gabapentin 300mg  3 times a day.  He has also been interested in the past and Horizant which is a longer acting but more expensive version of gabapentin 600 mg twice daily.  We also had discussed possible primary milligrams 3 times daily  He has been having increasing issues- numbness/stinging up to his knees and to his wrists- stabbing pains at night- has started supplementing. Debilitating. Proprioception issues not knowing where his foot or joint is in space. Has reached the point where he cannot drive sometime in last 6-9 months.   A/P: Poor control- increase gabapentin to 600mg  3 times a day.  - also willing to help with him getting hand controls with worsening issues.    Recommended follow up: Return in about 4 months (around 04/02/2021) for follow up- or sooner if needed. Future Appointments  Date Time Provider [place]  12/09/2020 11:15 AM McDonald, Stephan Minister, DPM [facility]   Lab/Order associations:   ICD-10-CM   1. Controlled type 2 diabetes mellitus with diabetic polyneuropathy, without long-term current use of insulin (HCC)  M84.13 COMPLETE METABOLIC PANEL WITH GFR     Hemoglobin A1c  2. Idiopathic peripheral neuropathy  G60.9   3. Coronary artery disease involving native coronary artery of native heart without angina pectoris  I25.10   4. Mixed hyperlipidemia  E78.2   5. Essential hypertension  I10   6. Aortic atherosclerosis (HCC)  I70.0    Meds ordered this encounter  Medications  . gabapentin (NEURONTIN) 300 MG capsule    Sig: Take 2 capsules three times a day. Max for his renal function.    Dispense:  540 capsule    Refill:  3   Return precautions advised.  Garret Reddish, MD

## 2020-12-01 NOTE — Patient Instructions (Addendum)
Health Maintenance Due  Topic Date Due  . INFLUENZA VACCINE - high dose flu shot 07/25/2020   Trial gabapentin 600mg  instead of 300 mg  Please stop by lab before you go If you have mychart- we will send your results within 3 business days of [place] receiving them.  If you do not have mychart- we will call you about results within 5 business days of [place] receiving them.  *please note we are currently using [facility] which has a longer processing time than [place] typically so labs may not come back as quickly as in the past *please also note that you will see labs on mychart as soon as they post. I will later go in and write notes on them- will say "notes from Dr. Yong Channel"

## 2020-12-02 ENCOUNTER — Other Ambulatory Visit: Payer: Self-pay

## 2020-12-02 ENCOUNTER — Encounter: Payer: Self-pay | Admitting: Family Medicine

## 2020-12-02 ENCOUNTER — Ambulatory Visit (INDEPENDENT_AMBULATORY_CARE_PROVIDER_SITE_OTHER): Payer: Medicare Other | Admitting: Family Medicine

## 2020-12-02 VITALS — BP 113/67 | HR 87 | Temp 97.3°F | Ht 72.0 in | Wt 183.0 lb

## 2020-12-02 DIAGNOSIS — I2510 Atherosclerotic heart disease of native coronary artery without angina pectoris: Secondary | ICD-10-CM | POA: Diagnosis not present

## 2020-12-02 DIAGNOSIS — E1142 Type 2 diabetes mellitus with diabetic polyneuropathy: Secondary | ICD-10-CM

## 2020-12-02 DIAGNOSIS — E782 Mixed hyperlipidemia: Secondary | ICD-10-CM | POA: Diagnosis not present

## 2020-12-02 DIAGNOSIS — G609 Hereditary and idiopathic neuropathy, unspecified: Secondary | ICD-10-CM | POA: Diagnosis not present

## 2020-12-02 DIAGNOSIS — I10 Essential (primary) hypertension: Secondary | ICD-10-CM

## 2020-12-02 DIAGNOSIS — Z23 Encounter for immunization: Secondary | ICD-10-CM | POA: Diagnosis not present

## 2020-12-02 DIAGNOSIS — I700 Atherosclerosis of aorta: Secondary | ICD-10-CM

## 2020-12-02 MED ORDER — GABAPENTIN 300 MG PO CAPS
ORAL_CAPSULE | ORAL | 3 refills | Status: DC
Start: 2020-12-02 — End: 2021-11-28

## 2020-12-03 LAB — COMPLETE METABOLIC PANEL WITH GFR
AG Ratio: 2 (calc) (ref 1.0–2.5)
ALT: 31 U/L (ref 9–46)
AST: 29 U/L (ref 10–35)
Albumin: 4.7 g/dL (ref 3.6–5.1)
Alkaline phosphatase (APISO): 52 U/L (ref 35–144)
BUN/Creatinine Ratio: 30 (calc) — ABNORMAL HIGH (ref 6–22)
BUN: 29 mg/dL — ABNORMAL HIGH (ref 7–25)
CO2: 22 mmol/L (ref 20–32)
Calcium: 10.1 mg/dL (ref 8.6–10.3)
Chloride: 105 mmol/L (ref 98–110)
Creat: 0.97 mg/dL (ref 0.70–1.18)
GFR, Est African American: 89 mL/min/{1.73_m2} (ref >=60)
GFR, Est Non African American: 77 mL/min/{1.73_m2} (ref >=60)
Globulin: 2.4 g/dL (calc) (ref 1.9–3.7)
Glucose, Bld: 104 mg/dL — ABNORMAL HIGH (ref 65–99)
Potassium: 4.5 mmol/L (ref 3.5–5.3)
Sodium: 140 mmol/L (ref 135–146)
Total Bilirubin: 0.6 mg/dL (ref 0.2–1.2)
Total Protein: 7.1 g/dL (ref 6.1–8.1)

## 2020-12-03 LAB — HEMOGLOBIN A1C
Hgb A1c MFr Bld: 6.9 % of total Hgb — ABNORMAL HIGH (ref <5.7)
Mean Plasma Glucose: 151 mg/dL
eAG (mmol/L): 8.4 mmol/L

## 2020-12-09 ENCOUNTER — Ambulatory Visit: Payer: Medicare Other | Admitting: Podiatry

## 2020-12-09 ENCOUNTER — Other Ambulatory Visit: Payer: Self-pay

## 2020-12-09 DIAGNOSIS — L97511 Non-pressure chronic ulcer of other part of right foot limited to breakdown of skin: Secondary | ICD-10-CM | POA: Diagnosis not present

## 2020-12-09 DIAGNOSIS — M2041 Other hammer toe(s) (acquired), right foot: Secondary | ICD-10-CM

## 2020-12-09 DIAGNOSIS — E1142 Type 2 diabetes mellitus with diabetic polyneuropathy: Secondary | ICD-10-CM

## 2020-12-11 ENCOUNTER — Encounter: Payer: Self-pay | Admitting: Podiatry

## 2020-12-11 NOTE — Progress Notes (Signed)
  Subjective:  Patient ID: William Brewer, male    DOB: 1946/01/11,  MRN: [id number]  No chief complaint on file.   74 y.o. male returns with the above complaint. History confirmed with patient.  Thinks he is doing well, he has discolored toenails which he has noticed on the left hallux and second toe.  Objective:  Physical Exam: warm, good capillary refill, normal DP and PT pulses and absent light touch and protective sensation bilateral plantar foot.  Onychomycosis x10 with significant brown discoloration of the left hallux and second toe Left Foot: No recurrence of ulcers Right Foot: Hallux plantar ulceration measuring 0.6 x 0.3 x 0.1 cm, full-thickness with exposure of subcutaneous tissue, fully granular base.  Improved since last visit.  No signs of infection.           Assessment:   1. Type 2 diabetes mellitus with polyneuropathy ([place])   2. Hammertoe of right foot   3. Chronic ulcer of great toe of right foot, limited to breakdown of skin ([place])      Plan:  Patient was evaluated and treated and all questions answered.  Patient educated on diabetes. Discussed proper diabetic foot care and discussed risks and complications of disease. Educated patient in depth on reasons to return to the office immediately should he/she discover anything concerning or new on the feet. All questions answered. Discussed proper shoes as well.    -Dressings applied consisting of Iodosorb and DSD -Continue offload ulcer with surgical shoe and peg assist device on the right -Wound cleansed and debrided -Left side remains healed, hopeful that the right side will heal much faster now -Continue home wound care with bandaging and mupirocin ointment  Procedure: Selective Debridement of Wound Rationale: Removal of devitalized tissue from the wound to promote healing.  Pre-Debridement Wound Measurements: 0.6 x 0.3 x 0.1 cm Post-Debridement Wound Measurements: same as pre-debridement. Type of  Debridement: sharp selective Tissue Removed: Devitalized soft-tissue Dressing: Dry, sterile, compression dressing. Disposition: Patient tolerated procedure well. Patient to return in 3 week for follow-up.   Return in about 3 weeks (around 12/30/2020) for wound re-check.

## 2020-12-28 ENCOUNTER — Telehealth: Payer: Self-pay

## 2020-12-28 NOTE — Telephone Encounter (Signed)
See below

## 2020-12-28 NOTE — Telephone Encounter (Signed)
I did a search through my email and have no email from [facility] that I can find. Can they fax it instead?

## 2020-12-28 NOTE — Telephone Encounter (Signed)
Pt called stating Dr. Durene Cal should have received an email from [employer] at [employer]. Pt asked if Dr. Durene Cal could fill out medical report form and email it back to [employer]. Please advise.

## 2020-12-29 NOTE — Telephone Encounter (Signed)
Called and lm for pt tcb. 

## 2020-12-30 NOTE — Telephone Encounter (Signed)
Paperwork has been placed in William Brewer's box to sign. °

## 2021-01-03 ENCOUNTER — Telehealth: Payer: Self-pay

## 2021-01-03 NOTE — Telephone Encounter (Signed)
Patient called in saying he returning a call from [place].

## 2021-01-04 NOTE — Telephone Encounter (Signed)
Attempted to return patients call, No answer, [facility]

## 2021-01-06 ENCOUNTER — Ambulatory Visit: Payer: Medicare Other | Admitting: Podiatry

## 2021-01-08 ENCOUNTER — Other Ambulatory Visit: Payer: Self-pay | Admitting: Family Medicine

## 2021-01-11 ENCOUNTER — Ambulatory Visit: Payer: Medicare Other | Admitting: Podiatry

## 2021-01-12 ENCOUNTER — Telehealth: Payer: Self-pay

## 2021-01-12 NOTE — Telephone Encounter (Signed)
Pt following up on DMV paperwork he dropeed off. He is wanting to know if he can come pick it up. I did not see it upfront.

## 2021-01-12 NOTE — Telephone Encounter (Signed)
Spoke with patient, He is aware that his paper work is available for pickup

## 2021-01-15 ENCOUNTER — Other Ambulatory Visit: Payer: Self-pay | Admitting: Family Medicine

## 2021-01-20 ENCOUNTER — Ambulatory Visit: Payer: Medicare Other | Admitting: Podiatry

## 2021-01-20 ENCOUNTER — Other Ambulatory Visit: Payer: Self-pay

## 2021-01-20 DIAGNOSIS — E1142 Type 2 diabetes mellitus with diabetic polyneuropathy: Secondary | ICD-10-CM

## 2021-01-20 DIAGNOSIS — S90414A Abrasion, right lesser toe(s), initial encounter: Secondary | ICD-10-CM | POA: Diagnosis not present

## 2021-01-20 DIAGNOSIS — L97511 Non-pressure chronic ulcer of other part of right foot limited to breakdown of skin: Secondary | ICD-10-CM | POA: Diagnosis not present

## 2021-01-23 ENCOUNTER — Encounter: Payer: Self-pay | Admitting: Podiatry

## 2021-01-23 NOTE — Progress Notes (Signed)
  Subjective:  Patient ID: William Brewer, male    DOB: 10/31/1946,  MRN: [id number]  Chief Complaint  Patient presents with  . Wound Check    Right hallux wound check. PT is concerned about the redness on the top of the toe     75 y.o. male returns with the above complaint. History confirmed with patient.  Thinks he is doing well,notes improvement again. Has a new rubbing over the hallux  Objective:  Physical Exam: warm, good capillary refill, normal DP and PT pulses and absent light touch and protective sensation bilateral plantar foot.  Onychomycosis x10 with significant brown discoloration of the left hallux and second toe Left Foot: No recurrence of ulcers Right Foot: Hallux plantar ulceration measuring 0.9 x 0.2 x 0.1 cm, full-thickness with exposure of subcutaneous tissue, fully granular base. Some increase in size since last visit. Dorsal hallux with abrasion             Assessment:   No diagnosis found.   Plan:  Patient was evaluated and treated and all questions answered.  Patient educated on diabetes. Discussed proper diabetic foot care and discussed risks and complications of disease. Educated patient in depth on reasons to return to the office immediately should he/she discover anything concerning or new on the feet. All questions answered. Discussed proper shoes as well.  He has a new abrasion/excoriation on the dorsal R hallux. Apply mupirocin daily. Did not require debridement. Expect this willl heal   -Dressings applied consisting of Iodosorb and DSD -Continue offload ulcer with surgical shoe and peg assist device on the right -Wound cleansed and debrided -Left side remains healed, hopeful that the right side will heal much faster now -Continue home wound care with bandaging and mupirocin ointment  Procedure: Selective Debridement of Wound Rationale: Removal of devitalized tissue from the wound to promote healing.  Pre-Debridement Wound Measurements:  0.9 x 0.2 x 0.1 cm Post-Debridement Wound Measurements: same as pre-debridement. Type of Debridement: sharp selective Tissue Removed: Devitalized soft-tissue Dressing: Dry, sterile, compression dressing. Disposition: Patient tolerated procedure well. Patient to return in 3 week for follow-up.   No follow-ups on file.

## 2021-02-07 ENCOUNTER — Other Ambulatory Visit: Payer: Self-pay

## 2021-02-07 ENCOUNTER — Other Ambulatory Visit: Payer: Self-pay | Admitting: Family Medicine

## 2021-02-07 ENCOUNTER — Ambulatory Visit: Payer: Medicare Other | Admitting: Podiatry

## 2021-02-07 ENCOUNTER — Encounter: Payer: Self-pay | Admitting: Podiatry

## 2021-02-07 DIAGNOSIS — L97511 Non-pressure chronic ulcer of other part of right foot limited to breakdown of skin: Secondary | ICD-10-CM

## 2021-02-07 DIAGNOSIS — E1142 Type 2 diabetes mellitus with diabetic polyneuropathy: Secondary | ICD-10-CM | POA: Diagnosis not present

## 2021-02-07 NOTE — Progress Notes (Signed)
  Subjective:  Patient ID: William Brewer, male    DOB: Aug 13, 1946,  MRN: [id number]  Chief Complaint  Patient presents with  . Wound Check    Right hallux. PT stated that he is doing okay he has no major concerns at this time.    75 y.o. male returns with the above complaint. History confirmed with patient.  Spot on the hallux has healed  Objective:  Physical Exam: warm, good capillary refill, normal DP and PT pulses and absent light touch and protective sensation bilateral plantar foot.  Onychomycosis x10 with significant brown discoloration of the left hallux and second toe Left Foot: No recurrence of ulcers Right Foot: Hallux plantar ulceration measuring 1.1 x 0.5 x 0.1 cm, full-thickness with exposure of subcutaneous tissue, fully granular base. Some increase in size since last visit. Dorsal hallux abrasion has healed fully               Assessment:   1. Type 2 diabetes mellitus with polyneuropathy ([place])   2. Chronic ulcer of great toe of right foot, limited to breakdown of skin ([place])      Plan:  Patient was evaluated and treated and all questions answered.  Patient educated on diabetes. Discussed proper diabetic foot care and discussed risks and complications of disease. Educated patient in depth on reasons to return to the office immediately should he/she discover anything concerning or new on the feet. All questions answered. Discussed proper shoes as well.  -Dressings applied consisting of Promogran Prisma and DSD -Wound cleansed and debrided -Left side remains healed -Continue home wound care, and would like him to begin to apply Promogran Prisma every 72 hours  Procedure: Selective Debridement of Wound Rationale: Removal of devitalized tissue from the wound to promote healing.  Pre-Debridement Wound Measurements: 1.1 x 0.5 x 0.1 Post-Debridement Wound Measurements: same as pre-debridement. Type of Debridement: sharp selective Tissue Removed: Devitalized  soft-tissue Dressing: Dry, sterile, compression dressing. Disposition: Patient tolerated procedure well. Patient to return in 3 week for follow-up.   Return in about 3 weeks (around 02/28/2021).

## 2021-02-07 NOTE — Patient Instructions (Signed)
Change the dressing at least every other day with mupirocin ointment  Every 3 days, add more collagen to the wound. Do not remove or scrape off the old collagen. You may shower the foot before the collagen application on the third day if you immediately change the dressing after with new collagen

## 2021-02-16 ENCOUNTER — Other Ambulatory Visit: Payer: Self-pay | Admitting: Family Medicine

## 2021-02-21 ENCOUNTER — Telehealth: Payer: Self-pay

## 2021-02-21 NOTE — Telephone Encounter (Signed)
..   LAST APPOINTMENT DATE: 02/16/2021   NEXT APPOINTMENT DATE:@3 /03/2021  MEDICATION:VALTREX) 1000 MG tablet

## 2021-02-22 ENCOUNTER — Other Ambulatory Visit: Payer: Self-pay

## 2021-02-22 MED ORDER — VALACYCLOVIR HCL 1 G PO TABS: ORAL_TABLET | ORAL | 3 refills | Status: DC

## 2021-02-22 NOTE — Telephone Encounter (Signed)
May refill Valtrex as I last prescribed it

## 2021-02-22 NOTE — Telephone Encounter (Signed)
Not on current med list, ok to fill?

## 2021-02-22 NOTE — Telephone Encounter (Signed)
Rx refilled.

## 2021-02-25 ENCOUNTER — Ambulatory Visit (INDEPENDENT_AMBULATORY_CARE_PROVIDER_SITE_OTHER): Payer: Medicare Other

## 2021-02-25 DIAGNOSIS — Z0000 Encounter for general adult medical examination without abnormal findings: Secondary | ICD-10-CM | POA: Diagnosis not present

## 2021-02-25 NOTE — Progress Notes (Signed)
Virtual Visit via Telephone Note  I connected with  William Brewer on 02/25/21 at  1:45 PM EST by telephone and verified that I am speaking with the correct person using two identifiers.  Medicare Annual Wellness visit completed telephonically due to Covid-19 pandemic.   Persons participating in this call: This Health Coach and this patient.   Location: Patient: Home Provider: Office    I discussed the limitations, risks, security and privacy concerns of performing an evaluation and management service by telephone and the availability of in person appointments. The patient expressed understanding and agreed to proceed.  Unable to perform video visit due to video visit attempted and failed and/or patient does not have video capability.   Some vital signs may be absent or patient reported.   Willette Brace, LPN    Subjective:   William Brewer is a 75 y.o. male who presents for Medicare Annual/Subsequent preventive examination.  Review of Systems     Cardiac Risk Factors include: advanced age (>72men, >23 women);diabetes mellitus;male gender;hypertension;dyslipidemia     Objective:    Today's Vitals   02/25/21 1342  PainSc: 6    There is no height or weight on file to calculate BMI.  Advanced Directives 02/25/2021 06/23/2020 01/15/2020 01/08/2019 03/26/2018 03/15/2017  Does Patient Have a Medical Advance Directive? No No No No No Yes  Would patient like information on creating a medical advance directive? No - Patient declined - Yes (MAU/Ambulatory/Procedural Areas - Information given) Yes (MAU/Ambulatory/Procedural Areas - Information given) No - Patient declined -    Current Medications (verified) Outpatient Encounter Medications as of 02/25/2021  Medication Sig  . aspirin EC 81 MG tablet Take by mouth daily.  . becaplermin (REGRANEX) 0.01 % gel Apply 1 application topically daily. Apply daily to wound bed, cover with sterile gauze  . empagliflozin (JARDIANCE) 10 MG TABS  tablet Take 10 mg by mouth daily before breakfast.  . gabapentin (NEURONTIN) 300 MG capsule Take 2 capsules three times a day. Max for his renal function.  Marland Kitchen lisinopril (ZESTRIL) 20 MG tablet Take 1 tablet (20 mg total) by mouth daily.  Marland Kitchen lovastatin (MEVACOR) 40 MG tablet TAKE 1 TABLET BY MOUTH AT BEDTIME  . Lysine HCl 1000 MG TABS Take 1,000 mg by mouth daily.  . Magnesium 400 MG TABS Take 250 mg by mouth daily.  . metFORMIN (GLUCOPHAGE) 1000 MG tablet TAKE 1 TABLET BY MOUTH TWICE DAILY WITH MEALS  . valACYclovir (VALTREX) 1000 MG tablet TAKE TWO TABLETS BY MOUTH TWICE DAILY FOR 1 DAY AS NEEDED  . zolpidem (AMBIEN) 5 MG tablet TAKE 1 TABLET BY MOUTH AT BEDTIME AS NEEDED FOR SLEEP   No facility-administered encounter medications on file as of 02/25/2021.    Allergies (verified) Patient has no active allergies.   History: Past Medical History:  Diagnosis Date  . Allergy   . Basal cell carcinoma    leg  . Colon polyps   . Diabetes mellitus without complication ([place])   . DIVERTICULOSIS, COLON 05/26/2008  . Hyperlipidemia    refused treatment  . Hypertension   . Peripheral neuropathy    Past Surgical History:  Procedure Laterality Date  . APPENDECTOMY    . COLONOSCOPY    . LEFT HEART CATH AND CORONARY ANGIOGRAPHY N/A 03/26/2018   Procedure: LEFT HEART CATH AND CORONARY ANGIOGRAPHY;  Surgeon: Jettie Booze, MD;  Location: [facility];  Service: Cardiovascular;  Laterality: N/A;  . POLYPECTOMY    . ULTRASOUND GUIDANCE  FOR VASCULAR ACCESS  03/26/2018   Procedure: Ultrasound Guidance For Vascular Access;  Surgeon: Jettie Booze, MD;  Location: [facility];  Service: Cardiovascular;;   Family History  Adopted: Yes  Problem Relation Age of Onset  . Deep vein thrombosis Mother        cancer  . Cancer Mother        intestine  . Heart disease Father 38       CABG, former smoker but not heavy  . Breast cancer Daughter   . Alzheimer's disease Maternal  Grandmother   . Aortic aneurysm Maternal Grandfather   . Hypertension Neg Hx   . Colon cancer Neg Hx    Social History   Socioeconomic History  . Marital status: Married    Spouse name: Not on file  . Number of children: Not on file  . Years of education: Not on file  . Highest education level: Not on file  Occupational History  . Not on file  Tobacco Use  . Smoking status: Never Smoker  . Smokeless tobacco: Never Used  Vaping Use  . Vaping Use: Never used  Substance and Sexual Activity  . Alcohol use: Yes    Alcohol/week: 5.0 - 6.0 standard drinks    Types: 3 Cans of beer, 2 - 3 Standard drinks or equivalent per week    Comment: 3 a week   . Drug use: No  . Sexual activity: Yes  Other Topics Concern  . Not on file  Social History Narrative   Married 1973. 2 children. 2 grandkids. Son in [place]. Daughter 5 minutes away.       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 Difficulty of Paying Living Expenses: Not hard at all  Food Insecurity: No Food Insecurity  . Worried About Charity fundraiser in the Last Year: Never true  . Ran Out of Food in the Last Year: Never true  Transportation Needs: No Transportation Needs  . Lack of Transportation (Medical): No  . Lack of Transportation (Non-Medical): No  Physical Activity: Inactive  . Days of Exercise per Week: 0 days  . Minutes of Exercise per Session: 0 min  Stress: No Stress Concern Present  . Feeling of Stress : Not at all  Social Connections: Moderately Integrated  . Frequency of Communication with Friends and Family: More than three times a week  . Frequency of Social Gatherings with Friends and Family: Once a week  . Attends Religious Services: Never  . Active Member of Clubs or  Organizations: Yes  . Attends Archivist Meetings: 1 to 4 times per year  . Marital Status: Married    Tobacco Counseling Counseling given: Not Answered   Clinical Intake:  Pre-visit preparation completed: Yes  Pain : 0-10 Pain Score: 6  Pain Type: Neuropathic pain Pain Location: Generalized Pain Descriptors / Indicators: Numbness,Sharp,Tingling Pain Onset: More than a month ago Pain Frequency: Constant     BMI - recorded: 24.82 Nutritional Status: BMI of 19-24  Normal Diabetes: Yes CBG done?: No Did pt. bring in CBG monitor from home?: No  How often do you need to have someone help you when you read instructions, pamphlets, or other written materials from your doctor or pharmacy?: 1 - Never  Diabetic?Nutrition Risk Assessment:  Has the patient had any N/V/D within the last 2 months?  No  Does the patient have any non-healing wounds?  Yes, toe is still healing  Has the patient had any unintentional weight loss or weight gain?  No   Diabetes:  Is the patient diabetic?  Yes  If diabetic, was a CBG obtained today?  No  Did the patient bring in their glucometer from home?  No  How often do you monitor your CBG's? .   Financial Strains and Diabetes Management:  Are you having any financial strains with the device, your supplies or your medication? No .  Does the patient want to be seen by Chronic Care Management for management of their diabetes?  No  Would the patient like to be referred to a Nutritionist or for Diabetic Management?  No   Diabetic Exams:  Diabetic Eye Exam: Completed 03/30/20 Diabetic Foot Exam: Completed 03/02/20   Interpreter Needed?: No  Information entered by :: Charlott Rakes, LPN   Activities of Daily Living In your present state of health, do you have any difficulty performing the following activities: 02/25/2021  Hearing? N  Vision? N  Walking or climbing stairs? Y  Comment slower  Dressing or bathing? N  Doing errands,  shopping? N  Preparing Food and eating ? N  Using the Toilet? N  Managing your Medications? N  Managing your Finances? N  Housekeeping or managing your Housekeeping? N  Some recent data might be hidden    Patient Care Team: Marin Olp, MD as PCP - General (Family Medicine) Dorothy Spark, MD as PCP - Cardiology (Cardiology) Juluis Rainier as Consulting Physician (Optometry) Regal, Tamala Fothergill, DPM as Consulting Physician (Podiatry)  Indicate any recent Medical Services you may have received from other than [facility] providers in the past year (date may be approximate).     Assessment:   This is a routine wellness examination for William Brewer.  Hearing/Vision screen  Hearing Screening   125Hz  250Hz  500Hz  1000Hz  2000Hz  3000Hz  4000Hz  6000Hz  8000Hz   Right ear:           Left ear:           Comments: Pt denies any hearing issues   Vision Screening Comments: Pt follows up with [facility] for annual eye exams   Dietary issues and exercise activities discussed: Current Exercise Habits: The patient does not participate in regular exercise at present  Goals    . patient     Maintain your health!    . Patient Stated     Lose weight       Depression Screen PHQ 2/9 Scores 02/25/2021 07/20/2020 06/25/2020 03/02/2020 01/15/2020 10/16/2019 01/08/2019  PHQ - 2 Score 0 0 0 0 0 1 0  PHQ- 9 Score - 0 0 0 - 1 -    Fall Risk Fall Risk  02/25/2021 01/15/2020 01/08/2019 03/15/2017 03/15/2017  Falls in the past year? 0 0 0 No No  Number falls in past yr: 0 0 - - -  Injury with Fall? 0 0 - - -  Risk for fall due to : Impaired vision;Impaired balance/gait - - - -  Follow up Falls prevention discussed Falls evaluation completed;Education provided;Falls prevention discussed - - -    FALL RISK PREVENTION PERTAINING TO THE HOME:  Any stairs in or around the home? Yes  If so, are there any without handrails? No  Home free of loose throw rugs in walkways, pet beds,  electrical cords, etc? Yes   Adequate lighting in your home to reduce risk of falls? Yes   ASSISTIVE DEVICES UTILIZED TO PREVENT FALLS:  Life alert? No  Use of a cane, walker or w/c? No  Grab bars in the bathroom? Yes  Shower chair or bench in shower? No  Elevated toilet seat or a handicapped toilet? Yes   TIMED UP AND GO:  Was the test performed? No .      Cognitive Function: MMSE - Mini Mental State Exam 01/08/2019  Orientation to time 5  Orientation to Place 5  Registration 3  Attention/ Calculation 5  Recall 3  Language- name 2 objects 2  Language- repeat 1  Language- follow 3 step command 3  Language- read & follow direction 1  Write a sentence 1  Copy design 1  Total score 30     6CIT Screen 02/25/2021 01/15/2020 03/15/2017  What Year? 0 points 0 points (No Data)  What month? 0 points 0 points -  What time? - 0 points -  Count back from 20 0 points 0 points -  Months in reverse 0 points 0 points -  Repeat phrase 0 points 0 points -  Total Score - 0 -    Immunizations Immunization History  Administered Date(s) Administered  . Fluad Quad(high Dose 65+) 10/16/2019, 12/02/2020  . Influenza, High Dose Seasonal PF 10/24/2016, 02/12/2018, 11/08/2018  . Influenza,inj,Quad PF,6+ Mos 09/23/2013, 11/27/2014, 10/28/2015  . PFIZER(Purple Top)SARS-COV-2 Vaccination 01/30/2020, 02/24/2020, 10/02/2020  . Pneumococcal Conjugate-13 04/29/2015  . Pneumococcal Polysaccharide-23 09/20/2012  . Td 05/26/2008  . Tdap 05/26/2008, 01/17/2020  . Zoster 09/20/2012    TDAP status: Up to date  Flu Vaccine status: Up to date  Pneumococcal vaccine status: Up to date  Covid-19 vaccine status: Completed vaccines  Qualifies for Shingles Vaccine? Yes   Zostavax completed Yes   Shingrix Completed?: No.    Education has been provided regarding the importance of this vaccine. Patient has been advised to call insurance company to determine out of pocket expense if they have not yet received this vaccine. Advised  may also receive vaccine at local pharmacy or Health Dept. Verbalized acceptance and understanding.  Screening Tests Health Maintenance  Topic Date Due  . FOOT EXAM  03/02/2021  . OPHTHALMOLOGY EXAM  03/30/2021  . COVID-19 Vaccine (4 - Booster for Pfizer series) 04/02/2021  . HEMOGLOBIN A1C  06/02/2021  . COLONOSCOPY (Pts 45-2yrs Insurance coverage will need to be confirmed)  08/30/2026  . TETANUS/TDAP  01/16/2030  . INFLUENZA VACCINE  Completed  . Hepatitis C Screening  Completed  . PNA vac Low Risk Adult  Completed  . HPV VACCINES  Aged Out    Health Maintenance  There are no preventive care reminders to display for this patient.  Colorectal cancer screening: Type of screening: Colonoscopy. Completed 08/30/16. Repeat every 10 years   Additional Screening:  Hepatitis C Screening:  Completed 01/27/09  Vision Screening: Recommended annual ophthalmology exams for early detection of glaucoma and other disorders of the eye. Is the patient up to date with their annual eye exam?  Yes  Who is the provider or what is the name of the office in which the patient attends annual eye exams? [facility]  If pt is not established with a provider, would they like to be referred to a provider to establish care? No .   Dental Screening: Recommended annual dental exams for proper oral hygiene  Community Resource Referral / Chronic Care Management: CRR  required this visit?  No   CCM required this visit?  No      Plan:     I have personally reviewed and noted the following in the patient's chart:   . Medical and social history . Use of alcohol, tobacco or illicit drugs  . Current medications and supplements . Functional ability and status . Nutritional status . Physical activity . Advanced directives . List of other physicians . Hospitalizations, surgeries, and ER visits in previous 12 months . Vitals . Screenings to include cognitive, depression, and falls . Referrals and  appointments  In addition, I have reviewed and discussed with patient certain preventive protocols, quality metrics, and best practice recommendations. A written personalized care plan for preventive services as well as general preventive health recommendations were provided to patient.     Willette Brace, LPN   07/25/253   Nurse Notes: None

## 2021-02-25 NOTE — Patient Instructions (Addendum)
William Brewer , Thank you for taking time to come for your Medicare Wellness Visit. I appreciate your ongoing commitment to your health goals. Please review the following plan we discussed and let me know if I can assist you in the future.   Screening recommendations/referrals: Colonoscopy: Done 08/30/16 Recommended yearly ophthalmology/optometry visit for glaucoma screening and checkup Recommended yearly dental visit for hygiene and checkup  Vaccinations: Influenza vaccine: Done 12/02/20 Pneumococcal vaccine: Up to date Tdap vaccine: Up to date Shingles vaccine: Shingrix discussed. Please contact your pharmacy for coverage information.    Covid-19: Competed 2/5, 3/2, & 10/02/20  Advanced directives: Advance directive discussed with you today. Even though you declined this today please call our office should you change your mind and we can give you the proper paperwork for you to fill out.  Conditions/risks identified: Lose weight   Next appointment: Follow up in one year for your annual wellness visit.   Preventive Care 75 Years and Older, Male Preventive care refers to lifestyle choices and visits with your health care provider that can promote health and wellness. What does preventive care include?  A yearly physical exam. This is also called an annual well check.  Dental exams once or twice a year.  Routine eye exams. Ask your health care provider how often you should have your eyes checked.  Personal lifestyle choices, including:  Daily care of your teeth and gums.  Regular physical activity.  Eating a healthy diet.  Avoiding tobacco and drug use.  Limiting alcohol use.  Practicing safe sex.  Taking low doses of aspirin every day.  Taking vitamin and mineral supplements as recommended by your health care provider. What happens during an annual well check? The services and screenings done by your health care provider during your annual well check will depend on your  age, overall health, lifestyle risk factors, and family history of disease. Counseling  Your health care provider may ask you questions about your:  Alcohol use.  Tobacco use.  Drug use.  Emotional well-being.  Home and relationship well-being.  Sexual activity.  Eating habits.  History of falls.  Memory and ability to understand (cognition).  Work and work Statistician. Screening  You may have the following tests or measurements:  Height, weight, and BMI.  Blood pressure.  Lipid and cholesterol levels. These may be checked every 5 years, or more frequently if you are over 68 years old.  Skin check.  Lung cancer screening. You may have this screening every year starting at age 31 if you have a 30-pack-year history of smoking and currently smoke or have quit within the past 15 years.  Fecal occult blood test (FOBT) of the stool. You may have this test every year starting at age 61.  Flexible sigmoidoscopy or colonoscopy. You may have a sigmoidoscopy every 5 years or a colonoscopy every 10 years starting at age 72.  Prostate cancer screening. Recommendations will vary depending on your family history and other risks.  Hepatitis C blood test.  Hepatitis B blood test.  Sexually transmitted disease (STD) testing.  Diabetes screening. This is done by checking your blood sugar (glucose) after you have not eaten for a while (fasting). You may have this done every 1-3 years.  Abdominal aortic aneurysm (AAA) screening. You may need this if you are a current or former smoker.  Osteoporosis. You may be screened starting at age 29 if you are at high risk. Talk with your health care provider about your test results,  treatment options, and if necessary, the need for more tests. Vaccines  Your health care provider may recommend certain vaccines, such as:  Influenza vaccine. This is recommended every year.  Tetanus, diphtheria, and acellular pertussis (Tdap, Td) vaccine. You  may need a Td booster every 10 years.  Zoster vaccine. You may need this after age 50.  Pneumococcal 13-valent conjugate (PCV13) vaccine. One dose is recommended after age 53.  Pneumococcal polysaccharide (PPSV23) vaccine. One dose is recommended after age 101. Talk to your health care provider about which screenings and vaccines you need and how often you need them. This information is not intended to replace advice given to you by your health care provider. Make sure you discuss any questions you have with your health care provider. Document Released: 01/07/2016 Document Revised: 08/30/2016 Document Reviewed: 10/12/2015 Elsevier Interactive Patient Education  2017 [employer] Prevention in the Home Falls can cause injuries. They can happen to people of all ages. There are many things you can do to make your home safe and to help prevent falls. What can I do on the outside of my home?  Regularly fix the edges of walkways and driveways and fix any cracks.  Remove anything that might make you trip as you walk through a door, such as a raised step or threshold.  Trim any bushes or trees on the path to your home.  Use bright outdoor lighting.  Clear any walking paths of anything that might make someone trip, such as rocks or tools.  Regularly check to see if handrails are loose or broken. Make sure that both sides of any steps have handrails.  Any raised decks and porches should have guardrails on the edges.  Have any leaves, snow, or ice cleared regularly.  Use sand or salt on walking paths during winter.  Clean up any spills in your garage right away. This includes oil or grease spills. What can I do in the bathroom?  Use night lights.  Install grab bars by the toilet and in the tub and shower. Do not use towel bars as grab bars.  Use non-skid mats or decals in the tub or shower.  If you need to sit down in the shower, use a plastic, non-slip stool.  Keep the floor  dry. Clean up any water that spills on the floor as soon as it happens.  Remove soap buildup in the tub or shower regularly.  Attach bath mats securely with double-sided non-slip rug tape.  Do not have throw rugs and other things on the floor that can make you trip. What can I do in the bedroom?  Use night lights.  Make sure that you have a light by your bed that is easy to reach.  Do not use any sheets or blankets that are too big for your bed. They should not hang down onto the floor.  Have a firm chair that has side arms. You can use this for support while you get dressed.  Do not have throw rugs and other things on the floor that can make you trip. What can I do in the kitchen?  Clean up any spills right away.  Avoid walking on wet floors.  Keep items that you use a lot in easy-to-reach places.  If you need to reach something above you, use a strong step stool that has a grab bar.  Keep electrical cords out of the way.  Do not use floor polish or wax that makes floors  slippery. If you must use wax, use non-skid floor wax.  Do not have throw rugs and other things on the floor that can make you trip. What can I do with my stairs?  Do not leave any items on the stairs.  Make sure that there are handrails on both sides of the stairs and use them. Fix handrails that are broken or loose. Make sure that handrails are as long as the stairways.  Check any carpeting to make sure that it is firmly attached to the stairs. Fix any carpet that is loose or worn.  Avoid having throw rugs at the top or bottom of the stairs. If you do have throw rugs, attach them to the floor with carpet tape.  Make sure that you have a light switch at the top of the stairs and the bottom of the stairs. If you do not have them, ask someone to add them for you. What else can I do to help prevent falls?  Wear shoes that:  Do not have high heels.  Have rubber bottoms.  Are comfortable and fit you  well.  Are closed at the toe. Do not wear sandals.  If you use a stepladder:  Make sure that it is fully opened. Do not climb a closed stepladder.  Make sure that both sides of the stepladder are locked into place.  Ask someone to hold it for you, if possible.  Clearly mark and make sure that you can see:  Any grab bars or handrails.  First and last steps.  Where the edge of each step is.  Use tools that help you move around (mobility aids) if they are needed. These include:  Canes.  Walkers.  Scooters.  Crutches.  Turn on the lights when you go into a dark area. Replace any light bulbs as soon as they burn out.  Set up your furniture so you have a clear path. Avoid moving your furniture around.  If any of your floors are uneven, fix them.  If there are any pets around you, be aware of where they are.  Review your medicines with your doctor. Some medicines can make you feel dizzy. This can increase your chance of falling. Ask your doctor what other things that you can do to help prevent falls. This information is not intended to replace advice given to you by your health care provider. Make sure you discuss any questions you have with your health care provider. Document Released: 10/07/2009 Document Revised: 05/18/2016 Document Reviewed: 01/15/2015 Elsevier Interactive Patient Education  2017 [employer].

## 2021-02-26 NOTE — Progress Notes (Signed)
I have reviewed and agree with note, evaluation, plan.   Stephen Hunter, MD  

## 2021-03-03 ENCOUNTER — Ambulatory Visit: Payer: Medicare Other | Admitting: Podiatry

## 2021-03-09 ENCOUNTER — Other Ambulatory Visit: Payer: Self-pay | Admitting: Family Medicine

## 2021-03-10 ENCOUNTER — Ambulatory Visit: Payer: Medicare Other | Admitting: Podiatry

## 2021-03-10 ENCOUNTER — Other Ambulatory Visit: Payer: Self-pay

## 2021-03-10 DIAGNOSIS — M79674 Pain in right toe(s): Secondary | ICD-10-CM

## 2021-03-10 DIAGNOSIS — L97511 Non-pressure chronic ulcer of other part of right foot limited to breakdown of skin: Secondary | ICD-10-CM

## 2021-03-10 DIAGNOSIS — B351 Tinea unguium: Secondary | ICD-10-CM | POA: Diagnosis not present

## 2021-03-10 DIAGNOSIS — M79675 Pain in left toe(s): Secondary | ICD-10-CM

## 2021-03-10 DIAGNOSIS — E1142 Type 2 diabetes mellitus with diabetic polyneuropathy: Secondary | ICD-10-CM | POA: Diagnosis not present

## 2021-03-14 NOTE — Progress Notes (Signed)
  Subjective:  Patient ID: William Brewer, male    DOB: [birth year],  MRN: [id number]  Chief Complaint  Patient presents with  . Wound Check    Right hallux wound check PT stated that he is doing well he has no concerns at this time     75 y.o. male returns with the above complaint. History confirmed with patient.  Thinks it may be healed  Objective:  Physical Exam: warm, good capillary refill, normal DP and PT pulses and absent light touch and protective sensation bilateral plantar foot.  Onychomycosis x10 with significant brown discoloration of the left hallux and second toe Left Foot: No recurrence of ulcers Right Foot: Hallux plantar ulceration has healed since last visit               Assessment:   1. Type 2 diabetes mellitus with polyneuropathy ([place])   2. Chronic ulcer of great toe of right foot, limited to breakdown of skin (HCC)   3. Pain due to onychomycosis of toenails of both feet      Plan:  Patient was evaluated and treated and all questions answered.  Patient educated on diabetes. Discussed proper diabetic foot care and discussed risks and complications of disease. Educated patient in depth on reasons to return to the office immediately should he/she discover anything concerning or new on the feet. All questions answered. Discussed proper shoes as well.  -Wound is healed, like to see him back in 1 month to ensure remains healed and then will see him every 3 months for at risk diabetic foot care  Discussed the etiology and treatment options for the condition in detail with the patient. Educated patient on the topical and oral treatment options for mycotic nails. Recommended debridement of the nails today. Sharp and mechanical debridement performed of all painful and mycotic nails today. Nails debrided in length and thickness using a nail nipper to level of comfort. Discussed treatment options including appropriate shoe gear. Follow up as needed for painful  nails.  .   Return in about 1 month (around 04/10/2021) for check to make sure wound is healed.

## 2021-03-28 NOTE — Progress Notes (Signed)
Phone [phone] In person visit   Subjective:   William Brewer is a 75 y.o. year old very pleasant male patient who presents for/with See problem oriented charting Chief Complaint  Patient presents with  . Hypertension  . Diabetes  . Hyperlipidemia    This visit occurred during the SARS-CoV-2 public health emergency.  Safety protocols were in place, including screening questions prior to the visit, additional usage of staff PPE, and extensive cleaning of exam room while observing appropriate contact time as indicated for disinfecting solutions.   Past Medical History-  Patient Active Problem List   Diagnosis Date Noted  . CAD (coronary artery disease) 11/08/2018    Priority: High  . Diabetes mellitus type II, controlled ([place]) 09/23/2013    Priority: High  . Idiopathic peripheral neuropathy 01/27/2009    Priority: High  . Aortic atherosclerosis ([place]) 06/25/2020    Priority: Medium  . Insomnia 11/08/2018    Priority: Medium  . Right groin hernia 10/24/2016    Priority: Medium  . Essential hypertension 03/26/2008    Priority: Medium  . Hyperlipidemia 06/21/2007    Priority: Medium  . Basal cell carcinoma of skin 10/28/2015    Priority: Low  . Fever blister 08/10/2015    Priority: Low  . History of colonic polyps 06/21/2007    Priority: Low    Medications- reviewed and updated Current Outpatient Medications  Medication Sig Dispense Refill  . aspirin EC 81 MG tablet Take by mouth daily.    . becaplermin (REGRANEX) 0.01 % gel Apply 1 application topically daily. Apply daily to wound bed, cover with sterile gauze 15 g 0  . gabapentin (NEURONTIN) 300 MG capsule Take 2 capsules three times a day. Max for his renal function. 540 capsule 3  . lisinopril (ZESTRIL) 20 MG tablet Take 1 tablet (20 mg total) by mouth daily. 90 tablet 3  . lovastatin (MEVACOR) 40 MG tablet TAKE 1 TABLET BY MOUTH AT BEDTIME 90 tablet 0  . Lysine HCl 1000 MG TABS Take 1,000 mg by mouth daily.     . Magnesium 400 MG TABS Take 250 mg by mouth daily.    . metFORMIN (GLUCOPHAGE) 1000 MG tablet TAKE 1 TABLET BY MOUTH TWICE DAILY WITH MEALS 180 tablet 0  . valACYclovir (VALTREX) 1000 MG tablet TAKE TWO TABLETS BY MOUTH TWICE DAILY FOR 1 DAY AS NEEDED 20 tablet 3  . zolpidem (AMBIEN) 5 MG tablet TAKE 1 TABLET BY MOUTH AT BEDTIME AS NEEDED FOR SLEEP 30 tablet 5  . empagliflozin (JARDIANCE) 10 MG TABS tablet Take 1 tablet (10 mg total) by mouth daily before breakfast. 90 tablet 3   No current facility-administered medications for this visit.     Objective:  BP 134/80   Pulse 67   Temp 97.6 F (36.4 C) (Temporal)   Ht 6' (1.829 m)   Wt 187 lb 6.4 oz (85 kg)   SpO2 95%   BMI 25.42 kg/m  Gen: NAD, resting comfortably CV: RRR no murmurs rubs or gallops Lungs: CTAB no crackles, wheeze, rhonchi Abdomen: soft/nontender/nondistended/normal bowel sounds. Ext: no edema Skin: warm, dry    Assessment and Plan   #CAD-nonobstructive based off CAD in 2019 #hyperlipidemia #Aortic atherosclerosis S: Medication:Lovastatin 40Mg , aspirin 81 mg  Thankfully patient remains without chest pain or shortness of breath  Lab Results  Component Value Date   CHOL 125 03/02/2020   HDL 32.60 (L) 03/02/2020   LDLCALC 59 03/02/2020   LDLDIRECT 72 07/20/2020   TRIG 163.0 (H)  03/02/2020   CHOLHDL 4 03/02/2020   A/P: CAD nonobstructive remains asymptomatic.  For aortic atherosclerosis and CAD-continue risk factor modification with LDL goal under 70 which he was at last year-due for lipid panel this time -Jardiance may lower cardiovascular risk  # Diabetes  S: Medication: Metformin 1000Mg  twice daily, Jardiance 10Mg  (out for several weeks at least) CBGs- does not check Exercise and diet- limited exercise last visit-was having some issues with his foot- recurrent ulcerations - still having same issues Lab Results  Component Value Date   HGBA1C 6.9 (H) 12/02/2020   HGBA1C 7.6 (H) 07/20/2020   HGBA1C  8.5 (H) 03/02/2020   A/P: Hopefully remains controlled but worried with him bing out of jardiance-continue current medication most likely-update A1c today  #hypertension S: medication: Lisinopril 20Mg .  We previously stopped hydrochlorothiazide 12.5 mg due to orthostatic symptoms-was still having some mild symptoms last visit and we considered reducing to 10 mg  Recently no orthostatic symptoms A/P: Stable. Continue current medications.   #Idiopathic neuropathy predating diabetes S: Last visit we increase gabapentin to 600 mg 3 times a day (max dose for his renal function) from 300 mg 3 times a day. -We also submitted for hand controls due to worsening neuropathy in his feet (trouble detecting pedals safely)- also having some hand issues and cost is 8k so opted out  Today patient reports helpful but not perfect  A/P: for now patient wants to continue with regimen and pain acceptance- may look at other options in the future such as lyrica.    Recommended follow up: Return in about 4 months (around 07/31/2021) for physical or sooner if needed. Future Appointments  Date Time Provider [place]  04/14/2021 11:15 AM Criselda Peaches, DPM [facility]  03/03/2022  2:30 PM [facility] COACH [facility]    Lab/Order associations: NOT FASTING   ICD-10-CM   1. Essential hypertension  I10 CBC with Differential/Platelet    Comprehensive metabolic panel    Lipid panel  2. Controlled type 2 diabetes mellitus with diabetic polyneuropathy, without long-term current use of insulin (HCC)  E11.42 CBC with Differential/Platelet    Comprehensive metabolic panel    Lipid panel    Hemoglobin A1c  3. Mixed hyperlipidemia  E78.2 CBC with Differential/Platelet    Comprehensive metabolic panel    Lipid panel  4. Aortic atherosclerosis (HCC) Chronic I70.0    Meds ordered this encounter  Medications  . empagliflozin (JARDIANCE) 10 MG TABS tablet    Sig: Take 1 tablet (10 mg total) by  mouth daily before breakfast.    Dispense:  90 tablet    Refill:  3   Return precautions advised.  Garret Reddish, MD

## 2021-03-28 NOTE — Patient Instructions (Addendum)
Please stop by lab before you go If you have mychart- we will send your results within 3 business days of [place] receiving them.  If you do not have mychart- we will call you about results within 5 business days of [place] receiving them.  *please also note that you will see labs on mychart as soon as they post. I will later go in and write notes on them- will say "notes from Dr. Yong Channel"  No changes today unless labs lead [place] to make changes- other than restarting jardiance which I sent in today

## 2021-03-29 ENCOUNTER — Telehealth: Payer: Self-pay | Admitting: *Deleted

## 2021-03-29 NOTE — Telephone Encounter (Signed)
Patient is requesting a prescription(Promogran) for a sample received in office.Please advise.

## 2021-03-31 ENCOUNTER — Other Ambulatory Visit: Payer: Self-pay

## 2021-03-31 ENCOUNTER — Encounter: Payer: Self-pay | Admitting: Family Medicine

## 2021-03-31 ENCOUNTER — Ambulatory Visit (INDEPENDENT_AMBULATORY_CARE_PROVIDER_SITE_OTHER): Payer: Medicare Other | Admitting: Family Medicine

## 2021-03-31 VITALS — BP 134/80 | HR 67 | Temp 97.6°F | Ht 72.0 in | Wt 187.4 lb

## 2021-03-31 DIAGNOSIS — E782 Mixed hyperlipidemia: Secondary | ICD-10-CM | POA: Diagnosis not present

## 2021-03-31 DIAGNOSIS — I10 Essential (primary) hypertension: Secondary | ICD-10-CM | POA: Diagnosis not present

## 2021-03-31 DIAGNOSIS — E1142 Type 2 diabetes mellitus with diabetic polyneuropathy: Secondary | ICD-10-CM | POA: Diagnosis not present

## 2021-03-31 DIAGNOSIS — I700 Atherosclerosis of aorta: Secondary | ICD-10-CM

## 2021-03-31 LAB — CBC WITH DIFFERENTIAL/PLATELET
Basophils Absolute: 0 10*3/uL (ref 0.0–0.1)
Basophils Relative: 0.5 % (ref 0.0–3.0)
Eosinophils Absolute: 0.2 10*3/uL (ref 0.0–0.7)
Eosinophils Relative: 3.4 % (ref 0.0–5.0)
HCT: 44.6 % (ref 39.0–52.0)
Hemoglobin: 15.3 g/dL (ref 13.0–17.0)
Lymphocytes Relative: 25.6 % (ref 12.0–46.0)
Lymphs Abs: 1.5 10*3/uL (ref 0.7–4.0)
MCHC: 34.2 g/dL (ref 30.0–36.0)
MCV: 91.4 fl (ref 78.0–100.0)
Monocytes Absolute: 0.5 10*3/uL (ref 0.1–1.0)
Monocytes Relative: 8.8 % (ref 3.0–12.0)
Neutro Abs: 3.5 10*3/uL (ref 1.4–7.7)
Neutrophils Relative %: 61.7 % (ref 43.0–77.0)
Platelets: 172 10*3/uL (ref 150.0–400.0)
RBC: 4.89 Mil/uL (ref 4.22–5.81)
RDW: 13.2 % (ref 11.5–15.5)
WBC: 5.7 10*3/uL (ref 4.0–10.5)

## 2021-03-31 LAB — COMPREHENSIVE METABOLIC PANEL
ALT: 41 U/L (ref 0–53)
AST: 35 U/L (ref 0–37)
Albumin: 4.7 g/dL (ref 3.5–5.2)
Alkaline Phosphatase: 57 U/L (ref 39–117)
BUN: 24 mg/dL — ABNORMAL HIGH (ref 6–23)
CO2: 29 mEq/L (ref 19–32)
Calcium: 9.7 mg/dL (ref 8.4–10.5)
Chloride: 100 mEq/L (ref 96–112)
Creatinine, Ser: 0.97 mg/dL (ref 0.40–1.50)
GFR: 76.9 mL/min (ref >60.00)
Glucose, Bld: 143 mg/dL — ABNORMAL HIGH (ref 70–99)
Potassium: 4.1 mEq/L (ref 3.5–5.1)
Sodium: 138 mEq/L (ref 135–145)
Total Bilirubin: 0.7 mg/dL (ref 0.2–1.2)
Total Protein: 7.1 g/dL (ref 6.0–8.3)

## 2021-03-31 LAB — LIPID PANEL
Cholesterol: 144 mg/dL (ref 0–200)
HDL: 40.3 mg/dL (ref >39.00)
NonHDL: 103.48
Total CHOL/HDL Ratio: 4
Triglycerides: 220 mg/dL — ABNORMAL HIGH (ref 0.0–149.0)
VLDL: 44 mg/dL — ABNORMAL HIGH (ref 0.0–40.0)

## 2021-03-31 LAB — HEMOGLOBIN A1C: Hgb A1c MFr Bld: 7.5 % — ABNORMAL HIGH (ref 4.6–6.5)

## 2021-03-31 LAB — LDL CHOLESTEROL, DIRECT: Direct LDL: 88 mg/dL

## 2021-03-31 MED ORDER — EMPAGLIFLOZIN 10 MG PO TABS
10.0000 mg | ORAL_TABLET | Freq: Every day | ORAL | 3 refills | Status: DC
Start: 2021-03-31 — End: 2022-03-30

## 2021-04-01 ENCOUNTER — Other Ambulatory Visit: Payer: Self-pay

## 2021-04-01 MED ORDER — ROSUVASTATIN CALCIUM 10 MG PO TABS: 10.0000 mg | ORAL_TABLET | Freq: Every day | ORAL | 3 refills | Status: DC

## 2021-04-07 ENCOUNTER — Other Ambulatory Visit: Payer: Self-pay | Admitting: Family Medicine

## 2021-04-12 ENCOUNTER — Telehealth: Payer: Self-pay | Admitting: Family Medicine

## 2021-04-12 NOTE — Progress Notes (Signed)
  Chronic Care Management   Outreach Note  04/12/2021 Name: William Brewer MRN: [id number] DOB: 04/13/1946  Referred by: Marin Olp, MD Reason for referral : No chief complaint on file.   An unsuccessful telephone outreach was attempted today. The patient was referred to the pharmacist for assistance with care management and care coordination.   Follow Up Plan:   Lauretta Grill Upstream Scheduler

## 2021-04-12 NOTE — Chronic Care Management (AMB) (Signed)
  Chronic Care Management   Note  04/12/2021 Name: William Brewer MRN: [id number] DOB: 07-27-1946  William Brewer is a 75 y.o. year old male who is a primary care patient of Marin Olp, MD. I reached out to Haze Rushing by phone today in response to a referral sent by William Brewer [facility] PCP, Marin Olp, MD.   William Brewer was given information about Chronic Care Management services today including:  1. CCM service includes personalized support from designated clinical staff supervised by his physician, including individualized plan of care and coordination with other care providers 2. 24/7 contact phone numbers for assistance for urgent and routine care needs. 3. Service will only be billed when office clinical staff spend 20 minutes or more in a month to coordinate care. 4. Only one practitioner may furnish and bill the service in a calendar month. 5. The patient may stop CCM services at any time (effective at the end of the month) by phone call to the office staff.   Patient agreed to services and verbal consent obtained.   Follow up plan:   Lauretta Grill Upstream Scheduler

## 2021-04-14 ENCOUNTER — Other Ambulatory Visit: Payer: Self-pay

## 2021-04-14 ENCOUNTER — Ambulatory Visit: Payer: Medicare Other | Admitting: Podiatry

## 2021-04-14 DIAGNOSIS — L97511 Non-pressure chronic ulcer of other part of right foot limited to breakdown of skin: Secondary | ICD-10-CM | POA: Diagnosis not present

## 2021-04-17 NOTE — Progress Notes (Signed)
  Subjective:  Patient ID: William Brewer, male    DOB: Jan 13, 1946,  MRN: [id number]  Chief Complaint  Patient presents with  . Wound Check    Pt states he is having some discharge.     75 y.o. male returns with the above complaint. History confirmed with patient.  Unfortunately has opened up and started draining again Objective:  Physical Exam: warm, good capillary refill, normal DP and PT pulses and absent light touch and protective sensation bilateral plantar foot.  Onychomycosis x10 with significant brown discoloration of the left hallux and second toe  Right Foot: Unfortunately has recurred and measures 2.2 cm x 0.2 cm x 0.1 cm                 Assessment:   1. Chronic ulcer of great toe of right foot, limited to breakdown of skin ([place])      Plan:  Patient was evaluated and treated and all questions answered.  Ulcer right hallux -Debridement as below. -Dressed with Iodosorb, DSD. -Continue off-loading with surgical shoe.  Procedure: Excisional Debridement of Wound Rationale: Removal of non-viable soft tissue from the wound to promote healing.  Anesthesia: none  Post-Debridement Wound Measurements:  2.2 cm x 0.2 cm x 0.1 cm  Type of Debridement: Sharp Excisional Tissue Removed: Non-viable soft tissue Depth of Debridement: subcutaneous tissue. Technique: Sharp excisional debridement to bleeding, viable wound base.  Dressing: Dry, sterile, compression dressing. Disposition: Patient tolerated procedure well.       .   Return in about 3 weeks (around 05/05/2021) for wound care.

## 2021-05-03 ENCOUNTER — Ambulatory Visit: Payer: Medicare Other | Admitting: Podiatry

## 2021-05-05 ENCOUNTER — Telehealth: Payer: Self-pay

## 2021-05-05 NOTE — Chronic Care Management (AMB) (Signed)
Chronic Care Management Pharmacy Assistant   Name: William Brewer  MRN: [id number] DOB: 27-Mar-1946  William Brewer is an 75 y.o. year old male who presents for his initial CCM visit with the clinical pharmacist.  Reason for Encounter: Chart Prep/ IQ  Recent office visits:  03/31/21- Garret Reddish, MD- chronic conditions addressed, restarted  empagliflozin 10 mg,  follow up 4 months  12/02/20- Garret Reddish, MD- increased gabapentin from 1 cap tid to 2 caps tid,flu vaccine administered, follow up 4 months  Recent consult visits:  04/14/21- Lanae Crumbly, DPM (Podiatry)- seen for wound check, debridement performed, follow up 3 weeks  03/10/21- Lanae Crumbly, DPM (Podiatry)-seen for wound check, debridement performed, follow up 1 month 02/07/21-  Lanae Crumbly, DPM (Podiatry)- seen for wound check, debridement performed, follow up 3 weeks  01/20/21-  Lanae Crumbly, DPM (Podiatry)- seen for wound check, debridement performed, follow up 3 weeks   Hospital visits:  None in previous 6 months  Medications: Outpatient Encounter Medications as of 05/05/2021  Medication Sig  . aspirin EC 81 MG tablet Take by mouth daily.  . becaplermin (REGRANEX) 0.01 % gel Apply 1 application topically daily. Apply daily to wound bed, cover with sterile gauze  . empagliflozin (JARDIANCE) 10 MG TABS tablet Take 1 tablet (10 mg total) by mouth daily before breakfast.  . gabapentin (NEURONTIN) 300 MG capsule Take 2 capsules three times a day. Max for his renal function.  Marland Kitchen lisinopril (ZESTRIL) 20 MG tablet Take 1 tablet (20 mg total) by mouth daily.  Marland Kitchen Lysine HCl 1000 MG TABS Take 1,000 mg by mouth daily.  . Magnesium 400 MG TABS Take 250 mg by mouth daily.  . metFORMIN (GLUCOPHAGE) 1000 MG tablet TAKE 1 TABLET BY MOUTH TWICE DAILY WITH MEALS  . rosuvastatin (CRESTOR) 10 MG tablet Take 1 tablet (10 mg total) by mouth daily.  . valACYclovir (VALTREX) 1000 MG tablet TAKE TWO TABLETS BY MOUTH TWICE DAILY  FOR 1 DAY AS NEEDED  . zolpidem (AMBIEN) 5 MG tablet TAKE 1 TABLET BY MOUTH AT BEDTIME AS NEEDED FOR SLEEP   No facility-administered encounter medications on file as of 05/05/2021.   Current Documented Medications  Asprin 81 mg  Becaplermin 0.01% Empagliflozin 10 mg- 90 DS last filled 03/31/21 Gabapentin 300 mg- 90 DS last filled 03/09/21 Lisinopril 20 mg- 90 DS last filled 04/07/21 Lysine 1000 mg Magnesium 400 mg Metformin 1000 mg- 90 DS last filled 04/07/21 Rosuvastatin 10 mg- 90 DS last filled 04/01/21 Valacyclovir 1000 mg - 5 DS last filled 02/22/21 prn  Zolpidem 5 mg - 30 DS last filled 04/20/21  Have you seen any other providers since your last visit?  No providers seen since last visit. Patient will be seeing podiatry 05/17 for wound care. Patient stated he see's podiatry monthly due to his healing wound on his right foot.  Any changes in your medications or health?  Patient stated there has been no changes and all documented medications remain the same  Any side effects from any medications?  Patient stated he  has no side effects  Do you have an symptoms or problems not managed by your medications? Patient stated he has no unmanaged symptoms  Any concerns about your health right now?  Patient stated he has no concerns   Has your provider asked that you check blood pressure, blood sugar, or follow special diet at home?  Patient stated that he believes it was mentioned for him to regularly check his BP,  but he does not do so. He is unsure if he owns a monitor and is reliant on office readings  Do you get any type of exercise on a regular basis? Patient stated he is limited to activity due to the wound on his right foot. He previously had a similar wound on his left foot, but it has since then healed up. He is able to walk but denies the ability to do anything strenuous due to his foot.   Can you think of a goal you would like to reach for your health? Patient did not  have any  goals   Do you have any problems getting your medications?  Patient does not have any problems with [facility]   Is there anything that you would like to discuss during the appointment?  Patient did not have anything to discuss  Please bring medications and supplements to appointment Reminded patient of initial phone visit with CPP on 05/17 at 3:30 pm, he is unable to make it. Rescheduled initial phone visit with CPP for 05/31 at 4 pm  Wil[facility] Sports CPA, CMA

## 2021-05-10 ENCOUNTER — Ambulatory Visit: Payer: Medicare Other | Admitting: Podiatry

## 2021-05-10 ENCOUNTER — Telehealth: Payer: Medicare Other

## 2021-05-16 ENCOUNTER — Encounter: Payer: Self-pay | Admitting: Podiatry

## 2021-05-16 ENCOUNTER — Other Ambulatory Visit: Payer: Self-pay

## 2021-05-16 ENCOUNTER — Ambulatory Visit: Payer: Medicare Other | Admitting: Podiatry

## 2021-05-16 DIAGNOSIS — L97511 Non-pressure chronic ulcer of other part of right foot limited to breakdown of skin: Secondary | ICD-10-CM | POA: Diagnosis not present

## 2021-05-16 DIAGNOSIS — M79674 Pain in right toe(s): Secondary | ICD-10-CM | POA: Diagnosis not present

## 2021-05-16 DIAGNOSIS — M79675 Pain in left toe(s): Secondary | ICD-10-CM | POA: Diagnosis not present

## 2021-05-16 DIAGNOSIS — E1142 Type 2 diabetes mellitus with diabetic polyneuropathy: Secondary | ICD-10-CM | POA: Diagnosis not present

## 2021-05-16 DIAGNOSIS — B351 Tinea unguium: Secondary | ICD-10-CM | POA: Diagnosis not present

## 2021-05-17 NOTE — Progress Notes (Signed)
  Subjective:  Patient ID: William Brewer, male    DOB: 06-May-1946,  MRN: [id number]  Chief Complaint  Patient presents with  . Diabetic Ulcer     (urgnt workin-per Dr.Melaney Tellefsen) 3wk fu wound check    75 y.o. male returns with the above complaint. History confirmed with patient.  Doing better. Nails are becoming painful and long again   Objective:  Physical Exam: warm, good capillary refill, normal DP and PT pulses and absent light touch and protective sensation bilateral plantar foot.  Onychomycosis x10 with significant brown discoloration of the left hallux and second toe  Right Foot: improved today, no SOI measures 1.0 x 0.2 x 0.1    Assessment:   1. Chronic ulcer of great toe of right foot, limited to breakdown of skin ([place])   2. Type 2 diabetes mellitus with polyneuropathy (HCC)   3. Pain due to onychomycosis of toenails of both feet      Plan:  Patient was evaluated and treated and all questions answered.  Ulcer right hallux -Debridement as below. -Dressed with Iodosorb, DSD. -Continue mupirocin @ home   Procedure: Excisional Debridement of Wound Rationale: Removal of non-viable soft tissue from the wound to promote healing.  Anesthesia: none  Post-Debridement Wound Measurements:  1.0 x 0.2 x 0.1 Type of Debridement: Sharp Excisional Tissue Removed: Non-viable soft tissue Depth of Debridement: subcutaneous tissue. Technique: Sharp excisional debridement to bleeding, viable wound base.  Dressing: Dry, sterile, compression dressing. Disposition: Patient tolerated procedure well.      Discussed the etiology and treatment options for the condition in detail with the patient. Educated patient on the topical and oral treatment options for mycotic nails. Recommended debridement of the nails today. Sharp and mechanical debridement performed of all painful and mycotic nails today. Nails debrided in length and thickness using a nail nipper to level of comfort. Discussed  treatment options including appropriate shoe gear. Follow up as needed for painful nails.  .   Return in about 3 weeks (around 06/06/2021) for wound care.

## 2021-05-20 ENCOUNTER — Telehealth: Payer: Self-pay | Admitting: *Deleted

## 2021-05-20 NOTE — Telephone Encounter (Signed)
completed

## 2021-05-24 ENCOUNTER — Ambulatory Visit (INDEPENDENT_AMBULATORY_CARE_PROVIDER_SITE_OTHER): Payer: Medicare Other

## 2021-05-24 DIAGNOSIS — E782 Mixed hyperlipidemia: Secondary | ICD-10-CM | POA: Diagnosis not present

## 2021-05-24 DIAGNOSIS — I10 Essential (primary) hypertension: Secondary | ICD-10-CM

## 2021-05-24 DIAGNOSIS — E1142 Type 2 diabetes mellitus with diabetic polyneuropathy: Secondary | ICD-10-CM | POA: Diagnosis not present

## 2021-05-24 NOTE — Patient Instructions (Addendum)
William Brewer,  Thank you for talking with me today. I have included our care plan/goals in the following pages.   Please review and call me at [phone] with any questions.  Thanks! William Brewer, Pharm.D., BCGP Clinical Pharmacist [facility] [place] [phone] Patient Care Plan: [place] Plan    Problem Identified: HTN DMII HLD CAD Insomnia Idopathic Neuropathy   Priority: High    Long-Range Goal: Disease Management   Start Date: 05/24/2021  Expected End Date: 05/24/2022  This Visit's Progress: On track  Priority: High  Note:   Current Barriers:  . Unable to maintain control of DM II  Pharmacist Clinical Goal(s):  Marland Kitchen Patient will contact provider office for questions/concerns as evidenced notation of same in electronic health record through collaboration with PharmD and provider.   Interventions: . 1:1 collaboration with Marin Olp, MD regarding development and update of comprehensive plan of care as evidenced by provider attestation and co-signature . Inter-disciplinary care team collaboration (see longitudinal plan of care) . Comprehensive medication review performed; medication list updated in electronic medical record . No Rx changes   Hypertension (BP goal <130/80) -Controlled -Current treatment: . Lisinopril 20 mg once daily -Current home readings: none provided -Denies hypotensive/hypertensive symptoms -Educated on Daily salt intake goal < 2300 mg; Symptoms of hypotension and importance of maintaining adequate hydration; -Counseled on diet and exercise extensively Recommended to continue current medication  Hyperlipidemia: (LDL goal < 70) -Not ideally controlled -CAD, DM, HTN, HLD -Current treatment: . Rosuvastatin 10 mg once daily (03/2021 start) -Medications previously tried: lovastatin  -Educated on Cholesterol goals;  Benefits of statin for ASCVD risk reduction; -Recommended to  continue current medication  Diabetes (A1c goal <7%) -Not ideally controlled. a1c 7.5% 03/2021 -Current medications: Marland Kitchen Metformin 1000 mg twice daily with meals  . Jardiance 10 mg once daily -Current home glucose readings . fasting glucose: not testing based off of preference, not limited by neuropathy -Denies hypoglycemic/hyperglycemic symptoms -Current meal patterns: intermittent fasting. Likes sweets. -Current exercise: walking limited by sore on foot -Educated on A1c and blood sugar goals; -Counseled to check feet daily and get yearly eye exams -Recommended to continue current medication Assessed patient finances. Will try for Jardiance PAP  Insomnia (Goal: optimize sleep hygiene) -Controlled -Current treatment  . Zoldipem 5 mg once daily  -Reviewed side effects - no problems noted -Recommended to continue current medication Further review at 2 month f/u  Patient Goals/Self-Care Activities . Patient will:  -complete PAP application for Jardiance  Follow Up Plan: [facility] f/u 3 months - review PAP Medication Assistance: Application for Jardiance  medication assistance program. in process.  Anticipated assistance start date 05/2021 if approved.    The patient was given the following information about Chronic Care Management services today, agreed to services, and gave verbal consent: 1. CCM service includes personalized support from designated clinical staff supervised by the primary care provider, including individualized plan of care and coordination with other care providers 2. 24/7 contact phone numbers for assistance for urgent and routine care needs. 3. Service will only be billed when office clinical staff spend 20 minutes or more in a month to coordinate care. 4. Only one practitioner may furnish and bill the service in a calendar month. 5.The patient may stop CCM services at any time (effective at the end of the month) by phone call to the office staff. 6. The patient will be  responsible for cost sharing (co-pay)  of up to 20% of the service fee (after annual deductible is met). Patient agreed to services and consent obtained.  The patient verbalized understanding of instructions provided today and agreed to receive a MyChart copy of patient instruction and/or educational materials. Telephone follow up appointment with pharmacy team member scheduled for: See next appointment with "Care Management Staff" under "What's Next" below.   Diabetes Mellitus and Nutrition, Adult When you have diabetes, or diabetes mellitus, it is very important to have healthy eating habits because your blood sugar (glucose) levels are greatly affected by what you eat and drink. Eating healthy foods in the right amounts, at about the same times every day, can help you:  Control your blood glucose.  Lower your risk of heart disease.  Improve your blood pressure.  Reach or maintain a healthy weight. What can affect my meal plan? Every person with diabetes is different, and each person has different needs for a meal plan. Your health care provider may recommend that you work with a dietitian to make a meal plan that is best for you. Your meal plan may vary depending on factors such as:  The calories you need.  The medicines you take.  Your weight.  Your blood glucose, blood pressure, and cholesterol levels.  Your activity level.  Other health conditions you have, such as heart or kidney disease. How do carbohydrates affect me? Carbohydrates, also called carbs, affect your blood glucose level more than any other type of food. Eating carbs naturally raises the amount of glucose in your blood. Carb counting is a method for keeping track of how many carbs you eat. Counting carbs is important to keep your blood glucose at a healthy level, especially if you use insulin or take certain oral diabetes medicines. It is important to know how many carbs you can safely have in each meal. This is  different for every person. Your dietitian can help you calculate how many carbs you should have at each meal and for each snack. How does alcohol affect me? Alcohol can cause a sudden decrease in blood glucose (hypoglycemia), especially if you use insulin or take certain oral diabetes medicines. Hypoglycemia can be a life-threatening condition. Symptoms of hypoglycemia, such as sleepiness, dizziness, and confusion, are similar to symptoms of having too much alcohol.  Do not drink alcohol if: ? Your health care provider tells you not to drink. ? You are pregnant, may be pregnant, or are planning to become pregnant.  If you drink alcohol: ? Do not drink on an empty stomach. ? Limit how much you use to:  0-1 drink a day for women.  0-2 drinks a day for men. ? Be aware of how much alcohol is in your drink. In the [place] one drink equals one 12 oz bottle of beer (355 mL), one 5 oz glass of wine (148 mL), or one 1 oz glass of hard liquor (44 mL). ? Keep yourself hydrated with water, diet soda, or unsweetened iced tea.  Keep in mind that regular soda, juice, and other mixers may contain a lot of sugar and must be counted as carbs. What are tips for following this plan? Reading food labels  Start by checking the serving size on the "Nutrition Facts" label of packaged foods and drinks. The amount of calories, carbs, fats, and other nutrients listed on the label is based on one serving of the item. Many items contain more than one serving per package.  Check the total grams (g) of  carbs in one serving. You can calculate the number of servings of carbs in one serving by dividing the total carbs by 15. For example, if a food has 30 g of total carbs per serving, it would be equal to 2 servings of carbs.  Check the number of grams (g) of saturated fats and trans fats in one serving. Choose foods that have a low amount or none of these fats.  Check the number of milligrams (mg) of salt (sodium) in one  serving. Most people should limit total sodium intake to less than 2,300 mg per day.  Always check the nutrition information of foods labeled as "low-fat" or "nonfat." These foods may be higher in added sugar or refined carbs and should be avoided.  Talk to your dietitian to identify your daily goals for nutrients listed on the label. Shopping  Avoid buying canned, pre-made, or processed foods. These foods tend to be high in fat, sodium, and added sugar.  Shop around the outside edge of the grocery store. This is where you will most often find fresh fruits and vegetables, bulk grains, fresh meats, and fresh dairy. Cooking  Use low-heat cooking methods, such as baking, instead of high-heat cooking methods like deep frying.  Cook using healthy oils, such as olive, canola, or sunflower oil.  Avoid cooking with butter, cream, or high-fat meats. Meal planning  Eat meals and snacks regularly, preferably at the same times every day. Avoid going long periods of time without eating.  Eat foods that are high in fiber, such as fresh fruits, vegetables, beans, and whole grains. Talk with your dietitian about how many servings of carbs you can eat at each meal.  Eat 4-6 oz (112-168 g) of lean protein each day, such as lean meat, chicken, fish, eggs, or tofu. One ounce (oz) of lean protein is equal to: ? 1 oz (28 g) of meat, chicken, or fish. ? 1 egg. ?  cup (62 g) of tofu.  Eat some foods each day that contain healthy fats, such as avocado, nuts, seeds, and fish.   What foods should I eat? Fruits Berries. Apples. Oranges. Peaches. Apricots. Plums. Grapes. Mango. Papaya. Pomegranate. Kiwi. Cherries. Vegetables Lettuce. Spinach. Leafy greens, including kale, chard, collard greens, and mustard greens. Beets. Cauliflower. Cabbage. Broccoli. Carrots. Green beans. Tomatoes. Peppers. Onions. Cucumbers. Brussels sprouts. Grains Whole grains, such as whole-wheat or whole-grain bread, crackers,  tortillas, cereal, and pasta. Unsweetened oatmeal. Quinoa. Brown or wild rice. Meats and other proteins Seafood. Poultry without skin. Lean cuts of poultry and beef. Tofu. Nuts. Seeds. Dairy Low-fat or fat-free dairy products such as milk, yogurt, and cheese. The items listed above may not be a complete list of foods and beverages you can eat. Contact a dietitian for more information. What foods should I avoid? Fruits Fruits canned with syrup. Vegetables Canned vegetables. Frozen vegetables with butter or cream sauce. Grains Refined white flour and flour products such as bread, pasta, snack foods, and cereals. Avoid all processed foods. Meats and other proteins Fatty cuts of meat. Poultry with skin. Breaded or fried meats. Processed meat. Avoid saturated fats. Dairy Full-fat yogurt, cheese, or milk. Beverages Sweetened drinks, such as soda or iced tea. The items listed above may not be a complete list of foods and beverages you should avoid. Contact a dietitian for more information. Questions to ask a health care provider  Do I need to meet with a diabetes educator?  Do I need to meet with a dietitian?  What  number can I call if I have questions?  When are the best times to check my blood glucose? Where to find more information:  American Diabetes Association: diabetes.org  Academy of Nutrition and Dietetics: www.eatright.[employer] of Diabetes and Digestive and Kidney Diseases: [employer]  Association of Diabetes Care and Education Specialists: www.diabeteseducator.org Summary  It is important to have healthy eating habits because your blood sugar (glucose) levels are greatly affected by what you eat and drink.  A healthy meal plan will help you control your blood glucose and maintain a healthy lifestyle.  Your health care provider may recommend that you work with a dietitian to make a meal plan that is best for you.  Keep in mind that carbohydrates  (carbs) and alcohol have immediate effects on your blood glucose levels. It is important to count carbs and to use alcohol carefully. This information is not intended to replace advice given to you by your health care provider. Make sure you discuss any questions you have with your health care provider. Document Revised: 11/18/2019 Document Reviewed: 11/18/2019 Elsevier Patient Education  2021 [employer].

## 2021-05-24 NOTE — Progress Notes (Signed)
Chronic Care Management Pharmacy Note  05/24/2021 Name:  William Brewer MRN:  [id number] DOB:  [birth year]  Summary: initial CCM visit  Recommendations/Changes made from today's visit: No Rx changes - reviewed patient assistance on Jardiance - might qualify based on information provided by pt today, ok with providing financial information with application   Plan: CPA send application for patient assistance to patient. RPh telephone call f/u 2 months  _____________ Subjective: William Brewer is an 75 y.o. year old male who is a primary patient of Hunter, Brayton Mars, MD.  The CCM team was consulted for assistance with disease management and care coordination needs.    Engaged with patient by telephone for initial visit in response to provider referral for pharmacy case management and/or care coordination services.   Consent to Services:  The patient was given the following information about Chronic Care Management services today, agreed to services, and gave verbal consent: 1. CCM service includes personalized support from designated clinical staff supervised by the primary care provider, including individualized plan of care and coordination with other care providers 2. 24/7 contact phone numbers for assistance for urgent and routine care needs. 3. Service will only be billed when office clinical staff spend 20 minutes or more in a month to coordinate care. 4. Only one practitioner may furnish and bill the service in a calendar month. 5.The patient may stop CCM services at any time (effective at the end of the month) by phone call to the office staff. 6. The patient will be responsible for cost sharing (co-pay) of up to 20% of the service fee (after annual deductible is met). Patient agreed to services and consent obtained.   Patient Care Team: Marin Olp, MD as PCP - General (Family Medicine) Dorothy Spark, MD (Inactive) as PCP - Cardiology (Cardiology) Juluis Rainier as  Consulting Physician (Optometry) Paulla Dolly, Tamala Fothergill, DPM as Consulting Physician (Podiatry) Madelin Rear, [facility] as Pharmacist (Pharmacist)  Objective: Lab Results  Component Value Date   CREATININE 0.97 03/31/2021   CREATININE 0.97 12/02/2020   CREATININE 1.12 07/20/2020   Lab Results  Component Value Date   HGBA1C 7.5 (H) 03/31/2021   Last diabetic Eye exam:  Lab Results  Component Value Date/Time   HMDIABEYEEXA No Retinopathy 03/30/2020 12:00 AM    Last diabetic Foot exam: No results found for: HMDIABFOOTEX      Component Value Date/Time   CHOL 144 03/31/2021 1329   TRIG 220.0 (H) 03/31/2021 1329   HDL 40.30 03/31/2021 1329   CHOLHDL 4 03/31/2021 1329   VLDL 44.0 (H) 03/31/2021 1329   LDLCALC 59 03/02/2020 0920   LDLDIRECT 88.0 03/31/2021 1329    Hepatic Function Latest Ref Rng & Units 03/31/2021 12/02/2020 07/20/2020  Total Protein 6.0 - 8.3 g/dL 7.1 7.1 7.1  Albumin 3.5 - 5.2 g/dL 4.7 - -  AST 0 - 37 U/L 35 29 22  ALT 0 - 53 U/L 41 31 26  Alk Phosphatase 39 - 117 U/L 57 - -  Total Bilirubin 0.2 - 1.2 mg/dL 0.7 0.6 0.6  Bilirubin, Direct 0.0 - 0.3 mg/dL - - -    Lab Results  Component Value Date/Time   TSH 3.89 02/12/2018 08:45 AM   TSH 2.13 11/30/2014 10:03 AM    CBC Latest Ref Rng & Units 03/31/2021 07/20/2020 06/23/2020  WBC 4.0 - 10.5 K/uL 5.7 5.7 15.4(H)  Hemoglobin 13.0 - 17.0 g/dL 15.3 14.8 13.8  Hematocrit 39.0 - 52.0 % 44.6 45.0  41.8  Platelets 150.0 - 400.0 K/uL 172.0 193 237    No results found for: VD25OH  Clinical ASCVD:  The 10-year ASCVD risk score Mikey Bussing DC Jr., et al., 2013) is: 46.4%   Values used to calculate the score:     Age: 75 years     Sex: Male     Is Non-Hispanic African American: No     Diabetic: Yes     Tobacco smoker: No     Systolic Blood Pressure: 800 mmHg     Is BP treated: Yes     HDL Cholesterol: 40.3 mg/dL     Total Cholesterol: 144 mg/dL    Social History   Tobacco Use  Smoking Status Never Smoker  Smokeless Tobacco  Never Used   BP Readings from Last 3 Encounters:  03/31/21 134/80  12/02/20 113/67  07/20/20 (!) 100/60   Pulse Readings from Last 3 Encounters:  03/31/21 67  12/02/20 87  07/20/20 79   Wt Readings from Last 3 Encounters:  03/31/21 187 lb 6.4 oz (85 kg)  12/02/20 183 lb (83 kg)  10/27/20 188 lb (85.3 kg)    Assessment: Review of patient past medical history, allergies, medications, health status, including review of consultants reports, laboratory and other test data, was performed as part of comprehensive evaluation and provision of chronic care management services.   SDOH:  (Social Determinants of Health) assessments and interventions performed: Yes.   CCM Care Plan  No Active Allergies  Medications Reviewed Today    Reviewed by Madelin Rear, [facility] (Pharmacist) on 05/24/21 at [place] List Status: <None>  Medication Order Taking? Sig Documenting Provider Last Dose Status Informant  aspirin EC 81 MG tablet [id number] Yes Take by mouth daily. [provider]  Active   becaplermin (REGRANEX) 0.01 % gel [id number] Yes Apply 1 application topically daily. Apply daily to wound bed, cover with sterile gauze McDonald, Stephan Minister, DPM  Active   empagliflozin (JARDIANCE) 10 MG TABS tablet [id number] Yes Take 1 tablet (10 mg total) by mouth daily before breakfast. Marin Olp, MD  Active   gabapentin (NEURONTIN) 300 MG capsule [id number] Yes Take 2 capsules three times a day. Max for his renal function. Marin Olp, MD  Active   lisinopril (ZESTRIL) 20 MG tablet [id number] Yes Take 1 tablet (20 mg total) by mouth daily. Marin Olp, MD  Active   Lysine HCl 1000 MG TABS [id number] Yes Take 1,000 mg by mouth daily. [provider]  Active Self  Magnesium 400 MG TABS [id number] Yes Take 250 mg by mouth daily. [provider]  Active Self  metFORMIN (GLUCOPHAGE) 1000 MG tablet [id number] Yes TAKE 1 TABLET BY MOUTH TWICE DAILY WITH MEALS Marin Olp, MD   Active   rosuvastatin (CRESTOR) 10 MG tablet [id number] Yes Take 1 tablet (10 mg total) by mouth daily. Marin Olp, MD  Active   valACYclovir (VALTREX) 1000 MG tablet [id number]  TAKE TWO TABLETS BY MOUTH TWICE DAILY FOR 1 DAY AS NEEDED Marin Olp, MD  Active   zolpidem (AMBIEN) 5 MG tablet [id number] Yes TAKE 1 TABLET BY MOUTH AT BEDTIME AS NEEDED FOR SLEEP Marin Olp, MD  Active           Patient Active Problem List   Diagnosis Date Noted  . Aortic atherosclerosis ([place]) 06/25/2020  . Insomnia 11/08/2018  . CAD (coronary artery disease) 11/08/2018  . Right groin hernia 10/24/2016  . Basal cell carcinoma of  skin 10/28/2015  . Fever blister 08/10/2015  . Diabetes mellitus type II, controlled ([place]) 09/23/2013  . Idiopathic peripheral neuropathy 01/27/2009  . Essential hypertension 03/26/2008  . Hyperlipidemia 06/21/2007  . History of colonic polyps 06/21/2007    Immunization History  Administered Date(s) Administered  . Fluad Quad(high Dose 65+) 10/16/2019, 12/02/2020  . Influenza, High Dose Seasonal PF 10/24/2016, 02/12/2018, 11/08/2018  . Influenza,inj,Quad PF,6+ Mos 09/23/2013, 11/27/2014, 10/28/2015  . PFIZER(Purple Top)SARS-COV-2 Vaccination 01/30/2020, 02/24/2020, 10/02/2020  . Pneumococcal Conjugate-13 04/29/2015  . Pneumococcal Polysaccharide-23 09/20/2012  . Td 05/26/2008  . Tdap 05/26/2008, 01/17/2020  . Zoster, Live 09/20/2012   Conditions to be addressed/monitored: HTN DMII HLD CAD Insomnia Idopathic Neuropathy   Care Plan : [place]  Updates made by Madelin Rear, [facility] since 05/24/2021 12:00 AM    Problem: HTN DMII HLD CAD Insomnia Idopathic Neuropathy   Priority: High    Long-Range Goal: Disease Management   Start Date: 05/24/2021  Expected End Date: 05/24/2022  This Visit's Progress: On track  Priority: High  Note:   Current Barriers:  . Unable to maintain control of DM II  Pharmacist Clinical Goal(s):  Marland Kitchen Patient will  contact provider office for questions/concerns as evidenced notation of same in electronic health record through collaboration with PharmD and provider.   Interventions: . 1:1 collaboration with Marin Olp, MD regarding development and update of comprehensive plan of care as evidenced by provider attestation and co-signature . Inter-disciplinary care team collaboration (see longitudinal plan of care) . Comprehensive medication review performed; medication list updated in electronic medical record . No Rx changes   Hypertension (BP goal <130/80) -Controlled -Current treatment: . Lisinopril 20 mg once daily -Current home readings: none provided -Denies hypotensive/hypertensive symptoms -Educated on Daily salt intake goal < 2300 mg; Symptoms of hypotension and importance of maintaining adequate hydration; -Counseled on diet and exercise extensively Recommended to continue current medication  Hyperlipidemia: (LDL goal < 70) -Not ideally controlled -CAD, DM, HTN, HLD -Current treatment: . Rosuvastatin 10 mg once daily (03/2021 start) -Medications previously tried: lovastatin  -Educated on Cholesterol goals;  Benefits of statin for ASCVD risk reduction; -Recommended to continue current medication  Diabetes (A1c goal <7%) -Not ideally controlled. a1c 7.5% 03/2021 -Current medications: Marland Kitchen Metformin 1000 mg twice daily with meals  . Jardiance 10 mg once daily -Current home glucose readings . fasting glucose: not testing based off of preference, not limited by neuropathy -Denies hypoglycemic/hyperglycemic symptoms -Current meal patterns: intermittent fasting. Likes sweets. -Current exercise: walking limited by sore on foot -Educated on A1c and blood sugar goals; -Counseled to check feet daily and get yearly eye exams -Recommended to continue current medication Assessed patient finances. Will try for Jardiance PAP  Insomnia (Goal: optimize sleep hygiene) -Controlled -Current  treatment  . Zoldipem 5 mg once daily  -Reviewed side effects - no problems noted -Recommended to continue current medication Further review at 2 month f/u  Patient Goals/Self-Care Activities . Patient will:  -complete PAP application for Jardiance  Follow Up Plan: RPH f/u 2 months - review PAP Medication Assistance: Application for Jardiance  medication assistance program. in process.  Anticipated assistance start date 05/2021 if approved.    Patient's preferred pharmacy is:  [facility], [address] [place] [address] Phone: [phone] Fax: [phone]  Uses pill box? Yes  Follow Up:  Patient agrees to Care Plan and Follow-up.  Plan: CPA to complete Jardiance PAP, rph  2 month f/u & discuss dietary changes including minimization of sweets, less soda and more water.  Future Appointments  Date Time Provider [place]  06/16/2021  2:15 PM Criselda Peaches, DPM [facility]  07/20/2021 10:30 AM [facility] CCM PHARMACIST [facility]  08/25/2021  2:40 PM Marin Olp, MD [facility]  03/03/2022  2:30 PM [facility] [place], PharmD, CPP Clinical Pharmacist Practitioner  [facility]  [phone]

## 2021-05-26 ENCOUNTER — Telehealth: Payer: Self-pay

## 2021-05-26 NOTE — Progress Notes (Signed)
    Chronic Care Management Pharmacy Assistant    Medications: Outpatient Encounter Medications as of 05/26/2021  Medication Sig  . aspirin EC 81 MG tablet Take by mouth daily.  . becaplermin (REGRANEX) 0.01 % gel Apply 1 application topically daily. Apply daily to wound bed, cover with sterile gauze  . empagliflozin (JARDIANCE) 10 MG TABS tablet Take 1 tablet (10 mg total) by mouth daily before breakfast.  . gabapentin (NEURONTIN) 300 MG capsule Take 2 capsules three times a day. Max for his renal function.  Marland Kitchen lisinopril (ZESTRIL) 20 MG tablet Take 1 tablet (20 mg total) by mouth daily.  Marland Kitchen Lysine HCl 1000 MG TABS Take 1,000 mg by mouth daily.  . Magnesium 400 MG TABS Take 250 mg by mouth daily.  . metFORMIN (GLUCOPHAGE) 1000 MG tablet TAKE 1 TABLET BY MOUTH TWICE DAILY WITH MEALS  . rosuvastatin (CRESTOR) 10 MG tablet Take 1 tablet (10 mg total) by mouth daily.  . valACYclovir (VALTREX) 1000 MG tablet TAKE TWO TABLETS BY MOUTH TWICE DAILY FOR 1 DAY AS NEEDED  . zolpidem (AMBIEN) 5 MG tablet TAKE 1 TABLET BY MOUTH AT BEDTIME AS NEEDED FOR SLEEP   No facility-administered encounter medications on file as of 05/26/2021.    Completed PAP for Jardiance 10mg .  Mailed application to patients home address with return envelope.  Left message for patient to call me back to inform.  05/27/21 Patient informed that application was mailed.  Fill out the highlighted sections and mail back to Dr Ronney Lion office attn Edison Nasuti. Patient understood.  [name]

## 2021-06-09 DIAGNOSIS — D1801 Hemangioma of skin and subcutaneous tissue: Secondary | ICD-10-CM | POA: Diagnosis not present

## 2021-06-09 DIAGNOSIS — L821 Other seborrheic keratosis: Secondary | ICD-10-CM | POA: Diagnosis not present

## 2021-06-09 DIAGNOSIS — L905 Scar conditions and fibrosis of skin: Secondary | ICD-10-CM | POA: Diagnosis not present

## 2021-06-13 ENCOUNTER — Ambulatory Visit: Payer: Medicare Other | Admitting: Podiatry

## 2021-06-13 ENCOUNTER — Encounter: Payer: Self-pay | Admitting: Podiatry

## 2021-06-13 ENCOUNTER — Other Ambulatory Visit: Payer: Self-pay

## 2021-06-13 DIAGNOSIS — E1142 Type 2 diabetes mellitus with diabetic polyneuropathy: Secondary | ICD-10-CM | POA: Diagnosis not present

## 2021-06-13 DIAGNOSIS — L97511 Non-pressure chronic ulcer of other part of right foot limited to breakdown of skin: Secondary | ICD-10-CM

## 2021-06-13 NOTE — Progress Notes (Signed)
  Subjective:  Patient ID: William Brewer, male    DOB: February 28, 1946,  MRN: [id number]  Chief Complaint  Patient presents with   Follow-up    3 week wound follow up    75 y.o. male returns with the above complaint. History confirmed with patient.  Doing well  Objective:  Physical Exam: warm, good capillary refill, normal DP and PT pulses and absent light touch and protective sensation bilateral plantar foot.  Onychomycosis x10 with significant brown discoloration of the left hallux and second toe  Right Foot: Ulcer has healed    Assessment:   1. Chronic ulcer of great toe of right foot, limited to breakdown of skin ([place])   2. Type 2 diabetes mellitus with polyneuropathy ([place])      Plan:  Patient was evaluated and treated and all questions answered.  Ulcer right hallux -Doing well ulcer is healed can leave open to air and apply lotion daily.  Will let me know if it recurs.  Follow-up in 2 months for at risk diabetic foot care to get him back on a routine quarterly schedule  .   Return in about 2 months (around 08/13/2021).

## 2021-06-16 ENCOUNTER — Ambulatory Visit: Payer: Medicare Other | Admitting: Podiatry

## 2021-06-21 DIAGNOSIS — E119 Type 2 diabetes mellitus without complications: Secondary | ICD-10-CM | POA: Diagnosis not present

## 2021-06-21 LAB — HM DIABETES EYE EXAM

## 2021-06-23 ENCOUNTER — Telehealth: Payer: Self-pay

## 2021-06-23 NOTE — Chronic Care Management (AMB) (Signed)
    Chronic Care Management Pharmacy Assistant   Name: William Brewer  MRN: [id number] DOB: [birth year]  Reason for Encounter: Chart Review   Medications: Outpatient Encounter Medications as of 06/23/2021  Medication Sig   aspirin EC 81 MG tablet Take by mouth daily.   becaplermin (REGRANEX) 0.01 % gel Apply 1 application topically daily. Apply daily to wound bed, cover with sterile gauze   empagliflozin (JARDIANCE) 10 MG TABS tablet Take 1 tablet (10 mg total) by mouth daily before breakfast.   gabapentin (NEURONTIN) 300 MG capsule Take 2 capsules three times a day. Max for his renal function.   lisinopril (ZESTRIL) 20 MG tablet Take 1 tablet (20 mg total) by mouth daily.   Lysine HCl 1000 MG TABS Take 1,000 mg by mouth daily.   Magnesium 400 MG TABS Take 250 mg by mouth daily.   metFORMIN (GLUCOPHAGE) 1000 MG tablet TAKE 1 TABLET BY MOUTH TWICE DAILY WITH MEALS   rosuvastatin (CRESTOR) 10 MG tablet Take 1 tablet (10 mg total) by mouth daily.   valACYclovir (VALTREX) 1000 MG tablet TAKE TWO TABLETS BY MOUTH TWICE DAILY FOR 1 DAY AS NEEDED   zolpidem (AMBIEN) 5 MG tablet TAKE 1 TABLET BY MOUTH AT BEDTIME AS NEEDED FOR SLEEP   No facility-administered encounter medications on file as of 06/23/2021.   Reviewed chart for medication changes and adherence.   Recent OV, Consult or Hospital visit:  06/13/21- Lanae Crumbly, DPM (Podiatry)- 3 week wound follow up, no changes  No medication changes indicated  No gaps in adherence identified. Patient has follow up scheduled with pharmacy team. No further action required.  Wilford Sports CPA, CMA

## 2021-07-04 ENCOUNTER — Other Ambulatory Visit: Payer: Self-pay | Admitting: Family Medicine

## 2021-07-14 ENCOUNTER — Telehealth (INDEPENDENT_AMBULATORY_CARE_PROVIDER_SITE_OTHER): Payer: Medicare Other | Admitting: Family Medicine

## 2021-07-14 DIAGNOSIS — U071 COVID-19: Secondary | ICD-10-CM

## 2021-07-14 MED ORDER — NIRMATRELVIR/RITONAVIR (PAXLOVID)TABLET: 3.0000 | ORAL_TABLET | Freq: Two times a day (BID) | ORAL | 0 refills | Status: AC

## 2021-07-14 MED ORDER — BENZONATATE 100 MG PO CAPS: 100.0000 mg | ORAL_CAPSULE | Freq: Three times a day (TID) | ORAL | 0 refills | Status: DC | PRN

## 2021-07-14 NOTE — Patient Instructions (Addendum)
HOME CARE TIPS:  -[place] testing information: https://www.rivera-powers.org/ OR [phone] Most pharmacies also offer testing and home test kits. If the Covid19 test is positive, please make a prompt follow up visit with your primary care office or with [facility] to discuss treatment options. Treatments for Covid19 are best given early in the course of the illness.   -I sent the medication(s) we discussed to your pharmacy: Meds ordered this encounter  Medications   nirmatrelvir/ritonavir EUA (PAXLOVID) TABS    Sig: Take 3 tablets by mouth 2 (two) times daily for 5 days. (Take nirmatrelvir 150 mg two tablets twice daily for 5 days and ritonavir 100 mg one tablet twice daily for 5 days) Patient GFR is 76    Dispense:  30 tablet    Refill:  0   benzonatate (TESSALON PERLES) 100 MG capsule    Sig: Take 1 capsule (100 mg total) by mouth 3 (three) times daily as needed.    Dispense:  20 capsule    Refill:  0     -I sent in the [facility] treatment or referral you requested per our discussion. Please see the information provided below and discuss further with the pharmacist/treatment team.  Please make the following adjustments to your other medications while taking Paxlovid: -DO NOT take your cholesterol medication (crestor/rosuvastatin) while taking Paxlovid -take 1/2 dose of your ambien and/or monitor for increased sedation while taking Paxlovid -DO NOT use over the counter medications or supplements while taking th ePaxlovid without first talking with your pharmacist or doctor  -can use tylenol  if needed for fevers, aches and pains per instructions  -can use nasal saline a few times per day if you have nasal congestion; sometimes  a short course of Afrin nasal spray for 3 days can help with symptoms as well  -stay hydrated, drink plenty of fluids and eat small healthy meals - avoid dairy   -follow up with your doctor in 2-3 days unless improving  and feeling better  -stay home while sick, except to seek medical care. If you have COVID19, ideally it would be best to stay home for a full 10 days since the onset of symptoms PLUS one day of no fever and feeling better. Wear a good mask that fits snugly (such as N95 or KN95) if around others to reduce the risk of transmission.  It was nice to meet you today, and I really hope you are feeling better soon. I help [facility] out with telemedicine visits on Tuesdays and Thursdays and am available for visits on those days. If you have any concerns or questions following this visit please schedule a follow up visit with your Primary Care doctor or seek care at a local urgent care clinic to avoid delays in care.    Seek in person care or schedule a follow up video visit promptly if your symptoms worsen, new concerns arise or you are not improving with treatment. Call 911 and/or seek emergency care if your symptoms are severe or life threatening.  FACT SHEET FOR PATIENTS, PARENTS, AND CAREGIVERS EMERGENCY USE AUTHORIZATION (EUA) OF PAXLOVID FOR CORONAVIRUS DISEASE 2019 (COVID-19) You are being given this Fact Sheet because your healthcare provider believes it is necessary to provide you with PAXLOVID for the treatment of mild-to-moderate coronavirus disease (COVID-19) caused by the SARS-CoV-2 virus. This Fact Sheet contains information to help you understand the risks and benefits of taking the PAXLOVID you have received or may receive. The U.S. Food and Drug Administration (FDA) has  issued an Emergency Use Authorization (EUA) to make PAXLOVID available during the COVID-19 pandemic (for more details about an EUA please see "What is an Emergency Use Authorization?" at the end of this document). PAXLOVID is not an FDA-approved medicine in the [place]. Read this Fact Sheet for information about PAXLOVID. Talk to your healthcare provider about your options or if you have any questions. It is your  choice to take PAXLOVID.  What is COVID-19? COVID-19 is caused by a virus called a coronavirus. You can get COVID-19 through close contact with another person who has the virus. COVID-19 illnesses have ranged from very mild-to-severe, including illness resulting in death. While information so far suggests that most COVID-19 illness is mild, serious illness can happen and may cause some of your other medical conditions to become worse. Older people and people of all ages with severe, long lasting (chronic) medical conditions like heart disease, lung disease, and diabetes, for example seem to be at higher risk of being hospitalized for COVID-19.  What is PAXLOVID? PAXLOVID is an investigational medicine used to treat mild-to-moderate COVID-19 in adults and children [49 years of age and older weighing at least 27 pounds (76 kg)] with positive results of direct SARS-CoV-2 viral testing, and who are at high risk for progression to severe COVID-19, including hospitalization or death. PAXLOVID is investigational because it is still being studied. There is limited information about the safety and effectiveness of using PAXLOVID to treat people with mild-to-moderate COVID-19.  The FDA has authorized the emergency use of PAXLOVID for the treatment of mild-tomoderate COVID-19 in adults and children [16 years of age and older weighing at least 21 pounds (6 kg)] with a positive test for the virus that causes COVID-19, and who are at high risk for progression to severe COVID-19, including hospitalization or death, under an EUA. 1 Revised: 11 March 2021   What should I tell my healthcare provider before I take PAXLOVID? Tell your healthcare provider if you: ? Have any allergies ? Have liver or kidney disease ? Are pregnant or plan to become pregnant ? Are breastfeeding a child ? Have any serious illnesses  Tell your healthcare provider about all the medicines you take, including prescription  and over-the-counter medicines, vitamins, and herbal supplements. Some medicines may interact with PAXLOVID and may cause serious side effects. Keep a list of your medicines to show your healthcare provider and pharmacist when you get a new medicine.  You can ask your healthcare provider or pharmacist for a list of medicines that interact with PAXLOVID. Do not start taking a new medicine without telling your healthcare provider. Your healthcare provider can tell you if it is safe to take PAXLOVID with other medicines.  Tell your healthcare provider if you are taking combined hormonal contraceptive. PAXLOVID may affect how your birth control pills work. Females who are able to become pregnant should use another effective alternative form of contraception or an additional barrier method of contraception. Talk to your healthcare provider if you have any questions about contraceptive methods that might be right for you.  How do I take PAXLOVID? ? PAXLOVID consists of 2 medicines: nirmatrelvir and ritonavir. o Take 2 pink tablets of nirmatrelvir with 1 white tablet of ritonavir by mouth 2 times each day (in the morning and in the evening) for 5 days. For each dose, take all 3 tablets at the same time. o If you have kidney disease, talk to your healthcare provider. You may need a different dose. ?  Swallow the tablets whole. Do not chew, break, or crush the tablets. ? Take PAXLOVID with or without food. ? Do not stop taking PAXLOVID without talking to your healthcare provider, even if you feel better. ? If you miss a dose of PAXLOVID within 8 hours of the time it is usually taken, take it as soon as you remember. If you miss a dose by more than 8 hours, skip the missed dose and take the next dose at your regular time. Do not take 2 doses of PAXLOVID at the same time. ? If you take too much PAXLOVID, call your healthcare provider or go to the nearest hospital emergency room right away. ? If  you are taking a ritonavir- or cobicistat-containing medicine to treat hepatitis C or Human Immunodeficiency Virus (HIV), you should continue to take your medicine as prescribed by your healthcare provider. 2 Revised: 11 March 2021    Talk to your healthcare provider if you do not feel better or if you feel worse after 5 days.  Who should generally not take PAXLOVID? Do not take PAXLOVID if: ? You are allergic to nirmatrelvir, ritonavir, or any of the ingredients in PAXLOVID. ? You are taking any of the following medicines: o Alfuzosin o Pethidine, propoxyphene o Ranolazine o Amiodarone, dronedarone, flecainide, propafenone, quinidine o Colchicine o Lurasidone, pimozide, clozapine o Dihydroergotamine, ergotamine, methylergonovine o Lovastatin, simvastatin o Sildenafil (Revatio) for pulmonary arterial hypertension (PAH) o Triazolam, oral midazolam o Apalutamide o Carbamazepine, phenobarbital, phenytoin o Rifampin o St. John's Wort (hypericum perforatum) Taking PAXLOVID with these medicines may cause serious or life-threatening side effects or affect how PAXLOVID works.  These are not the only medicines that may cause serious side effects if taken with PAXLOVID. PAXLOVID may increase or decrease the levels of multiple other medicines. It is very important to tell your healthcare provider about all of the medicines you are taking because additional laboratory tests or changes in the dose of your other medicines may be necessary while you are taking PAXLOVID. Your healthcare provider may also tell you about specific symptoms to watch out for that may indicate that you need to stop or decrease the dose of some of your other medicines.  What are the important possible side effects of PAXLOVID? Possible side effects of PAXLOVID are: ? Allergic Reactions. Allergic reactions can happen in people taking PAXLOVID, even after only 1 dose. Stop taking PAXLOVID and call your healthcare  provider right away if you get any of the following symptoms of an allergic reaction: o hives o trouble swallowing or breathing o swelling of the mouth, lips, or face o throat tightness o hoarseness 3 Revised: 11 March 2021  o skin rash ? Liver Problems. Tell your healthcare provider right away if you have any of these signs and symptoms of liver problems: loss of appetite, yellowing of your skin and the whites of eyes (jaundice), dark-colored urine, pale colored stools and itchy skin, stomach area (abdominal) pain. ? Resistance to HIV Medicines. If you have untreated HIV infection, PAXLOVID may lead to some HIV medicines not working as well in the future. ? Other possible side effects include: o altered sense of taste o diarrhea o high blood pressure o muscle aches These are not all the possible side effects of PAXLOVID. Not many people have taken PAXLOVID. Serious and unexpected side effects may happen. PAXLOVID is still being studied, so it is possible that all of the risks are not known at this time.  What other treatment  choices are there? Veklury (remdesivir) is FDA-approved for the treatment of mild-to-moderate JQBHA-19 in certain adults and children. Talk with your doctor to see if Marijean Heath is appropriate for you. Like PAXLOVID, FDA may also allow for the emergency use of other medicines to treat people with COVID-19. Go to https://price.info/ for information on the emergency use of other medicines that are authorized by FDA to treat people with COVID-19. Your healthcare provider may talk with you about clinical trials for which you may be eligible. It is your choice to be treated or not to be treated with PAXLOVID. Should you decide not to receive it or for your child not to receive it, it will not change your standard medical care.  What if I am pregnant or  breastfeeding? There is no experience treating pregnant women or breastfeeding mothers with PAXLOVID. For a mother and unborn baby, the benefit of taking PAXLOVID may be greater than the risk from the treatment. If you are pregnant, discuss your options and specific situation with your healthcare provider. It is recommended that you use effective barrier contraception or do not have sexual activity while taking PAXLOVID. If you are breastfeeding, discuss your options and specific situation with your healthcare provider. 4 Revised: 11 March 2021   How do I report side effects with PAXLOVID? Contact your healthcare provider if you have any side effects that bother you or do not go away. Report side effects to FDA MedWatch at SmoothHits.hu or call 1-800-FDA1088 or you can report side effects to [employer]. at the contact information provided below. Website Fax number Telephone number www.pfizersafetyreporting.com ([phone] How should I store [place]? Store PAXLOVID tablets at room temperature, between 68?F to 77?F (20?C to 25?C). How can I learn more about COVID-19? ? Ask your healthcare provider. ? Visit https://jacobson-johnson.com/. ? Contact your local or state public health department. What is an Emergency Use Authorization (EUA)? The [place] FDA has made PAXLOVID available under an emergency access mechanism called an Emergency Use Authorization (EUA). The EUA is supported by a Education officer, museum and Human Service (HHS) declaration that circumstances exist to justify the emergency use of drugs and biological products during the COVID-19 pandemic. PAXLOVID for the treatment of mild-to-moderate COVID-19 in adults and children [79 years of age and older weighing at least 70 pounds (69 kg)] with positive results of direct SARS-CoV-2 viral testing, and who are at high risk for progression to severe COVID-19, including hospitalization or death, has not  undergone the same type of review as an FDA-approved product. In issuing an EUA under the HDQQI-29 public health emergency, the FDA has determined, among other things, that based on the total amount of scientific evidence available including data from adequate and well-controlled clinical trials, if available, it is reasonable to believe that the product may be effective for diagnosing, treating, or preventing COVID-19, or a serious or life-threatening disease or condition caused by COVID-19; that the known and potential benefits of the product, when used to diagnose, treat, or prevent such disease or condition, outweigh the known and potential risks of such product; and that there are no adequate, approved, and available alternatives. All of these criteria must be met to allow for the product to be used in the treatment of patients during the COVID-19 pandemic. The EUA for PAXLOVID is in effect for the duration of the COVID-19 declaration justifying emergency use of this product, unless terminated or revoked (after which the products may no longer be used under the EUA).  5 Revised: 11 March 2021     Additional Information For general questions, visit the website or call the telephone number provided below. Website Telephone number www.COVID19oralRx.com [phone] (1-877-C19-PACK) You can also go to www.pfizermedinfo.com or call [phone] for more information. [id number] Revised: 11 March 2021

## 2021-07-14 NOTE — Progress Notes (Signed)
Virtual Visit via Video Note  I connected with William Brewer  on 07/14/21 at 10:20 AM EDT by a video enabled telemedicine application and verified that I am speaking with the correct person using two identifiers.  Location patient: home, [place] Location provider:work or home office Persons participating in the virtual visit: patient, provider  I discussed the limitations of evaluation and management by telemedicine and the availability of in person appointments. The patient expressed understanding and agreed to proceed.   HPI:  Acute telemedicine visit for Covid19: -Onset: 2 days ago; home test was positive yesterday -Symptoms include: felt like was getting a "head cold", nasal congestion, cough, sore throat -Denies: fever, CP, SOB, NVD, body aches, inability to eat/drink/get out of bed -Has tried: nothing -Pertinent past medical history:see PMH below -Pertinent medication allergies: No Active Allergies -COVID-19 vaccine status: had 2 doses and 2 booster -had labs in April with gfr 76, denies any hx of renal or hepatic impairment  ROS: See pertinent positives and negatives per HPI.  Past Medical History:  Diagnosis Date   Allergy    Basal cell carcinoma    leg   Colon polyps    Diabetes mellitus without complication ([place])    DIVERTICULOSIS, COLON 05/26/2008   Hyperlipidemia    refused treatment   Hypertension    Peripheral neuropathy     Past Surgical History:  Procedure Laterality Date   APPENDECTOMY     COLONOSCOPY     LEFT HEART CATH AND CORONARY ANGIOGRAPHY N/A 03/26/2018   Procedure: LEFT HEART CATH AND CORONARY ANGIOGRAPHY;  Surgeon: Jettie Booze, MD;  Location: [facility];  Service: Cardiovascular;  Laterality: N/A;   POLYPECTOMY     ULTRASOUND GUIDANCE FOR VASCULAR ACCESS  03/26/2018   Procedure: Ultrasound Guidance For Vascular Access;  Surgeon: Jettie Booze, MD;  Location: [facility];  Service: Cardiovascular;;     Current Outpatient  Medications:    benzonatate (TESSALON PERLES) 100 MG capsule, Take 1 capsule (100 mg total) by mouth 3 (three) times daily as needed., Disp: 20 capsule, Rfl: 0   nirmatrelvir/ritonavir EUA (PAXLOVID) TABS, Take 3 tablets by mouth 2 (two) times daily for 5 days. (Take nirmatrelvir 150 mg two tablets twice daily for 5 days and ritonavir 100 mg one tablet twice daily for 5 days) Patient GFR is 76, Disp: 30 tablet, Rfl: 0   aspirin EC 81 MG tablet, Take by mouth daily., Disp: , Rfl:    becaplermin (REGRANEX) 0.01 % gel, Apply 1 application topically daily. Apply daily to wound bed, cover with sterile gauze, Disp: 15 g, Rfl: 0   empagliflozin (JARDIANCE) 10 MG TABS tablet, Take 1 tablet (10 mg total) by mouth daily before breakfast., Disp: 90 tablet, Rfl: 3   gabapentin (NEURONTIN) 300 MG capsule, Take 2 capsules three times a day. Max for his renal function., Disp: 540 capsule, Rfl: 3   lisinopril (ZESTRIL) 20 MG tablet, Take 1 tablet by mouth once daily, Disp: 90 tablet, Rfl: 0   Lysine HCl 1000 MG TABS, Take 1,000 mg by mouth daily., Disp: , Rfl:    Magnesium 400 MG TABS, Take 250 mg by mouth daily., Disp: , Rfl:    metFORMIN (GLUCOPHAGE) 1000 MG tablet, TAKE 1 TABLET BY MOUTH TWICE DAILY WITH MEALS, Disp: 180 tablet, Rfl: 0   rosuvastatin (CRESTOR) 10 MG tablet, Take 1 tablet (10 mg total) by mouth daily., Disp: 90 tablet, Rfl: 3   valACYclovir (VALTREX) 1000 MG tablet, TAKE TWO TABLETS  BY MOUTH TWICE DAILY FOR 1 DAY AS NEEDED, Disp: 20 tablet, Rfl: 3   zolpidem (AMBIEN) 5 MG tablet, TAKE 1 TABLET BY MOUTH AT BEDTIME AS NEEDED FOR SLEEP, Disp: 30 tablet, Rfl: 5  EXAM:  VITALS per patient if applicable:  GENERAL: alert, oriented, appears well and in no acute distress  HEENT: atraumatic, conjunttiva clear, no obvious abnormalities on inspection of external nose and ears  NECK: normal movements of the head and neck  LUNGS: on inspection no signs of respiratory distress, breathing rate appears  normal, no obvious gross SOB, gasping or wheezing  CV: no obvious cyanosis  MS: moves all visible extremities without noticeable abnormality  PSYCH/NEURO: pleasant and cooperative, no obvious depression or anxiety, speech and thought processing grossly intact  ASSESSMENT AND PLAN:  Discussed the following assessment and plan:  COVID-19   Discussed treatment options, ideal treatment window, potential complications, isolation and precautions for COVID-19.  After lengthy discussion, the patient opted for treatment with Paxlovid due to being higher risk for complications of covid or severe disease and other factors. Discussed EUA status of this drug and the fact that there is preliminary limited knowledge of risks/interactions/side effects per EUA document vs possible benefits and precautions. He agrees to hold his statin, take 1/2 dose of his ambien and monitor for sedation during paxlovid use and also avoid supplements/OTCs unless discussing with pharmacy or PCP prior to use. This information was shared with patient during the visit and also was provided in patient instructions. Also, advised that patient discuss risks/interactions and use with pharmacist/treatment team as well.  The patient did want a prescription for cough, Tessalon Rx sent.  Other symptomatic care measures summarized in patient instructions.  Work/School slipped offered: declined  Advised to seek prompt in person care if worsening, new symptoms arise, or if is not improving with treatment. Discussed options for inperson care if PCP office not available. Did let this patient know that I only do telemedicine on Tuesdays and Thursdays for [place]. Advised to schedule follow up visit with PCP or [facility] if any further questions or concerns to avoid delays in care.   I discussed the assessment and treatment plan with the patient. The patient was provided an opportunity to ask questions and all were answered. The patient agreed with the  plan and demonstrated an understanding of the instructions.     Lucretia Kern, DO

## 2021-07-15 ENCOUNTER — Telehealth: Payer: Self-pay | Admitting: Pharmacist

## 2021-07-15 ENCOUNTER — Telehealth: Payer: Self-pay

## 2021-07-15 NOTE — Chronic Care Management (AMB) (Addendum)
    Chronic Care Management Pharmacy Assistant   Name: William Brewer  MRN: [id number] DOB: [birth year]  Reason for Encounter: General Adherence Disease State Call  Recent office visits:  None  Recent consult visits:  None  Hospital visits:  None in previous 6 months  Medications: Outpatient Encounter Medications as of 07/15/2021  Medication Sig   aspirin EC 81 MG tablet Take by mouth daily.   becaplermin (REGRANEX) 0.01 % gel Apply 1 application topically daily. Apply daily to wound bed, cover with sterile gauze   benzonatate (TESSALON PERLES) 100 MG capsule Take 1 capsule (100 mg total) by mouth 3 (three) times daily as needed.   empagliflozin (JARDIANCE) 10 MG TABS tablet Take 1 tablet (10 mg total) by mouth daily before breakfast.   gabapentin (NEURONTIN) 300 MG capsule Take 2 capsules three times a day. Max for his renal function.   lisinopril (ZESTRIL) 20 MG tablet Take 1 tablet by mouth once daily   Lysine HCl 1000 MG TABS Take 1,000 mg by mouth daily.   Magnesium 400 MG TABS Take 250 mg by mouth daily.   metFORMIN (GLUCOPHAGE) 1000 MG tablet TAKE 1 TABLET BY MOUTH TWICE DAILY WITH MEALS   nirmatrelvir/ritonavir EUA (PAXLOVID) TABS Take 3 tablets by mouth 2 (two) times daily for 5 days. (Take nirmatrelvir 150 mg two tablets twice daily for 5 days and ritonavir 100 mg one tablet twice daily for 5 days) Patient GFR is 76   rosuvastatin (CRESTOR) 10 MG tablet Take 1 tablet (10 mg total) by mouth daily.   valACYclovir (VALTREX) 1000 MG tablet TAKE TWO TABLETS BY MOUTH TWICE DAILY FOR 1 DAY AS NEEDED   zolpidem (AMBIEN) 5 MG tablet TAKE 1 TABLET BY MOUTH AT BEDTIME AS NEEDED FOR SLEEP   No facility-administered encounter medications on file as of 07/15/2021.    I called and spoke with the patient. He has no concerns with his overall health at this time. He has not had any medication changes.  He states he is not checking his sugars at home. Patient does not have any issues or  problems with his medications or pharmacy.   Patient states he does not have anything to pass along at this time.  Patient rescheduled his follow up telephone appointment with clinical pharmacist on 08/23/2021 at 9:00 am.  Future Appointments  Date Time Provider [place]  08/16/2021 11:15 AM Criselda Peaches, DPM [facility]  08/23/2021  9:00 AM [facility] CCM PHARMACIST [facility]  08/25/2021  2:40 PM Marin Olp, MD [facility]  03/03/2022  2:30 PM [facility] HEALTH COACH [facility]     Star Rating Drugs: Jardiance 10 mg last filled 07/06/2021 90 DS Lisinopril 20 mg last filled 07/04/2021 90 DS Rosuvastatin 10 mg last filled 06/22/2021 90 DS Metformin 1000 mg last filled 07/04/2021 90 DS   April D Calhoun, [facility] Pharmacist Assistant [phone]   5 minutes spent in review, coordination, and documentation.  Reviewed by: Beverly Milch, PharmD Clinical Pharmacist [phone]

## 2021-07-15 NOTE — Telephone Encounter (Signed)
Patient was seen virtually by Dr. Maudie Mercury on 07/14/21.  Patient tested positive for COVID on 07/13/21.    Patient is scheduled to leave for a cruise on 07/26/21.  Is requesting a letter addressed to [employer]  The letter needs to states that patient was seen virtually by Dr. Maudie Mercury on 7/21 and started treatment on 7/21 for COVID.   Is requesting letter to be sent to patient via mychart.    Please follow up with patient once letter has been completed.    Is request to be completed as soon as possible.

## 2021-07-19 NOTE — Telephone Encounter (Signed)
Is this okay?

## 2021-07-19 NOTE — Telephone Encounter (Signed)
Has he been fever free for over 24 hours without fever reducing medicine and had significant improvement in respiratory symptoms without recent worsening?

## 2021-07-20 ENCOUNTER — Telehealth: Payer: Medicare Other

## 2021-07-20 NOTE — Telephone Encounter (Signed)
Called and spoke with pt and pt states he never had a fever and his respiratory symptoms are fine.

## 2021-07-20 NOTE — Telephone Encounter (Signed)
You may write letter  To whom it may concern,  Mr. Merrick is a patient of mine.  He was seen by my colleague Dr. Maudie Mercury in regards to covid-19. He was seen virtually by Dr. Maudie Mercury on 07/14/21 and started treatment on 07/14/21 for COVID.  He has never had a fever and has had resolution of respiratory symptoms.  Patient may travel on cruise with [employer] on July 26, 2021.  Thanks, Garret Reddish, MD

## 2021-07-20 NOTE — Telephone Encounter (Signed)
Called and lm on pt vm making pt aware letter ready in mychart.

## 2021-08-16 ENCOUNTER — Other Ambulatory Visit: Payer: Self-pay

## 2021-08-16 ENCOUNTER — Ambulatory Visit: Payer: Medicare Other | Admitting: Podiatry

## 2021-08-16 DIAGNOSIS — L97511 Non-pressure chronic ulcer of other part of right foot limited to breakdown of skin: Secondary | ICD-10-CM | POA: Diagnosis not present

## 2021-08-16 DIAGNOSIS — L84 Corns and callosities: Secondary | ICD-10-CM | POA: Diagnosis not present

## 2021-08-16 DIAGNOSIS — M79674 Pain in right toe(s): Secondary | ICD-10-CM

## 2021-08-16 DIAGNOSIS — B351 Tinea unguium: Secondary | ICD-10-CM | POA: Diagnosis not present

## 2021-08-16 DIAGNOSIS — E1142 Type 2 diabetes mellitus with diabetic polyneuropathy: Secondary | ICD-10-CM | POA: Diagnosis not present

## 2021-08-16 DIAGNOSIS — M79675 Pain in left toe(s): Secondary | ICD-10-CM | POA: Diagnosis not present

## 2021-08-16 NOTE — Progress Notes (Signed)
  Subjective:  Patient ID: William Brewer, male    DOB: 08-04-1946,  MRN: [id number]  Chief Complaint  Patient presents with   Diabetic Ulcer    Chronic ulcer of right great toe, 2 month follow up, A1C  7.5    75 y.o. male returns with the above complaint. History confirmed with patient.  Doing well the ulcer remains healed  Objective:  Physical Exam: warm, good capillary refill, normal DP and PT pulses and absent light touch and protective sensation bilateral plantar foot.  Onychomycosis x10 with significant brown discoloration of the left hallux and second toe  Right Foot: Ulcer has healed now callus is in this place    Assessment:   1. Chronic ulcer of great toe of right foot, limited to breakdown of skin ([place])   2. Type 2 diabetes mellitus with polyneuropathy (HCC)   3. Pain due to onychomycosis of toenails of both feet   4. Callus of foot      Plan:  Patient was evaluated and treated and all questions answered.  Ulcer right hallux All symptomatic hyperkeratoses were safely debrided with a sterile #15 blade to patient's level of comfort without incident. We discussed preventative and palliative care of these lesions including supportive and accommodative shoegear, padding, prefabricated and custom molded accommodative orthoses, use of a pumice stone and lotions/creams daily.  Patient educated on diabetes. Discussed proper diabetic foot care and discussed risks and complications of disease. Educated patient in depth on reasons to return to the office immediately should he/she discover anything concerning or new on the feet. All questions answered. Discussed proper shoes as well.   Discussed the etiology and treatment options for the condition in detail with the patient. Educated patient on the topical and oral treatment options for mycotic nails. Recommended debridement of the nails today. Sharp and mechanical debridement performed of all painful and mycotic nails today. Nails  debrided in length and thickness using a nail nipper to level of comfort. Discussed treatment options including appropriate shoe gear. Follow up as needed for painful nails.    .   Return in about 3 months (around 11/16/2021) for at risk diabetic foot care.

## 2021-08-17 ENCOUNTER — Other Ambulatory Visit: Payer: Self-pay | Admitting: Family Medicine

## 2021-08-23 ENCOUNTER — Ambulatory Visit (INDEPENDENT_AMBULATORY_CARE_PROVIDER_SITE_OTHER): Payer: Medicare Other | Admitting: Pharmacist

## 2021-08-23 DIAGNOSIS — I10 Essential (primary) hypertension: Secondary | ICD-10-CM

## 2021-08-23 DIAGNOSIS — E1142 Type 2 diabetes mellitus with diabetic polyneuropathy: Secondary | ICD-10-CM | POA: Diagnosis not present

## 2021-08-23 NOTE — Progress Notes (Signed)
Chronic Care Management Pharmacy Note  08/23/2021 Name:  William Brewer MRN:  [id number] DOB:  [birth year]  Summary: Follow up CCM Visit  Recommendations/Changes made from today's visit: No Rx changes - recommended he comes if for labs (Updated A1c since starting Jardiance and lipids since switching to Crestor)  Plan: FU in 2 months to see if patient has scheduled labs  Subjective: William Brewer is an 75 y.o. year old male who is a primary patient of Hunter, Brayton Mars, MD.  The CCM team was consulted for assistance with disease management and care coordination needs.    Engaged with patient by telephone for initial visit in response to provider referral for pharmacy case management and/or care coordination services.   Consent to Services:  The patient was given the following information about Chronic Care Management services today, agreed to services, and gave verbal consent: 1. CCM service includes personalized support from designated clinical staff supervised by the primary care provider, including individualized plan of care and coordination with other care providers 2. 24/7 contact phone numbers for assistance for urgent and routine care needs. 3. Service will only be billed when office clinical staff spend 20 minutes or more in a month to coordinate care. 4. Only one practitioner may furnish and bill the service in a calendar month. 5.The patient may stop CCM services at any time (effective at the end of the month) by phone call to the office staff. 6. The patient will be responsible for cost sharing (co-pay) of up to 20% of the service fee (after annual deductible is met). Patient agreed to services and consent obtained.   Patient Care Team: Marin Olp, MD as PCP - General (Family Medicine) Dorothy Spark, MD (Inactive) as PCP - Cardiology (Cardiology) Juluis Rainier as Consulting Physician (Optometry) Paulla Dolly, Tamala Fothergill, DPM as Consulting Physician (Podiatry) Edythe Clarity, [facility] (Pharmacist)  Objective: Lab Results  Component Value Date   CREATININE 0.97 03/31/2021   CREATININE 0.97 12/02/2020   CREATININE 1.12 07/20/2020   Lab Results  Component Value Date   HGBA1C 7.5 (H) 03/31/2021   Last diabetic Eye exam:  Lab Results  Component Value Date/Time   HMDIABEYEEXA No Retinopathy 03/30/2020 12:00 AM    Last diabetic Foot exam: No results found for: HMDIABFOOTEX      Component Value Date/Time   CHOL 144 03/31/2021 1329   TRIG 220.0 (H) 03/31/2021 1329   HDL 40.30 03/31/2021 1329   CHOLHDL 4 03/31/2021 1329   VLDL 44.0 (H) 03/31/2021 1329   LDLCALC 59 03/02/2020 0920   LDLDIRECT 88.0 03/31/2021 1329    Hepatic Function Latest Ref Rng & Units 03/31/2021 12/02/2020 07/20/2020  Total Protein 6.0 - 8.3 g/dL 7.1 7.1 7.1  Albumin 3.5 - 5.2 g/dL 4.7 - -  AST 0 - 37 U/L 35 29 22  ALT 0 - 53 U/L 41 31 26  Alk Phosphatase 39 - 117 U/L 57 - -  Total Bilirubin 0.2 - 1.2 mg/dL 0.7 0.6 0.6  Bilirubin, Direct 0.0 - 0.3 mg/dL - - -    Lab Results  Component Value Date/Time   TSH 3.89 02/12/2018 08:45 AM   TSH 2.13 11/30/2014 10:03 AM    CBC Latest Ref Rng & Units 03/31/2021 07/20/2020 06/23/2020  WBC 4.0 - 10.5 K/uL 5.7 5.7 15.4(H)  Hemoglobin 13.0 - 17.0 g/dL 15.3 14.8 13.8  Hematocrit 39.0 - 52.0 % 44.6 45.0 41.8  Platelets 150.0 - 400.0 K/uL 172.0 193 237  No results found for: VD25OH  Clinical ASCVD:  The 10-year ASCVD risk score Mikey Bussing DC Jr., et al., 2013) is: 46.4%   Values used to calculate the score:     Age: 57 years     Sex: Male     Is Non-Hispanic African American: No     Diabetic: Yes     Tobacco smoker: No     Systolic Blood Pressure: 062 mmHg     Is BP treated: Yes     HDL Cholesterol: 40.3 mg/dL     Total Cholesterol: 144 mg/dL    Social History   Tobacco Use  Smoking Status Never  Smokeless Tobacco Never   BP Readings from Last 3 Encounters:  03/31/21 134/80  12/02/20 113/67  07/20/20 (!) 100/60   Pulse  Readings from Last 3 Encounters:  03/31/21 67  12/02/20 87  07/20/20 79   Wt Readings from Last 3 Encounters:  03/31/21 187 lb 6.4 oz (85 kg)  12/02/20 183 lb (83 kg)  10/27/20 188 lb (85.3 kg)    Assessment: Review of patient past medical history, allergies, medications, health status, including review of consultants reports, laboratory and other test data, was performed as part of comprehensive evaluation and provision of chronic care management services.   SDOH:  (Social Determinants of Health) assessments and interventions performed: Yes.   CCM Care Plan  No Active Allergies  Medications Reviewed Today     Reviewed by Criselda Peaches, DPM (Physician) on 06/13/21 at [place] [facility] Status: <None>   Medication Order Taking? Sig Documenting Provider Last Dose Status Informant  aspirin EC 81 MG tablet [id number] No Take by mouth daily. [provider] Taking Active   becaplermin (REGRANEX) 0.01 % gel [id number] No Apply 1 application topically daily. Apply daily to wound bed, cover with sterile gauze McDonald, Stephan Minister, DPM Taking Active   empagliflozin (JARDIANCE) 10 MG TABS tablet [id number]  Take 1 tablet (10 mg total) by mouth daily before breakfast. Marin Olp, MD  Active   gabapentin (NEURONTIN) 300 MG capsule [id number] No Take 2 capsules three times a day. Max for his renal function. Marin Olp, MD Taking Active   lisinopril (ZESTRIL) 20 MG tablet [id number] No Take 1 tablet (20 mg total) by mouth daily. Marin Olp, MD Taking Active   Lysine HCl 1000 MG TABS [id number] No Take 1,000 mg by mouth daily. [provider] Taking Active Self  Magnesium 400 MG TABS [id number] No Take 250 mg by mouth daily. [provider] Taking Active Self  metFORMIN (GLUCOPHAGE) 1000 MG tablet [id number]  TAKE 1 TABLET BY MOUTH TWICE DAILY WITH MEALS Marin Olp, MD  Active   rosuvastatin (CRESTOR) 10 MG tablet [id number]  Take 1 tablet (10 mg total) by  mouth daily. Marin Olp, MD  Active   valACYclovir (VALTREX) 1000 MG tablet [id number] No TAKE TWO TABLETS BY MOUTH TWICE DAILY FOR 1 DAY AS NEEDED Marin Olp, MD Taking Active   zolpidem (AMBIEN) 5 MG tablet [id number] No TAKE 1 TABLET BY MOUTH AT BEDTIME AS NEEDED FOR SLEEP Marin Olp, MD Taking Active             Patient Active Problem List   Diagnosis Date Noted   Aortic atherosclerosis ([place]) 06/25/2020   Insomnia 11/08/2018   CAD (coronary artery disease) 11/08/2018   Right groin hernia 10/24/2016   Basal cell carcinoma of skin 10/28/2015   Fever blister 08/10/2015   Diabetes  mellitus type II, controlled ([place]) 09/23/2013   Idiopathic peripheral neuropathy 01/27/2009   Essential hypertension 03/26/2008   Hyperlipidemia 06/21/2007   History of colonic polyps 06/21/2007    Immunization History  Administered Date(s) Administered   Fluad Quad(high Dose 65+) 10/16/2019, 12/02/2020   Influenza, High Dose Seasonal PF 10/24/2016, 02/12/2018, 11/08/2018   Influenza,inj,Quad PF,6+ Mos 09/23/2013, 11/27/2014, 10/28/2015   PFIZER(Purple Top)SARS-COV-2 Vaccination 01/30/2020, 02/24/2020, 10/02/2020   Pneumococcal Conjugate-13 04/29/2015   Pneumococcal Polysaccharide-23 09/20/2012   Td 05/26/2008   Tdap 05/26/2008, 01/17/2020   Zoster, Live 09/20/2012   Conditions to be addressed/monitored: HTN DMII HLD CAD Insomnia Idopathic Neuropathy   Care Plan : [place]  Updates made by Edythe Clarity, RPH since 08/23/2021 12:00 AM     Problem: HTN DMII HLD CAD Insomnia Idopathic Neuropathy   Priority: High     Long-Range Goal: Disease Management   Start Date: 05/24/2021  Expected End Date: 05/24/2022  Recent Progress: On track  Priority: High  Note:   Current Barriers:  Unable to maintain control of DM II  Pharmacist Clinical Goal(s):  Patient will contact provider office for questions/concerns as evidenced notation of same in electronic health  record through collaboration with PharmD and provider.   Interventions: 1:1 collaboration with Marin Olp, MD regarding development and update of comprehensive plan of care as evidenced by provider attestation and co-signature Inter-disciplinary care team collaboration (see longitudinal plan of care) Comprehensive medication review performed; medication list updated in electronic medical record No Rx changes   Hypertension (BP goal <130/80) -Controlled -Current treatment: Lisinopril 20 mg once daily -Current home readings: none provided -Denies hypotensive/hypertensive symptoms -Educated on Daily salt intake goal < 2300 mg; Symptoms of hypotension and importance of maintaining adequate hydration; -Counseled on diet and exercise extensively Recommended to continue current medication  Hyperlipidemia: (LDL goal < 70) -Not ideally controlled -CAD, DM, HTN, HLD -Current treatment: Rosuvastatin 10 mg once daily (03/2021 start) -Medications previously tried: lovastatin  -Educated on Cholesterol goals;  Benefits of statin for ASCVD risk reduction; -Recommended to continue current medication  Update 08/23/21 Tolerating Crestor fine. Has not had updated lipids since starting. Recommend labs to assess efficacy.  No changes for now.  Diabetes (A1c goal <7%) -Not ideally controlled. a1c 7.5% 03/2021 -Current medications: Metformin 1000 mg twice daily with meals  Jardiance 10 mg once daily -Current home glucose readings fasting glucose: not testing based off of preference, not limited by neuropathy -Denies hypoglycemic/hyperglycemic symptoms -Current meal patterns: intermittent fasting. Likes sweets. -Current exercise: walking limited by sore on foot -Educated on A1c and blood sugar goals; -Counseled to check feet daily and get yearly eye exams -Recommended to continue current medication  Updated 08/23/21 No medication changes since last visit, patient is not checking sugars  at home.  Diet remains the same. He has not had A1c rechecked since restarting the Jardiance. He did not qualify for PAP for Jardiance.  Recommend recheck A1c within the next month or two.  Can titrate Jardiance if needed.  Insomnia (Goal: optimize sleep hygiene) -Controlled -Current treatment  Zoldipem 5 mg once daily  -Reviewed side effects - no problems noted -Recommended to continue current medication Further review at 2 month f/u  Patient Goals/Self-Care Activities Patient will:  --Schedule appt for labs and updated A1c.  Follow Up Plan: CMA call in two months to see if he has scheduled A1c.     Patient's preferred pharmacy is:  [facility], [address] [place] [address] Phone: [phone] Fax: [phone]  Uses pill box? Yes  Follow Up:  Patient agrees to Care Plan and Follow-up.   Future Appointments  Date Time Provider [place]  11/22/2021 11:15 AM Criselda Peaches, DPM [facility]  02/02/2022  2:40 PM Marin Olp, MD [facility]  03/03/2022  2:30 PM [facility] [place], PharmD Clinical Pharmacist  [phone]

## 2021-08-23 NOTE — Patient Instructions (Addendum)
Visit Information   Goals Addressed   None    Patient Care Plan: CCM Pharmacy Care Plan     Problem Identified: HTN DMII HLD CAD Insomnia Idopathic Neuropathy   Priority: High     Long-Range Goal: Disease Management   Start Date: 05/24/2021  Expected End Date: 05/24/2022  Recent Progress: On track  Priority: High  Note:   Current Barriers:  Unable to maintain control of DM II  Pharmacist Clinical Goal(s):  Patient will contact provider office for questions/concerns as evidenced notation of same in electronic health record through collaboration with PharmD and provider.   Interventions: 1:1 collaboration with Marin Olp, MD regarding development and update of comprehensive plan of care as evidenced by provider attestation and co-signature Inter-disciplinary care team collaboration (see longitudinal plan of care) Comprehensive medication review performed; medication list updated in electronic medical record No Rx changes   Hypertension (BP goal <130/80) -Controlled -Current treatment: Lisinopril 20 mg once daily -Current home readings: none provided -Denies hypotensive/hypertensive symptoms -Educated on Daily salt intake goal < 2300 mg; Symptoms of hypotension and importance of maintaining adequate hydration; -Counseled on diet and exercise extensively Recommended to continue current medication  Hyperlipidemia: (LDL goal < 70) -Not ideally controlled -CAD, DM, HTN, HLD -Current treatment: Rosuvastatin 10 mg once daily (03/2021 start) -Medications previously tried: lovastatin  -Educated on Cholesterol goals;  Benefits of statin for ASCVD risk reduction; -Recommended to continue current medication  Update 08/23/21 Tolerating Crestor fine. Has not had updated lipids since starting. Recommend labs to assess efficacy.  No changes for now.  Diabetes (A1c goal <7%) -Not ideally controlled. a1c 7.5% 03/2021 -Current medications: Metformin 1000 mg twice daily  with meals  Jardiance 10 mg once daily -Current home glucose readings fasting glucose: not testing based off of preference, not limited by neuropathy -Denies hypoglycemic/hyperglycemic symptoms -Current meal patterns: intermittent fasting. Likes sweets. -Current exercise: walking limited by sore on foot -Educated on A1c and blood sugar goals; -Counseled to check feet daily and get yearly eye exams -Recommended to continue current medication  Updated 08/23/21 No medication changes since last visit, patient is not checking sugars at home.  Diet remains the same. He has not had A1c rechecked since restarting the Jardiance. He did not qualify for PAP for Jardiance.  Recommend recheck A1c within the next month or two.  Can titrate Jardiance if needed.  Insomnia (Goal: optimize sleep hygiene) -Controlled -Current treatment  Zoldipem 5 mg once daily  -Reviewed side effects - no problems noted -Recommended to continue current medication Further review at 2 month f/u  Patient Goals/Self-Care Activities Patient will:  --Schedule appt for labs and updated A1c.  Follow Up Plan: CMA call in two months to see if he has scheduled A1c.      Patient verbalizes understanding of instructions provided today and agrees to view in [place].  The pharmacy team will reach out to the patient again over the next 180 days.   Edythe Clarity, [place]

## 2021-08-25 ENCOUNTER — Encounter: Payer: Medicare Other | Admitting: Family Medicine

## 2021-09-18 ENCOUNTER — Other Ambulatory Visit: Payer: Self-pay | Admitting: Family Medicine

## 2021-09-30 ENCOUNTER — Telehealth: Payer: Self-pay | Admitting: Pharmacist

## 2021-09-30 NOTE — Chronic Care Management (AMB) (Signed)
Chronic Care Management Pharmacy Assistant   Name: ARIANNA DELSANTO  MRN: [id number] DOB: [birth year]   Reason for Encounter: Diabetes Adherence Call    Recent office visits:  None  Recent consult visits:  None  Hospital visits:  None in previous 6 months  Medications: Outpatient Encounter Medications as of 09/30/2021  Medication Sig   zolpidem (AMBIEN) 5 MG tablet TAKE 1 TABLET BY MOUTH AT BEDTIME AS NEEDED FOR SLEEP   aspirin EC 81 MG tablet Take by mouth daily.   becaplermin (REGRANEX) 0.01 % gel Apply 1 application topically daily. Apply daily to wound bed, cover with sterile gauze   benzonatate (TESSALON PERLES) 100 MG capsule Take 1 capsule (100 mg total) by mouth 3 (three) times daily as needed.   empagliflozin (JARDIANCE) 10 MG TABS tablet Take 1 tablet (10 mg total) by mouth daily before breakfast.   gabapentin (NEURONTIN) 300 MG capsule Take 2 capsules three times a day. Max for his renal function.   lisinopril (ZESTRIL) 20 MG tablet Take 1 tablet by mouth once daily   Lysine HCl 1000 MG TABS Take 1,000 mg by mouth daily.   Magnesium 400 MG TABS Take 250 mg by mouth daily.   metFORMIN (GLUCOPHAGE) 1000 MG tablet TAKE 1 TABLET BY MOUTH TWICE DAILY WITH MEALS   rosuvastatin (CRESTOR) 10 MG tablet Take 1 tablet (10 mg total) by mouth daily.   valACYclovir (VALTREX) 1000 MG tablet TAKE TWO TABLETS BY MOUTH TWICE DAILY FOR 1 DAY AS NEEDED   No facility-administered encounter medications on file as of 09/30/2021.    Recent Relevant Labs: Lab Results  Component Value Date/Time   HGBA1C 7.5 (H) 03/31/2021 01:29 PM   HGBA1C 6.9 (H) 12/02/2020 01:44 PM    Kidney Function Lab Results  Component Value Date/Time   CREATININE 0.97 03/31/2021 01:29 PM   CREATININE 0.97 12/02/2020 01:44 PM   CREATININE 1.12 07/20/2020 12:06 PM   GFR 76.90 03/31/2021 01:29 PM   GFRNONAA 77 12/02/2020 01:44 PM   GFRAA 89 12/02/2020 01:44 PM    Current antihyperglycemic regimen:   Jardiance 10 mg daily Metformin 100 mg twice daily  What recent interventions/DTPs have been made to improve glycemic control:  No recent interventions or DTPs.  Have there been any recent hospitalizations or ED visits since last visit with CPP? No  Patient denies hypoglycemic symptoms.  Patient denies hyperglycemic symptoms.  How often are you checking your blood sugar? Patient states he is not currently monitoring his blood sugars at home.  What are your blood sugars ranging?  Fasting: n/a Before meals: n/a After meals: n/a Bedtime: n/a  During the week, how often does your blood glucose drop below 70? Never  Are you checking your feet daily/regularly? Patient states he does check his feet regularly.  Adherence Review: Is the patient currently on a STATIN medication? Yes Is the patient currently on ACE/ARB medication? Yes Does the patient have >5 day gap between last estimated fill dates? No   Care Gaps: Medicare Annual Wellness: Completed Ophthalmology Exam: Overdue since 03/30/2021 Foot Exam: Overdue since 03/02/2021 Hemoglobin A1C: Due since 09/30/2021 - 7.5% on 03/31/2021 Colonoscopy: Next due on 08/30/2026  Future Appointments  Date Time Provider [place]  11/22/2021 11:15 AM Criselda Peaches, DPM [facility]  02/02/2022  2:40 PM Marin Olp, MD [facility]  02/27/2022  2:15 PM [facility] CCM PHARMACIST [facility]  03/03/2022  2:30 PM [facility] HEALTH COACH [facility]    Star Rating  Drugs: Lisinopril 20 mg last filled 07/04/2021 90 DS Lovastatin 40 mg last filled 02/07/2021 90 DS Rosuvastatin 10 mg last filled 06/22/2021 90 DS Metformin 1000 mg last filled 07/04/2021 90 DS Jardiance 10 mg last filled 07/06/2021 90 DS  April D Calhoun, [place] Pharmacist Assistant [phone]

## 2021-10-04 ENCOUNTER — Other Ambulatory Visit: Payer: Self-pay | Admitting: Family Medicine

## 2021-10-19 ENCOUNTER — Other Ambulatory Visit: Payer: Self-pay | Admitting: Family Medicine

## 2021-11-22 ENCOUNTER — Ambulatory Visit: Payer: Medicare Other | Admitting: Podiatry

## 2021-11-22 ENCOUNTER — Other Ambulatory Visit: Payer: Self-pay

## 2021-11-22 DIAGNOSIS — E1142 Type 2 diabetes mellitus with diabetic polyneuropathy: Secondary | ICD-10-CM

## 2021-11-22 DIAGNOSIS — L84 Corns and callosities: Secondary | ICD-10-CM | POA: Diagnosis not present

## 2021-11-22 DIAGNOSIS — B351 Tinea unguium: Secondary | ICD-10-CM | POA: Diagnosis not present

## 2021-11-22 DIAGNOSIS — M79674 Pain in right toe(s): Secondary | ICD-10-CM

## 2021-11-22 DIAGNOSIS — M79675 Pain in left toe(s): Secondary | ICD-10-CM

## 2021-11-22 NOTE — Progress Notes (Signed)
  Subjective:  Patient ID: William Brewer, male    DOB: Sep 24, 1946,  MRN: [id number]  Chief Complaint  Patient presents with   Diabetes    at risk diabetic foot care.    75 y.o. male returns with the above complaint. History confirmed with patient.  Doing well the ulcer remains healed, has no new issues his blood sugar has been well controlled  Objective:  Physical Exam: warm, good capillary refill, normal DP and PT pulses and absent light touch and protective sensation bilateral plantar foot.  Onychomycosis x10 with significant brown discoloration of the left hallux and second toe.  Right hallux callus present      Assessment:   1. Pain due to onychomycosis of toenails of both feet   2. Callus of foot   3. Type 2 diabetes mellitus with polyneuropathy ([place])      Plan:  Patient was evaluated and treated and all questions answered.  Ulcer right hallux All symptomatic hyperkeratoses were safely debrided with a sterile #15 blade to patient's level of comfort without incident. We discussed preventative and palliative care of these lesions including supportive and accommodative shoegear, padding, prefabricated and custom molded accommodative orthoses, use of a pumice stone and lotions/creams daily.  Patient educated on diabetes. Discussed proper diabetic foot care and discussed risks and complications of disease. Educated patient in depth on reasons to return to the office immediately should he/she discover anything concerning or new on the feet. All questions answered. Discussed proper shoes as well.   Discussed the etiology and treatment options for the condition in detail with the patient. Educated patient on the topical and oral treatment options for mycotic nails. Recommended debridement of the nails today. Sharp and mechanical debridement performed of all painful and mycotic nails today. Nails debrided in length and thickness using a nail nipper to level of comfort. Discussed  treatment options including appropriate shoe gear. Follow up as needed for painful nails.    .   Return in about 3 months (around 02/21/2022) for at risk diabetic foot care.

## 2021-11-28 ENCOUNTER — Other Ambulatory Visit: Payer: Self-pay | Admitting: Family Medicine

## 2022-01-01 ENCOUNTER — Other Ambulatory Visit: Payer: Self-pay | Admitting: Family Medicine

## 2022-02-01 NOTE — Progress Notes (Signed)
Phone: [phone]   Subjective:  Patient presents today for their annual physical. Chief complaint-noted.   See problem oriented charting- ROS- full  review of systems was completed and negative  except for: chronic pain and numbness/tingling in feet  The following were reviewed and entered/updated in epic: Past Medical History:  Diagnosis Date   Allergy    Basal cell carcinoma    leg   Colon polyps    Diabetes mellitus without complication ([place])    DIVERTICULOSIS, COLON 05/26/2008   Hyperlipidemia    refused treatment   Hypertension    Peripheral neuropathy    Patient Active Problem List   Diagnosis Date Noted   CAD (coronary artery disease) 11/08/2018    Priority: High   Diabetes mellitus type II, controlled ([place]) 09/23/2013    Priority: High   Idiopathic peripheral neuropathy 01/27/2009    Priority: High   Aortic atherosclerosis ([place]) 06/25/2020    Priority: Medium    Insomnia 11/08/2018    Priority: Medium    Right groin hernia 10/24/2016    Priority: Medium    Essential hypertension 03/26/2008    Priority: Medium    Hyperlipidemia 06/21/2007    Priority: Medium    Basal cell carcinoma of skin 10/28/2015    Priority: Low   Fever blister 08/10/2015    Priority: Low   History of colonic polyps 06/21/2007    Priority: Low   Past Surgical History:  Procedure Laterality Date   APPENDECTOMY     COLONOSCOPY     LEFT HEART CATH AND CORONARY ANGIOGRAPHY N/A 03/26/2018   Procedure: LEFT HEART CATH AND CORONARY ANGIOGRAPHY;  Surgeon: Jettie Booze, MD;  Location: [facility];  Service: Cardiovascular;  Laterality: N/A;   POLYPECTOMY     ULTRASOUND GUIDANCE FOR VASCULAR ACCESS  03/26/2018   Procedure: Ultrasound Guidance For Vascular Access;  Surgeon: Jettie Booze, MD;  Location: [facility];  Service: Cardiovascular;;    Family History  Adopted: Yes  Problem Relation Age of Onset   Deep vein thrombosis Mother        cancer   Cancer  Mother        intestine   Heart disease Father 63       CABG, former smoker but not heavy   Breast cancer Daughter    Alzheimer's disease Maternal Grandmother    Aortic aneurysm Maternal Grandfather    Hypertension Neg Hx    Colon cancer Neg Hx     Medications- reviewed and updated Current Outpatient Medications  Medication Sig Dispense Refill   aspirin EC 81 MG tablet Take by mouth daily.     becaplermin (REGRANEX) 0.01 % gel Apply 1 application topically daily. Apply daily to wound bed, cover with sterile gauze 15 g 0   benzonatate (TESSALON PERLES) 100 MG capsule Take 1 capsule (100 mg total) by mouth 3 (three) times daily as needed. 20 capsule 0   empagliflozin (JARDIANCE) 10 MG TABS tablet Take 1 tablet (10 mg total) by mouth daily before breakfast. 90 tablet 3   gabapentin (NEURONTIN) 300 MG capsule TAKE 2 CAPSULES BY MOUTH THREE TIMES DAILY. MAX FOR HIS RENAL FUNCTION 540 capsule 3   lisinopril (ZESTRIL) 20 MG tablet Take 1 tablet by mouth once daily 90 tablet 0   Lysine HCl 1000 MG TABS Take 1,000 mg by mouth daily.     Magnesium 400 MG TABS Take 250 mg by mouth daily.     metFORMIN (GLUCOPHAGE) 1000  MG tablet TAKE 1 TABLET BY MOUTH TWICE DAILY WITH MEALS 180 tablet 0   rosuvastatin (CRESTOR) 10 MG tablet Take 1 tablet (10 mg total) by mouth daily. 90 tablet 3   valACYclovir (VALTREX) 1000 MG tablet TAKE TWO TABLETS BY MOUTH TWICE DAILY FOR 1 DAY AS NEEDED 20 tablet 3   zolpidem (AMBIEN) 5 MG tablet TAKE 1 TABLET BY MOUTH AT BEDTIME AS NEEDED FOR SLEEP 30 tablet 5   No current facility-administered medications for this visit.    Allergies-reviewed and updated No Active Allergies  Social History   Social History Narrative   Married 1973. 2 children. 2 grandkids. Son in [place]. Daughter 5 minutes away.       Retired Worked form Administrator, arts: support group for peripheral neuropathy, chess, words with friends, active with  [employer] college (board of advisors)   Objective  Objective:  BP 106/72    Pulse 72    Temp 98.3 F (36.8 C)    Ht 6' (1.829 m)    Wt 184 lb (83.5 kg)    SpO2 97%    BMI 24.95 kg/m  Gen: NAD, resting comfortably HEENT: Mucous membranes are moist. Oropharynx normal Neck: no thyromegaly CV: RRR no murmurs rubs or gallops Lungs: CTAB no crackles, wheeze, rhonchi Abdomen: soft/nontender/nondistended/normal bowel sounds. No rebound or guarding.  Ext: no edema Skin: warm, dry Neuro: grossly normal, moves all extremities, PERRLA, mild balance issues with neuropathy  Diabetic Foot Exam - Simple   Simple Foot Form Diabetic Foot exam was performed with the following findings: Yes 02/02/2022  3:33 PM  Visual Inspection See comments: Yes Sensation Testing See comments: Yes Pulse Check Posterior Tibialis and Dorsalis pulse intact bilaterally: Yes Comments Complete loss of sensation to monofilament. Slight ulceration on great toe- sees podiatry soon        Assessment and Plan  76 y.o. male presenting for annual physical.  Health Maintenance counseling: 1. Anticipatory guidance: Patient counseled regarding regular dental exams -q6 months- knows needs to set up, eye exams -yearly,  avoiding smoking and second hand smoke, limiting alcohol to 2 beverages per day - 1 beer a week. No illicit drugs    2. Risk factor reduction:  Advised patient of need for regular exercise and diet rich and fruits and vegetables to reduce risk of heart attack and stroke.  Exercise- not exercising recently- encouraged to start- set back with the toe. Yoga twice a week- encouraged to consider chair weighted exercises.  Diet/weight management-weight down 3 pounds from last visit.   got as low as 176 in past but after loss of son.  Wt Readings from Last 3 Encounters:  02/02/22 184 lb (83.5 kg)  03/31/21 187 lb 6.4 oz (85 kg)  12/02/20 183 lb (83 kg)  3. Immunizations/screenings/ancillary studies DISCUSSED:   -Shingrix vaccination #1- recommended at [facility] exam- will get records -Of note also had COVID last July 2022 Immunization History  Administered Date(s) Administered   Fluad Quad(high Dose 65+) 10/16/2019, 12/02/2020   Influenza, High Dose Seasonal PF 10/24/2016, 02/12/2018, 11/08/2018, 10/08/2021   Influenza,inj,Quad PF,6+ Mos 09/23/2013, 11/27/2014, 10/28/2015   PFIZER(Purple Top)SARS-COV-2 Vaccination 01/30/2020, 02/24/2020, 10/02/2020, 05/31/2021   Pfizer Covid-19 Vaccine Bivalent Booster 18yrs & up 10/31/2021   Pneumococcal Conjugate-13 04/29/2015   Pneumococcal Polysaccharide-23 09/20/2012   Td 05/26/2008   Tdap 05/26/2008, 01/17/2020   Zoster, Live 09/20/2012  4. Prostate cancer screening-  opts out of PSA screening due to potential risks  Lab Results  Component Value Date   PSA 1.79 09/13/2012   PSA 1.38 07/18/2011   PSA 1.79 08/02/2010   5. Colon cancer screening - 08/30/2016 with 10 year repeat planned- stopping- will decide based on guidelines at that time 6. Skin cancer screening-  follows with dermatology.advised regular sunscreen use. Denies worrisome, changing, or new skin lesions.  7. Smoking associated screening (lung cancer screening, AAA screen  - 65-75, UA)- Never smoker-  8. STD screening - monogomous  Status of chronic or acute concerns   #CAD-nonobstructive based off CAD in 2019 #hyperlipidemia #Aortic atherosclerosis S: Medication: Rosuvastatin 10 mg and aspirin 81 mg daily Lab Results  Component Value Date   CHOL 144 03/31/2021   HDL 40.30 03/31/2021   LDLCALC 59 03/02/2020   LDLDIRECT 88.0 03/31/2021   TRIG 220.0 (H) 03/31/2021   CHOLHDL 4 03/31/2021   A/P: Ideal LDL will be 70 or less-update lipid level today-we discussed possibly tweaking medicine if LDL still above 70.  Aortic atherosclerosis and CAD-LDL goal under 70-no current symptoms from either.  For hyperlipidemia continue rosuvastatin 10 mg  # Diabetes S: Medication: Metformin  1000Mg  twice daily, Jardiance 10Mg  daily before breakfast- very costly though 400 for 3 months CBGs- squeamish not checking much Lab Results  Component Value Date   HGBA1C 7.5 (H) 03/31/2021   HGBA1C 6.9 (H) 12/02/2020   HGBA1C 7.6 (H) 07/20/2020   A/P: Hopefully improved-update A1c with labs today-likely tolerate up to 7.5 A1c without medication change From avs "If you could find a list of tiers for diabetes medications that would be helpful" -I would recommend when you have those weak spells intermittently to check blood sugar to make sure not too high (above 180) or too low (below 70)   #hypertension S: medication: Lisinopril 20Mg  daily. -We previously stopped hydrochlorothiazide 12.5 mg due to orthostatic symptoms-was still having some mild symptoms last visit and we considered reducing to 10 mg -no orthostatic symptoms BP Readings from Last 3 Encounters:  02/02/22 106/72  03/31/21 134/80  12/02/20 113/67  A/P: reasonable control- actually considered reducing dose and if persistently this low will plan on that or if reduced kidney function  #Idiopathic neuropathy predating diabetes S: Medication: Gabapentin 600 mg 3 times a day-max dose for his renal function . He is doing 900 twice a day and then 1 before bed.  -We also submitted for hand controlled steering due to worsened neuropathy in his feet- due to hands bothering him opted out of hand controlled as well but he later opted out and decided not to drive  Today patient reported, continued worsening of neuropathy - wants to talk about options   -discussed with his crcl being close to 80 probably could tolerate 2100mg  but want to avoid - drops him just below tolerable threshold A/P: poor control but tolerable. When gets close to running out he will reach out- prefers to do 600mg  pills so would be 1.5 twice daily then 0.5 before bed.   -filled out handicap placard for him today    #Insomnia S: Medication: Ambien 5 mg as needed -  rarely takes a break belsomra was too costly, trazodone not effective A/P: reasonable control- continue current meds.    Recommended follow up: Return in about 4 months (around 06/02/2022) for follow up- or sooner if needed. Future Appointments  Date Time Provider [place]  02/20/2022 11:15 AM Criselda Peaches, DPM [facility]  02/27/2022  2:15 PM [facility] CCM PHARMACIST [facility]  03/03/2022  2:30 PM [facility] HEALTH COACH [facility]   Lab/Order associations: Not fasting   ICD-10-CM   1. Preventative health care  Z00.00     2. Essential hypertension  I10     3. Hyperlipidemia, unspecified hyperlipidemia type  E78.5     4. Aortic atherosclerosis (HCC)  I70.0     5. Coronary artery disease involving native coronary artery of native heart without angina pectoris  I25.10     6. Controlled type 2 diabetes mellitus with diabetic polyneuropathy, without long-term current use of insulin (HCC)  E11.42     7. Idiopathic peripheral neuropathy  G60.9     8. Primary insomnia  F51.01      No orders of the defined types were placed in this encounter.   I,Jada Bradford,acting as a scribe for Garret Reddish, MD.,have documented all relevant documentation on the behalf of Garret Reddish, MD,as directed by  Garret Reddish, MD while in the presence of Garret Reddish, MD.   I, Garret Reddish, MD, have reviewed all documentation for this visit. The documentation on 02/02/22 for the exam, diagnosis, procedures, and orders are all accurate and complete.   Return precautions advised.  Garret Reddish, MD

## 2022-02-02 ENCOUNTER — Encounter: Payer: Self-pay | Admitting: Family Medicine

## 2022-02-02 ENCOUNTER — Ambulatory Visit (INDEPENDENT_AMBULATORY_CARE_PROVIDER_SITE_OTHER): Payer: Medicare Other | Admitting: Family Medicine

## 2022-02-02 ENCOUNTER — Other Ambulatory Visit: Payer: Self-pay

## 2022-02-02 VITALS — BP 106/72 | HR 72 | Temp 98.3°F | Ht 72.0 in | Wt 184.0 lb

## 2022-02-02 DIAGNOSIS — F5101 Primary insomnia: Secondary | ICD-10-CM | POA: Diagnosis not present

## 2022-02-02 DIAGNOSIS — E1142 Type 2 diabetes mellitus with diabetic polyneuropathy: Secondary | ICD-10-CM

## 2022-02-02 DIAGNOSIS — G609 Hereditary and idiopathic neuropathy, unspecified: Secondary | ICD-10-CM

## 2022-02-02 DIAGNOSIS — I2510 Atherosclerotic heart disease of native coronary artery without angina pectoris: Secondary | ICD-10-CM

## 2022-02-02 DIAGNOSIS — I700 Atherosclerosis of aorta: Secondary | ICD-10-CM | POA: Diagnosis not present

## 2022-02-02 DIAGNOSIS — E785 Hyperlipidemia, unspecified: Secondary | ICD-10-CM

## 2022-02-02 DIAGNOSIS — Z79899 Other long term (current) drug therapy: Secondary | ICD-10-CM

## 2022-02-02 DIAGNOSIS — Z0000 Encounter for general adult medical examination without abnormal findings: Secondary | ICD-10-CM | POA: Diagnosis not present

## 2022-02-02 DIAGNOSIS — I10 Essential (primary) hypertension: Secondary | ICD-10-CM

## 2022-02-02 NOTE — Patient Instructions (Addendum)
Sign release of information at the check out desk for last diabetic eye exam  Please check with your pharmacy to see if they have the shingrix vaccine. If they do- please get this immunization and update [place] by phone call or mychart with dates you receive the vaccine  If you could find a list of tiers for diabetes medications that would be helpful  -I would recommend when you have those weak spells intermittently to check blood sugar to make sure not too high (above 180) or too low (below 70)   Please stop by lab before you go If you have mychart- we will send your results within 3 business days of [place] receiving them.  If you do not have mychart- we will call you about results within 5 business days of [place] receiving them.  *please also note that you will see labs on mychart as soon as they post. I will later go in and write notes on them- will say "notes from Dr. Yong Channel"   Recommended follow up: Return in about 6 months (around 08/02/2022) for follow up- or sooner if needed.

## 2022-02-03 LAB — COMPREHENSIVE METABOLIC PANEL
ALT: 37 U/L (ref 0–53)
AST: 29 U/L (ref 0–37)
Albumin: 4.6 g/dL (ref 3.5–5.2)
Alkaline Phosphatase: 49 U/L (ref 39–117)
BUN: 34 mg/dL — ABNORMAL HIGH (ref 6–23)
CO2: 25 mEq/L (ref 19–32)
Calcium: 10.3 mg/dL (ref 8.4–10.5)
Chloride: 102 mEq/L (ref 96–112)
Creatinine, Ser: 1.1 mg/dL (ref 0.40–1.50)
GFR: 65.73 mL/min (ref >60.00)
Glucose, Bld: 121 mg/dL — ABNORMAL HIGH (ref 70–99)
Potassium: 4.3 mEq/L (ref 3.5–5.1)
Sodium: 140 mEq/L (ref 135–145)
Total Bilirubin: 0.6 mg/dL (ref 0.2–1.2)
Total Protein: 7.6 g/dL (ref 6.0–8.3)

## 2022-02-03 LAB — LDL CHOLESTEROL, DIRECT: Direct LDL: 72 mg/dL

## 2022-02-03 LAB — CBC WITH DIFFERENTIAL/PLATELET
Basophils Absolute: 0 10*3/uL (ref 0.0–0.1)
Basophils Relative: 0.5 % (ref 0.0–3.0)
Eosinophils Absolute: 0.1 10*3/uL (ref 0.0–0.7)
Eosinophils Relative: 1.2 % (ref 0.0–5.0)
HCT: 46 % (ref 39.0–52.0)
Hemoglobin: 15.2 g/dL (ref 13.0–17.0)
Lymphocytes Relative: 23.7 % (ref 12.0–46.0)
Lymphs Abs: 1.7 10*3/uL (ref 0.7–4.0)
MCHC: 32.9 g/dL (ref 30.0–36.0)
MCV: 93.2 fl (ref 78.0–100.0)
Monocytes Absolute: 0.6 10*3/uL (ref 0.1–1.0)
Monocytes Relative: 8.9 % (ref 3.0–12.0)
Neutro Abs: 4.6 10*3/uL (ref 1.4–7.7)
Neutrophils Relative %: 65.7 % (ref 43.0–77.0)
Platelets: 202 10*3/uL (ref 150.0–400.0)
RBC: 4.94 Mil/uL (ref 4.22–5.81)
RDW: 13.6 % (ref 11.5–15.5)
WBC: 7.1 10*3/uL (ref 4.0–10.5)

## 2022-02-03 LAB — VITAMIN B12: Vitamin B-12: 239 pg/mL (ref 211–911)

## 2022-02-03 LAB — HEMOGLOBIN A1C: Hgb A1c MFr Bld: 8.1 % — ABNORMAL HIGH (ref 4.6–6.5)

## 2022-02-20 ENCOUNTER — Ambulatory Visit: Payer: Medicare Other | Admitting: Podiatry

## 2022-02-20 ENCOUNTER — Other Ambulatory Visit: Payer: Self-pay

## 2022-02-20 DIAGNOSIS — B351 Tinea unguium: Secondary | ICD-10-CM

## 2022-02-20 DIAGNOSIS — L03031 Cellulitis of right toe: Secondary | ICD-10-CM

## 2022-02-20 DIAGNOSIS — E1142 Type 2 diabetes mellitus with diabetic polyneuropathy: Secondary | ICD-10-CM | POA: Diagnosis not present

## 2022-02-20 DIAGNOSIS — L97511 Non-pressure chronic ulcer of other part of right foot limited to breakdown of skin: Secondary | ICD-10-CM | POA: Diagnosis not present

## 2022-02-21 MED ORDER — CEPHALEXIN 500 MG PO CAPS: 500.0000 mg | ORAL_CAPSULE | Freq: Three times a day (TID) | ORAL | 0 refills | Status: DC

## 2022-02-21 MED ORDER — MUPIROCIN 2 % EX OINT: 1.0000 "application " | TOPICAL_OINTMENT | Freq: Two times a day (BID) | CUTANEOUS | 2 refills | Status: DC

## 2022-02-21 NOTE — Progress Notes (Signed)
°  Subjective:  Patient ID: William Brewer, male    DOB: [birth year],  MRN: [id number]  Chief Complaint  Patient presents with   Diabetes      at risk diabetic foot care    76 y.o. male returns with the above complaint. History confirmed with patient.  Blood sugar remains well controlled.  He noticed the right great toe getting red and swollen over the last few days  Objective:  Physical Exam: warm, good capillary refill, normal DP and PT pulses and absent light touch and protective sensation bilateral plantar foot.  Onychomycosis x10 with significant brown discoloration of the left hallux and second toe.  Right hallux ulceration has returned and is only limited to breakdown of skin no drainage there is no subcutaneous tissue.  Cellulitis of the toe to the IPJ extending from the wound      Assessment:   1. Cellulitis of great toe of right foot   2. Chronic ulcer of great toe of right foot, limited to breakdown of skin ([place])   3. Type 2 diabetes mellitus with polyneuropathy ([place])   4. Onychomycosis      Plan:  Patient was evaluated and treated and all questions answered.  Ulcer right hallux Unfortunately has had minor recurrence of his ulceration.  We discussed offloading and applying mupirocin ointment which I sent a refill for.  Also Rx for Keflex was sent to pharmacy for cellulitis.  I will see him back in 4 weeks for reevaluation.    Patient educated on diabetes. Discussed proper diabetic foot care and discussed risks and complications of disease. Educated patient in depth on reasons to return to the office immediately should he/she discover anything concerning or new on the feet. All questions answered. Discussed proper shoes as well.   Discussed the etiology and treatment options for the condition in detail with the patient. Educated patient on the topical and oral treatment options for mycotic nails. Recommended debridement of the nails today. Sharp and mechanical  debridement performed of all painful and mycotic nails today. Nails debrided in length and thickness using a nail nipper to level of comfort. Discussed treatment options including appropriate shoe gear. Follow up as needed for painful nails.    .   Return in about 4 weeks (around 03/20/2022) for wound check.

## 2022-02-23 NOTE — Progress Notes (Signed)
Chronic Care Management Pharmacy Note  02/27/2022 Name:  William Brewer MRN:  [id number] DOB:  Nov 10, 1946  Summary: Cost concern with Jardiance.  Going to help him apply for [place] and [place] Marcelline Deist) because it is much easier to qualify for.  Once he sends me his medicare number I will assist in this application.  Can replace Jardiance with this if he will get it for free to improve adherence.  Also complains of neuropathy - could consider addition of Cymbalta.  Subjective: William Brewer is an 76 y.o. year old male who is a primary patient of Hunter, Aldine Contes, MD.  The CCM team was consulted for assistance with disease management and care coordination needs.    Engaged with patient by telephone for follow up visit in response to provider referral for pharmacy case management and/or care coordination services.   Consent to Services:  The patient was given the following information about Chronic Care Management services today, agreed to services, and gave verbal consent: 1. CCM service includes personalized support from designated clinical staff supervised by the primary care provider, including individualized plan of care and coordination with other care providers 2. 24/7 contact phone numbers for assistance for urgent and routine care needs. 3. Service will only be billed when office clinical staff spend 20 minutes or more in a month to coordinate care. 4. Only one practitioner may furnish and bill the service in a calendar month. 5.The patient may stop CCM services at any time (effective at the end of the month) by phone call to the office staff. 6. The patient will be responsible for cost sharing (co-pay) of up to 20% of the service fee (after annual deductible is met). Patient agreed to services and consent obtained.   Patient Care Team: Shelva Majestic, MD as PCP - General (Family Medicine) Lars Masson, MD as PCP - Cardiology (Cardiology) Davina Poke as Consulting Physician  (Optometry) Charlsie Merles, Kirstie Peri, DPM as Consulting Physician (Podiatry) Erroll Luna, [facility] (Pharmacist)  Recent office visits:  None   Recent consult visits:  None   Hospital visits:  None in previous 6 months  Objective: Lab Results  Component Value Date   CREATININE 1.10 02/02/2022   CREATININE 0.97 03/31/2021   CREATININE 0.97 12/02/2020   Lab Results  Component Value Date   HGBA1C 8.1 (H) 02/02/2022   Last diabetic Eye exam:  Lab Results  Component Value Date/Time   HMDIABEYEEXA No Retinopathy 03/30/2020 12:00 AM    Last diabetic Foot exam: No results found for: HMDIABFOOTEX      Component Value Date/Time   CHOL 144 03/31/2021 1329   TRIG 220.0 (H) 03/31/2021 1329   HDL 40.30 03/31/2021 1329   CHOLHDL 4 03/31/2021 1329   VLDL 44.0 (H) 03/31/2021 1329   LDLCALC 59 03/02/2020 0920   LDLDIRECT 72.0 02/02/2022 1553    Hepatic Function Latest Ref Rng & Units 02/02/2022 03/31/2021 12/02/2020  Total Protein 6.0 - 8.3 g/dL 7.6 7.1 7.1  Albumin 3.5 - 5.2 g/dL 4.6 4.7 -  AST 0 - 37 U/L 29 35 29  ALT 0 - 53 U/L 37 41 31  Alk Phosphatase 39 - 117 U/L 49 57 -  Total Bilirubin 0.2 - 1.2 mg/dL 0.6 0.7 0.6  Bilirubin, Direct 0.0 - 0.3 mg/dL - - -    Lab Results  Component Value Date/Time   TSH 3.89 02/12/2018 08:45 AM   TSH 2.13 11/30/2014 10:03 AM    CBC Latest  Ref Rng & Units 02/02/2022 03/31/2021 07/20/2020  WBC 4.0 - 10.5 K/uL 7.1 5.7 5.7  Hemoglobin 13.0 - 17.0 g/dL 15.2 15.3 14.8  Hematocrit 39.0 - 52.0 % 46.0 44.6 45.0  Platelets 150.0 - 400.0 K/uL 202.0 172.0 193    No results found for: VD25OH  Clinical ASCVD:  The 10-year ASCVD risk score (Arnett DK, et al., 2019) is: 35.7%   Values used to calculate the score:     Age: 73 years     Sex: Male     Is Non-Hispanic African American: No     Diabetic: Yes     Tobacco smoker: No     Systolic Blood Pressure: 374 mmHg     Is BP treated: Yes     HDL Cholesterol: 40.3 mg/dL     Total Cholesterol: 144 mg/dL     Social History   Tobacco Use  Smoking Status Never  Smokeless Tobacco Never   BP Readings from Last 3 Encounters:  02/02/22 106/72  03/31/21 134/80  12/02/20 113/67   Pulse Readings from Last 3 Encounters:  02/02/22 72  03/31/21 67  12/02/20 87   Wt Readings from Last 3 Encounters:  02/02/22 184 lb (83.5 kg)  03/31/21 187 lb 6.4 oz (85 kg)  12/02/20 183 lb (83 kg)    Assessment: Review of patient past medical history, allergies, medications, health status, including review of consultants reports, laboratory and other test data, was performed as part of comprehensive evaluation and provision of chronic care management services.   SDOH:  (Social Determinants of Health) assessments and interventions performed: Yes.   CCM Care Plan  No Active Allergies  Medications Reviewed Today     Reviewed by Edythe Clarity, [facility] (Pharmacist) on 02/27/22 at 1447  Med List Status: <None>   Medication Order Taking? Sig Documenting Provider Last Dose Status Informant  aspirin EC 81 MG tablet [id number] Yes Take by mouth daily. [provider] Taking Active   becaplermin (REGRANEX) 0.01 % gel [id number] Yes Apply 1 application topically daily. Apply daily to wound bed, cover with sterile gauze McDonald, Stephan Minister, DPM Taking Active   benzonatate (TESSALON PERLES) 100 MG capsule [id number] Yes Take 1 capsule (100 mg total) by mouth 3 (three) times daily as needed. Lucretia Kern, DO Taking Active   cephALEXin (KEFLEX) 500 MG capsule [id number] Yes Take 1 capsule (500 mg total) by mouth 3 (three) times daily. Criselda Peaches, DPM Taking Active   empagliflozin (JARDIANCE) 10 MG TABS tablet [id number] Yes Take 1 tablet (10 mg total) by mouth daily before breakfast. Marin Olp, MD Taking Active   gabapentin (NEURONTIN) 300 MG capsule [id number] Yes TAKE 2 CAPSULES BY MOUTH THREE TIMES DAILY. MAX FOR HIS RENAL FUNCTION Marin Olp, MD Taking Active   lisinopril (ZESTRIL) 20 MG  tablet [id number] Yes Take 1 tablet by mouth once daily Marin Olp, MD Taking Active   Lysine HCl 1000 MG TABS [id number] Yes Take 1,000 mg by mouth daily. [provider] Taking Active Self  Magnesium 400 MG TABS [id number] Yes Take 250 mg by mouth daily. [provider] Taking Active Self  metFORMIN (GLUCOPHAGE) 1000 MG tablet [id number] Yes TAKE 1 TABLET BY MOUTH TWICE DAILY WITH MEALS Marin Olp, MD Taking Active   mupirocin ointment (BACTROBAN) 2 % [id number] Yes Apply 1 application topically 2 (two) times daily. Criselda Peaches, DPM Taking Active   rosuvastatin (CRESTOR) 10 MG tablet [id number] Yes Take 1 tablet (10 mg  total) by mouth daily. Marin Olp, MD Taking Active   valACYclovir (VALTREX) 1000 MG tablet [id number] Yes TAKE TWO TABLETS BY MOUTH TWICE DAILY FOR 1 DAY AS NEEDED Marin Olp, MD Taking Active   zolpidem (AMBIEN) 5 MG tablet [id number] Yes TAKE 1 TABLET BY MOUTH AT BEDTIME AS NEEDED FOR SLEEP Marin Olp, MD Taking Active             Patient Active Problem List   Diagnosis Date Noted   Aortic atherosclerosis ([place]) 06/25/2020   Insomnia 11/08/2018   CAD (coronary artery disease) 11/08/2018   Right groin hernia 10/24/2016   Basal cell carcinoma of skin 10/28/2015   Fever blister 08/10/2015   Diabetes mellitus type II, controlled ([place]) 09/23/2013   Idiopathic peripheral neuropathy 01/27/2009   Essential hypertension 03/26/2008   Hyperlipidemia 06/21/2007   History of colonic polyps 06/21/2007    Immunization History  Administered Date(s) Administered   Fluad Quad(high Dose 65+) 10/16/2019, 12/02/2020   Influenza, High Dose Seasonal PF 10/24/2016, 02/12/2018, 11/08/2018, 10/08/2021   Influenza,inj,Quad PF,6+ Mos 09/23/2013, 11/27/2014, 10/28/2015   PFIZER(Purple Top)SARS-COV-2 Vaccination 01/30/2020, 02/24/2020, 10/02/2020, 05/31/2021   Pfizer Covid-19 Vaccine Bivalent Booster 27yrs & up 10/31/2021   Pneumococcal  Conjugate-13 04/29/2015   Pneumococcal Polysaccharide-23 09/20/2012   Td 05/26/2008   Tdap 05/26/2008, 01/17/2020   Zoster, Live 09/20/2012   Conditions to be addressed/monitored: HTN DMII HLD CAD Insomnia Idopathic Neuropathy   Care Plan : [place]  Updates made by Edythe Clarity, RPH since 02/27/2022 12:00 AM     Problem: HTN DMII HLD CAD Insomnia Idopathic Neuropathy   Priority: High     Long-Range Goal: Disease Management   Start Date: 05/24/2021  Expected End Date: 05/24/2022  Recent Progress: On track  Priority: High  Note:   Current Barriers:  Unable to maintain control of DM II. neuropathy  Pharmacist Clinical Goal(s):  Patient will contact provider office for questions/concerns as evidenced notation of same in electronic health record through collaboration with PharmD and provider.   Interventions: 1:1 collaboration with Marin Olp, MD regarding development and update of comprehensive plan of care as evidenced by provider attestation and co-signature Inter-disciplinary care team collaboration (see longitudinal plan of care) Comprehensive medication review performed; medication list updated in electronic medical record No Rx changes   Hypertension (BP goal <130/80) -Controlled -Current treatment: Lisinopril 20 mg once daily -Current home readings: none provided -Denies hypotensive/hypertensive symptoms -Educated on Daily salt intake goal < 2300 mg; Symptoms of hypotension and importance of maintaining adequate hydration; -Counseled on diet and exercise extensively Recommended to continue current medication  Hyperlipidemia: (LDL goal < 70) -Not ideally controlled -CAD, DM, HTN, HLD -Current treatment: Rosuvastatin 10 mg once daily (03/2021 start) -Medications previously tried: lovastatin  -Educated on Cholesterol goals;  Benefits of statin for ASCVD risk reduction; -Recommended to continue current medication  Update  08/23/21 Tolerating Crestor fine. Has not had updated lipids since starting. Recommend labs to assess efficacy. No changes for now.  Update 02/27/22 Continues same dose of Crestor, LDL almost at goal < 70 at most recent labs. Work on [employer], continue routine screenings. No changes needed at this time.  Diabetes (A1c goal <7%) -Not ideally controlled. a1c 7.5% 03/2021 -Current medications: Metformin 1000 mg twice daily with meals Appropriate, Effective, Safe, Accessible Jardiance 10 mg once daily Appropriate, Query effective, -Current home glucose readings fasting glucose: not testing based off of preference, not limited by neuropathy -Denies hypoglycemic/hyperglycemic symptoms -Current  meal patterns: intermittent fasting. Likes sweets. -Current exercise: walking limited by sore on foot -Educated on A1c and blood sugar goals; -Counseled to check feet daily and get yearly eye exams -Recommended to continue current medication  Updated 08/23/21 No medication changes since last visit, patient is not checking sugars at home.  Diet remains the same. He has not had A1c rechecked since restarting the Jardiance. He did not qualify for PAP for Jardiance. Recommend recheck A1c within the next month or two.  Can titrate Jardiance if needed.  Update 02/27/22 Patient most recent A1c was 8.1% he had a period where he was not taking his Jardiance due to cost barrier.  He is not checking glucose at home.  He has resumed Jardiance at $RemoveBefo'10mg'VQQPCRkgrCc$ .  We discussed applying for Farxiga patient assistance - his is going to search for his medicare card and provide me with the number so that I can help him apply.  Could replace Jardiance with this if he qualifies. May need to titrate to higher dose of SGLT-2 to get patient closer to goal.  Discussed dietary changes. No changes at this time - apply for Farxiga PAP. Insomnia (Goal: optimize sleep hygiene) -Controlled -Current treatment  Zoldipem 5 mg once  daily  -Reviewed side effects - no problems noted -Recommended to continue current medication Further review at 2 month f/u  Patient Goals/Self-Care Activities Patient will:  --Focus on adherence  Follow Up Plan:Fu 3 months         Patient's preferred pharmacy is:  [facility], [address] [address] Phone: [phone] Fax: [phone]   Uses pill box? Yes  Follow Up:  Patient agrees to Care Plan and Follow-up.   Future Appointments  Date Time Provider [place]  03/03/2022  2:30 PM [facility]  03/20/2022 11:15 AM Criselda Peaches, DPM [facility]  05/23/2022  1:15 PM Criselda Peaches, DPM [facility]  06/02/2022  3:40 PM Yong Channel, Brayton Mars, MD [facility] [place], PharmD Clinical Pharmacist  [facility] [phone]

## 2022-02-27 ENCOUNTER — Ambulatory Visit (INDEPENDENT_AMBULATORY_CARE_PROVIDER_SITE_OTHER): Payer: Medicare Other | Admitting: Pharmacist

## 2022-02-27 DIAGNOSIS — E1142 Type 2 diabetes mellitus with diabetic polyneuropathy: Secondary | ICD-10-CM

## 2022-02-27 DIAGNOSIS — E782 Mixed hyperlipidemia: Secondary | ICD-10-CM

## 2022-02-27 NOTE — Patient Instructions (Addendum)
Visit Information ? ? Goals Addressed   ?None ?  ? ?Patient Care Plan: [place]  ?  ? ?Problem Identified: HTN DMII HLD CAD Insomnia Idopathic Neuropathy   ?Priority: High  ?  ? ?Long-Range Goal: Disease Management   ?Start Date: 05/24/2021  ?Expected End Date: 05/24/2022  ?Recent Progress: On track  ?Priority: High  ?Note:   ?Current Barriers:  ?Unable to maintain control of DM II. neuropathy ? ?Pharmacist Clinical Goal(s):  ?Patient will contact provider office for questions/concerns as evidenced notation of same in electronic health record through collaboration with PharmD and provider.  ? ?Interventions: ?1:1 collaboration with Marin Olp, MD regarding development and update of comprehensive plan of care as evidenced by provider attestation and co-signature ?Inter-disciplinary care team collaboration (see longitudinal plan of care) ?Comprehensive medication review performed; medication list updated in electronic medical record ?No Rx changes  ? ?Hypertension (BP goal <130/80) ?-Controlled ?-Current treatment: ?Lisinopril 20 mg once daily ?-Current home readings: none provided ?-Denies hypotensive/hypertensive symptoms ?-Educated on Daily salt intake goal < 2300 mg; ?Symptoms of hypotension and importance of maintaining adequate hydration; ?-Counseled on diet and exercise extensively ?Recommended to continue current medication ? ?Hyperlipidemia: (LDL goal < 70) ?-Not ideally controlled ?-CAD, DM, HTN, HLD ?-Current treatment: ?Rosuvastatin 10 mg once daily (03/2021 start) ?-Medications previously tried: lovastatin  ?-Educated on Cholesterol goals;  ?Benefits of statin for ASCVD risk reduction; ?-Recommended to continue current medication ? ?Update 08/23/21 ?Tolerating Crestor fine. ?Has not had updated lipids since starting. ?Recommend labs to assess efficacy. ?No changes for now. ? ?Update 02/27/22 ?Continues same dose of Crestor, LDL almost at goal < 70 at most recent labs. ?Work on [employer], continue routine screenings. ?No changes needed at this time. ? ?Diabetes (A1c goal <7%) ?-Not ideally controlled. a1c 7.5% 03/2021 ?-Current medications: ?Metformin 1000 mg twice daily with meals Appropriate, Effective, Safe, Accessible ?Jardiance 10 mg once daily Appropriate, Query effective, ?-Current home glucose readings ?fasting glucose: not testing based off of preference, not limited by neuropathy ?-Denies hypoglycemic/hyperglycemic symptoms ?-Current meal patterns: intermittent fasting. Likes sweets. ?-Current exercise: walking limited by sore on foot ?-Educated on A1c and blood sugar goals; ?-Counseled to check feet daily and get yearly eye exams ?-Recommended to continue current medication ? ?Updated 08/23/21 ?No medication changes since last visit, patient is not checking sugars at home.  Diet remains the same. ?He has not had A1c rechecked since restarting the Jardiance. ?He did not qualify for PAP for Jardiance. ?Recommend recheck A1c within the next month or two.  Can titrate Jardiance if needed. ? ?Update 02/27/22 ?Patient most recent A1c was 8.1% he had a period where he was not taking his Jardiance due to cost ?barrier.  He is not checking glucose at home.  He has resumed Jardiance at '10mg'$ .  We discussed applying for Farxiga patient assistance - his is going to search for his medicare card and provide me with the number so that I can help him apply.  Could replace Jardiance with this if he qualifies. ?May need to titrate to higher dose of SGLT-2 to get patient closer to goal.  Discussed dietary changes. ?No changes at this time - apply for Farxiga PAP. ?Insomnia (Goal: optimize sleep hygiene) ?-Controlled ?-Current treatment  ?Zoldipem 5 mg once daily  ?-Reviewed side effects - no problems noted ?-Recommended to continue current medication ?Further review at 2 month f/u ? ?Patient Goals/Self-Care Activities ?Patient will:  ?--Focus on adherence ? ?Follow Up Plan:Fu 3  months ?  ? ?  ?  ? ?The  patient verbalized understanding of instructions, educational materials, and care plan provided today and declined offer to receive copy of patient instructions, educational materials, and care plan.  ?Telephone follow up appointment with pharmacy team member scheduled for: 6 months ? ?Edythe Clarity, [facility]  ?Beverly Milch, PharmD ?Clinical Pharmacist  ?[facility] ?([phone] ? ?

## 2022-03-01 NOTE — Progress Notes (Signed)
Applied for assistance program with medicare number.  Unfortunately patient does not meet income criteria to qualify for this program.  Will let patient know that he will have to stay on Jardiance and purchase with insurance.  Please let us know if he is having trouble affording. ? ?Beverly Milch, PharmD ?Clinical Pharmacist  ?[facility] ?([phone] ? ?

## 2022-03-03 ENCOUNTER — Ambulatory Visit: Payer: Medicare Other

## 2022-03-03 ENCOUNTER — Telehealth: Payer: Self-pay | Admitting: Family Medicine

## 2022-03-03 NOTE — Telephone Encounter (Signed)
LVM 03/03/22 to change appt time from 230p to 11:45a khc. Please confirm appt change.  Left number to call back Juliann Pulse [phone] ?

## 2022-03-17 ENCOUNTER — Other Ambulatory Visit: Payer: Self-pay

## 2022-03-17 ENCOUNTER — Ambulatory Visit (INDEPENDENT_AMBULATORY_CARE_PROVIDER_SITE_OTHER): Payer: Medicare Other

## 2022-03-17 DIAGNOSIS — Z0000 Encounter for general adult medical examination without abnormal findings: Secondary | ICD-10-CM

## 2022-03-17 NOTE — Progress Notes (Addendum)
Virtual Visit via Telephone Note ? ?I connected with  William Brewer on 03/17/22 at 11:30 AM EDT by telephone and verified that I am speaking with the correct person using two identifiers. ? ?Medicare Annual Wellness visit completed telephonically due to Covid-19 pandemic.  ? ?Persons participating in this call: This Health Coach and this patient.  ? ?Location: ?Patient: Home ?Provider: Office ?  ?I discussed the limitations, risks, security and privacy concerns of performing an evaluation and management service by telephone and the availability of in person appointments. The patient expressed understanding and agreed to proceed. ? ?Unable to perform video visit due to video visit attempted and failed and/or patient does not have video capability.  ? ?Some vital signs may be absent or patient reported.  ? ?Willette Brace, LPN ? ? ?Subjective:  ? William Brewer is a 76 y.o. male who presents for Medicare Annual/Subsequent preventive examination. ? ?Review of Systems    ? ?Cardiac Risk Factors include: advanced age (>41mn, >>29women);diabetes mellitus;male gender;hypertension;dyslipidemia ? ?   ?Objective:  ?  ?There were no vitals filed for this visit. ?There is no height or weight on file to calculate BMI. ? ? ?  03/17/2022  ? 11:37 AM 02/25/2021  ?  1:59 PM 06/23/2020  ?  3:56 PM 01/15/2020  ?  2:53 PM 01/08/2019  ?  3:22 PM 03/26/2018  ?  6:21 AM 03/15/2017  ? 10:14 AM  ?Advanced Directives  ?Does Patient Have a Medical Advance Directive? Yes No No No No No Yes  ?Type of Advance Directive Healthcare Power of Attorney        ?Copy of [place]? No - copy requested        ?Would patient like information on creating a medical advance directive?  No - Patient declined  Yes (MAU/Ambulatory/Procedural Areas - Information given) Yes (MAU/Ambulatory/Procedural Areas - Information given) No - Patient declined   ? ? ?Current Medications (verified) ?Outpatient Encounter Medications as of 03/17/2022   ?Medication Sig  ? aspirin EC 81 MG tablet Take by mouth daily.  ? empagliflozin (JARDIANCE) 10 MG TABS tablet Take 1 tablet (10 mg total) by mouth daily before breakfast.  ? gabapentin (NEURONTIN) 300 MG capsule TAKE 2 CAPSULES BY MOUTH THREE TIMES DAILY. MAX FOR HIS RENAL FUNCTION  ? lisinopril (ZESTRIL) 20 MG tablet Take 1 tablet by mouth once daily  ? Lysine HCl 1000 MG TABS Take 1,000 mg by mouth daily.  ? Magnesium 400 MG TABS Take 250 mg by mouth daily.  ? metFORMIN (GLUCOPHAGE) 1000 MG tablet TAKE 1 TABLET BY MOUTH TWICE DAILY WITH MEALS  ? rosuvastatin (CRESTOR) 10 MG tablet Take 1 tablet (10 mg total) by mouth daily.  ? valACYclovir (VALTREX) 1000 MG tablet TAKE TWO TABLETS BY MOUTH TWICE DAILY FOR 1 DAY AS NEEDED  ? zolpidem (AMBIEN) 5 MG tablet TAKE 1 TABLET BY MOUTH AT BEDTIME AS NEEDED FOR SLEEP  ? FLUZONE HIGH-DOSE QUADRIVALENT 0.7 ML SUSY   ? PFIZER COVID-19 VAC BIVALENT injection   ? PREVNAR 20 0.5 ML injection   ? [DISCONTINUED] becaplermin (REGRANEX) 0.01 % gel Apply 1 application topically daily. Apply daily to wound bed, cover with sterile gauze  ? [DISCONTINUED] benzonatate (TESSALON PERLES) 100 MG capsule Take 1 capsule (100 mg total) by mouth 3 (three) times daily as needed.  ? [DISCONTINUED] cephALEXin (KEFLEX) 500 MG capsule Take 1 capsule (500 mg total) by mouth 3 (three) times daily.  ? [DISCONTINUED]  mupirocin ointment (BACTROBAN) 2 % Apply 1 application topically 2 (two) times daily.  ? ?No facility-administered encounter medications on file as of 03/17/2022.  ? ? ?Allergies (verified) ?Patient has no known allergies.  ? ?History: ?Past Medical History:  ?Diagnosis Date  ? Allergy   ? Basal cell carcinoma   ? leg  ? Colon polyps   ? Diabetes mellitus without complication ([place])   ? DIVERTICULOSIS, COLON 05/26/2008  ? Hyperlipidemia   ? refused treatment  ? Hypertension   ? Peripheral neuropathy   ? ?Past Surgical History:  ?Procedure Laterality Date  ? APPENDECTOMY    ? COLONOSCOPY    ?  LEFT HEART CATH AND CORONARY ANGIOGRAPHY N/A 03/26/2018  ? Procedure: LEFT HEART CATH AND CORONARY ANGIOGRAPHY;  Surgeon: Jettie Booze, MD;  Location: [facility];  Service: Cardiovascular;  Laterality: N/A;  ? POLYPECTOMY    ? ULTRASOUND GUIDANCE FOR VASCULAR ACCESS  03/26/2018  ? Procedure: Ultrasound Guidance For Vascular Access;  Surgeon: Jettie Booze, MD;  Location: [facility];  Service: Cardiovascular;;  ? ?Family History  ?Adopted: Yes  ?Problem Relation Age of Onset  ? Deep vein thrombosis Mother   ?     cancer  ? Cancer Mother   ?     intestine  ? Heart disease Father 79  ?     CABG, former smoker but not heavy  ? Alzheimer's disease Maternal Grandmother   ? Aortic aneurysm Maternal Grandfather   ? Breast cancer Daughter   ? Hypertension Neg Hx   ? Colon cancer Neg Hx   ? ?Social History  ? ?Socioeconomic History  ? Marital status: Married  ?  Spouse name: Not on file  ? Number of children: Not on file  ? Years of education: Not on file  ? Highest education level: Not on file  ?Occupational History  ? Not on file  ?Tobacco Use  ? Smoking status: Never  ? Smokeless tobacco: Never  ?Vaping Use  ? Vaping Use: Never used  ?Substance and Sexual Activity  ? Alcohol use: Yes  ?  Alcohol/week: 5.0 - 6.0 standard drinks  ?  Types: 3 Cans of beer, 2 - 3 Standard drinks or equivalent per week  ?  Comment: 3 a week   ? Drug use: No  ? Sexual activity: Yes  ?Other Topics Concern  ? Not on file  ?Social History Narrative  ? Married 1973. 2 children. 2 grandkids. Son in [place]. Daughter 5 minutes away.   ?   ? Retired Worked form Event organiser  ?   ? Hobbies: support group for peripheral neuropathy, chess, words with friends, active with college-[employer] (board of advisors)  ? ?Social Determinants of Health  ? ?Financial Resource Strain: Low Risk   ? Difficulty of Paying Living Expenses: Not hard at all  ?Food Insecurity: No Food Insecurity  ? Worried About  Charity fundraiser in the Last Year: Never true  ? Ran Out of Food in the Last Year: Never true  ?Transportation Needs: No Transportation Needs  ? Lack of Transportation (Medical): No  ? Lack of Transportation (Non-Medical): No  ?Physical Activity: Insufficiently Active  ? Days of Exercise per Week: 2 days  ? Minutes of Exercise per Session: 50 min  ?Stress: No Stress Concern Present  ? Feeling of Stress : Not at all  ?Social Connections: Moderately Integrated  ? Frequency of Communication with Friends and Family: More than three  times a week  ? Frequency of Social Gatherings with Friends and Family: Twice a week  ? Attends Religious Services: Never  ? Active Member of Clubs or Organizations: Yes  ? Attends Archivist Meetings: 1 to 4 times per year  ? Marital Status: Married  ? ? ?Tobacco Counseling ?Counseling given: Not Answered ? ? ?Clinical Intake: ? ?Pre-visit preparation completed: Yes ? ?Pain : No/denies pain ? ?  ? ?BMI - recorded: 24.95 ?Nutritional Status: BMI of 19-24  Normal ?Diabetes: Yes ?CBG done?: No ?Did pt. bring in CBG monitor from home?: No ? ?How often do you need to have someone help you when you read instructions, pamphlets, or other written materials from your doctor or pharmacy?: 1 - Never ? ?Diabetic? Nutrition Risk Assessment: ? ?Has the patient had any N/V/D within the last 2 months?  No  ?Does the patient have any non-healing wounds?  No  ?Has the patient had any unintentional weight loss or weight gain?  No  ? ?Diabetes: ? ?Is the patient diabetic?  Yes  ?If diabetic, was a CBG obtained today?  No  ?Did the patient bring in their glucometer from home?  No  ?How often do you monitor your CBG's? N/A.  ? ?Financial Strains and Diabetes Management: ? ?Are you having any financial strains with the device, your supplies or your medication? No .  ?Does the patient want to be seen by Chronic Care Management for management of their diabetes?  No  ?Would the patient like to be  referred to a Nutritionist or for Diabetic Management?  No  ? ?Diabetic Exams: ? ?Diabetic Eye Exam: Completed 06/01/21 ?Diabetic Foot Exam: Completed 02/02/22 ? ? ?Interpreter Needed?: No ? ?Information entered by

## 2022-03-17 NOTE — Patient Instructions (Signed)
William Brewer ,/ ?Thank you for taking time to come for your Medicare Wellness Visit. I appreciate your ongoing commitment to your health goals. Please  review the following plan we discussed and let me know if I can assist you in the future.  ?Screening recommendations/referrals: ?Colonoscopy: Done 08/30/16 repeat every 10 years ?Recommended yearly ophthalmology/optometry visit for glaucoma screening and checkup ?Recommended yearly dental visit for hygiene and checkup ? ?Vaccinations: ?Influenza vaccine: Done 10/08/21 repeat every year  ?Pneumococcal vaccine: Up to date ?Tdap vaccine: Done 01/17/20 repeat every 10 years  ?Shingles vaccine: Shingrix discussed. Please contact your pharmacy for coverage information.    ?Covid-19: Completed 2/5, 3/2, 10/02/20 & 6/7, 10/31/21 ? ?Advanced directives: Please bring a copy of your health care power of attorney and living will to the office at your convenience. ? ?Conditions/risks identified: None at this time  ? ?Next appointment: Follow up in one year for your annual wellness visit.  ? ?Preventive Care 76 Years and Older, Male ?Preventive care refers to lifestyle choices and visits with your health care provider that can promote health and wellness. ?What does preventive care include? ?A yearly physical exam. This is also called an annual well check. ?Dental exams once or twice a year. ?Routine eye exams. Ask your health care provider how often you should have your eyes checked. ?Personal lifestyle choices, including: ?Daily care of your teeth and gums. ?Regular physical activity. ?Eating a healthy diet. ?Avoiding tobacco and drug use. ?Limiting alcohol use. ?Practicing safe sex. ?Taking low doses of aspirin every day. ?Taking vitamin and mineral supplements as recommended by your health care provider. ?What happens during an annual well check? ?The services and screenings done by your health care provider during your annual well check will depend on your age, overall health,  lifestyle risk factors, and family history of disease. ?Counseling  ?Your health care provider may ask you questions about your: ?Alcohol use. ?Tobacco use. ?Drug use. ?Emotional well-being. ?Home and relationship well-being. ?Sexual activity. ?Eating habits. ?History of falls. ?Memory and ability to understand (cognition). ?Work and work Statistician. ?Screening  ?You may have the following tests or measurements: ?Height, weight, and BMI. ?Blood pressure. ?Lipid and cholesterol levels. These may be checked every 5 years, or more frequently if you are over 12 years old. ?Skin check. ?Lung cancer screening. You may have this screening every year starting at age 26 if you have a 30-pack-year history of smoking and currently smoke or have quit within the past 15 years. ?Fecal occult blood test (FOBT) of the stool. You may have this test every year starting at age 61. ?Flexible sigmoidoscopy or colonoscopy. You may have a sigmoidoscopy every 5 years or a colonoscopy every 10 years starting at age 16. ?Prostate cancer screening. Recommendations will vary depending on your family history and other risks. ?Hepatitis C blood test. ?Hepatitis B blood test. ?Sexually transmitted disease (STD) testing. ?Diabetes screening. This is done by checking your blood sugar (glucose) after you have not eaten for a while (fasting). You may have this done every 1-3 years. ?Abdominal aortic aneurysm (AAA) screening. You may need this if you are a current or former smoker. ?Osteoporosis. You may be screened starting at age 15 if you are at high risk. ?Talk with your health care provider about your test results, treatment options, and if necessary, the need for more tests. ?Vaccines  ?Your health care provider may recommend certain vaccines, such as: ?Influenza vaccine. This is recommended every year. ?Tetanus, diphtheria, and acellular pertussis (  Tdap, Td) vaccine. You may need a Td booster every 10 years. ?Zoster vaccine. You may need this  after age 31. ?Pneumococcal 13-valent conjugate (PCV13) vaccine. One dose is recommended after age 35. ?Pneumococcal polysaccharide (PPSV23) vaccine. One dose is recommended after age 31. ?Talk to your health care provider about which screenings and vaccines you need and how often you need them. ?This information is not intended to replace advice given to you by your health care provider. Make sure you discuss any questions you have with your health care provider. ?Document Released: 01/07/2016 Document Revised: 08/30/2016 Document Reviewed: 10/12/2015 ?Elsevier Interactive Patient Education ? 2017 [place]. ? ?Fall Prevention in the Home ?Falls can cause injuries. They can happen to people of all ages. There are many things you can do to make your home safe and to help prevent falls. ?What can I do on the outside of my home? ?Regularly fix the edges of walkways and driveways and fix any cracks. ?Remove anything that might make you trip as you walk through a door, such as a raised step or threshold. ?Trim any bushes or trees on the path to your home. ?Use bright outdoor lighting. ?Clear any walking paths of anything that might make someone trip, such as rocks or tools. ?Regularly check to see if handrails are loose or broken. Make sure that both sides of any steps have handrails. ?Any raised decks and porches should have guardrails on the edges. ?Have any leaves, snow, or ice cleared regularly. ?Use sand or salt on walking paths during winter. ?Clean up any spills in your garage right away. This includes oil or grease spills. ?What can I do in the bathroom? ?Use night lights. ?Install grab bars by the toilet and in the tub and shower. Do not use towel bars as grab bars. ?Use non-skid mats or decals in the tub or shower. ?If you need to sit down in the shower, use a plastic, non-slip stool. ?Keep the floor dry. Clean up any water that spills on the floor as soon as it happens. ?Remove soap buildup in the tub or  shower regularly. ?Attach bath mats securely with double-sided non-slip rug tape. ?Do not have throw rugs and other things on the floor that can make you trip. ?What can I do in the bedroom? ?Use night lights. ?Make sure that you have a light by your bed that is easy to reach. ?Do not use any sheets or blankets that are too big for your bed. They should not hang down onto the floor. ?Have a firm chair that has side arms. You can use this for support while you get dressed. ?Do not have throw rugs and other things on the floor that can make you trip. ?What can I do in the kitchen? ?Clean up any spills right away. ?Avoid walking on wet floors. ?Keep items that you use a lot in easy-to-reach places. ?If you need to reach something above you, use a strong step stool that has a grab bar. ?Keep electrical cords out of the way. ?Do not use floor polish or wax that makes floors slippery. If you must use wax, use non-skid floor wax. ?Do not have throw rugs and other things on the floor that can make you trip. ?What can I do with my stairs? ?Do not leave any items on the stairs. ?Make sure that there are handrails on both sides of the stairs and use them. Fix handrails that are broken or loose. Make sure that handrails are  as long as the stairways. ?Check any carpeting to make sure that it is firmly attached to the stairs. Fix any carpet that is loose or worn. ?Avoid having throw rugs at the top or bottom of the stairs. If you do have throw rugs, attach them to the floor with carpet tape. ?Make sure that you have a light switch at the top of the stairs and the bottom of the stairs. If you do not have them, ask someone to add them for you. ?What else can I do to help prevent falls? ?Wear shoes that: ?Do not have high heels. ?Have rubber bottoms. ?Are comfortable and fit you well. ?Are closed at the toe. Do not wear sandals. ?If you use a stepladder: ?Make sure that it is fully opened. Do not climb a closed stepladder. ?Make  sure that both sides of the stepladder are locked into place. ?Ask someone to hold it for you, if possible. ?Clearly mark and make sure that you can see: ?Any grab bars or handrails. ?First and last steps.

## 2022-03-20 ENCOUNTER — Ambulatory Visit: Payer: Medicare Other | Admitting: Podiatry

## 2022-03-20 ENCOUNTER — Other Ambulatory Visit: Payer: Self-pay

## 2022-03-20 DIAGNOSIS — L97511 Non-pressure chronic ulcer of other part of right foot limited to breakdown of skin: Secondary | ICD-10-CM | POA: Diagnosis not present

## 2022-03-20 NOTE — Progress Notes (Signed)
?  Subjective:  ?Patient ID: William Brewer, male    DOB: 1945-12-27,  MRN: [id number] ? ?Follow-up on ulcer on right foot ? ?76 y.o. male returns with the above complaint. History confirmed with patient.  Blood sugar remains well controlled.  Doing much better he completed the antibiotics has been applying the antibiotic ointment ? ?Objective:  ?Physical Exam: ?warm, good capillary refill, normal DP and PT pulses and absent light touch and protective sensation bilateral plantar foot.  Onychomycosis x10 with significant brown discoloration of the left hallux and second toe.  Right hallux ulceration limited to partial breakdown of skin now, no cellulitis or purulence or signs of infection today ? ? ? ? ? ?Assessment:  ? ?1. Chronic ulcer of great toe of right foot, limited to breakdown of skin ([place])   ? ? ? ?Plan:  ?Patient was evaluated and treated and all questions answered. ? ?Ulcer right hallux ?Doing much better after taking the Keflex he is still using the antibiotic ointment he will continue this until it fully heals.  I will see him in 2 months for his at risk regular diabetic foot care visit ? ? ?. ? ? ?No follow-ups on file.  ? ? ?

## 2022-03-24 DIAGNOSIS — E782 Mixed hyperlipidemia: Secondary | ICD-10-CM | POA: Diagnosis not present

## 2022-03-24 DIAGNOSIS — E1142 Type 2 diabetes mellitus with diabetic polyneuropathy: Secondary | ICD-10-CM

## 2022-03-30 ENCOUNTER — Other Ambulatory Visit: Payer: Self-pay | Admitting: Family Medicine

## 2022-04-05 ENCOUNTER — Other Ambulatory Visit: Payer: Self-pay

## 2022-04-05 MED ORDER — ZOLPIDEM TARTRATE 5 MG PO TABS: 5.0000 mg | ORAL_TABLET | Freq: Every evening | ORAL | 5 refills | Status: DC | PRN

## 2022-04-13 ENCOUNTER — Other Ambulatory Visit: Payer: Self-pay | Admitting: Family Medicine

## 2022-04-15 IMAGING — CR DG CHEST 2V
2 series · 2 of 2 positions shown · non-contrast
Comparison: None.

CLINICAL DATA: Altered mental status.

EXAM:
CHEST - 2 VIEW

[w chest pa]
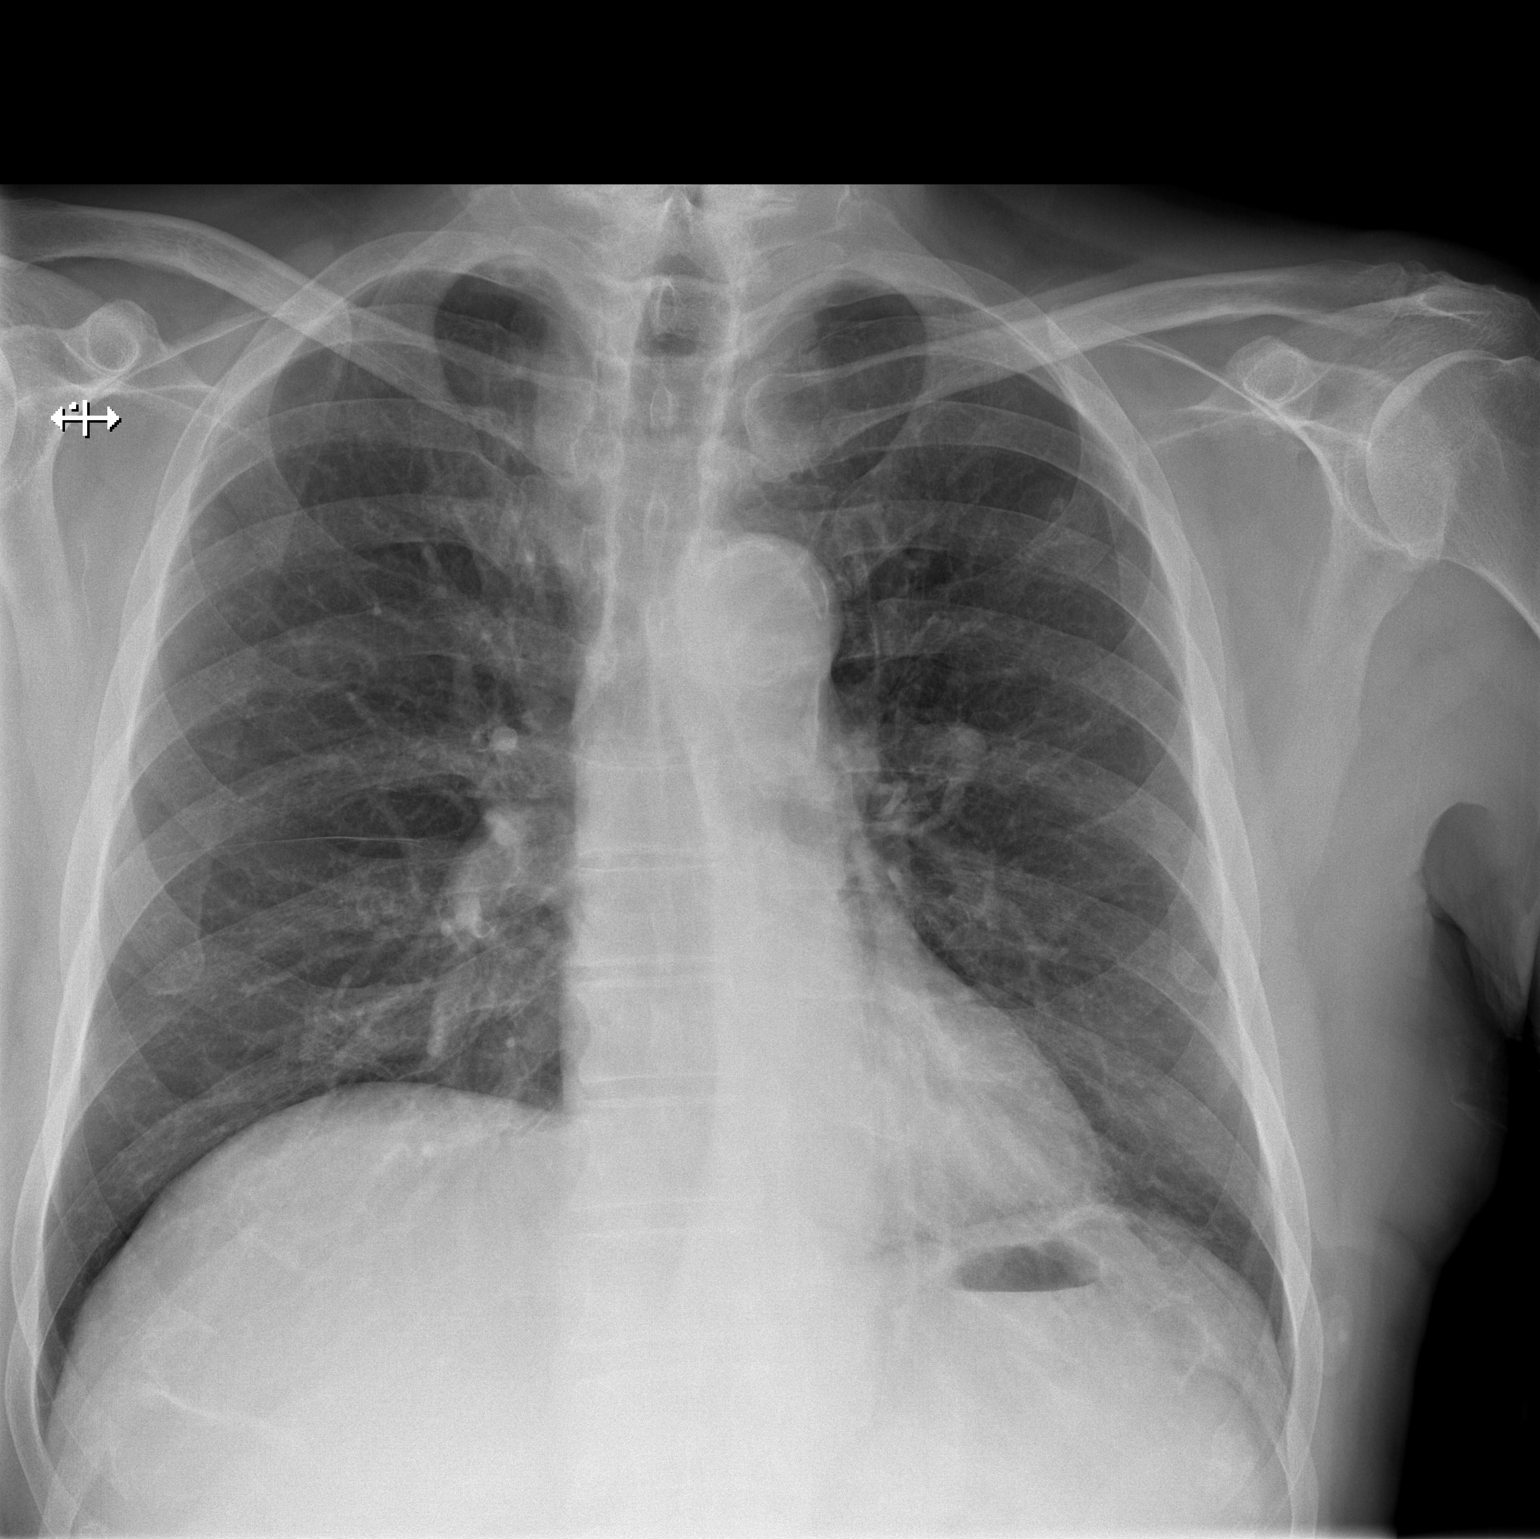

[w chest lat]
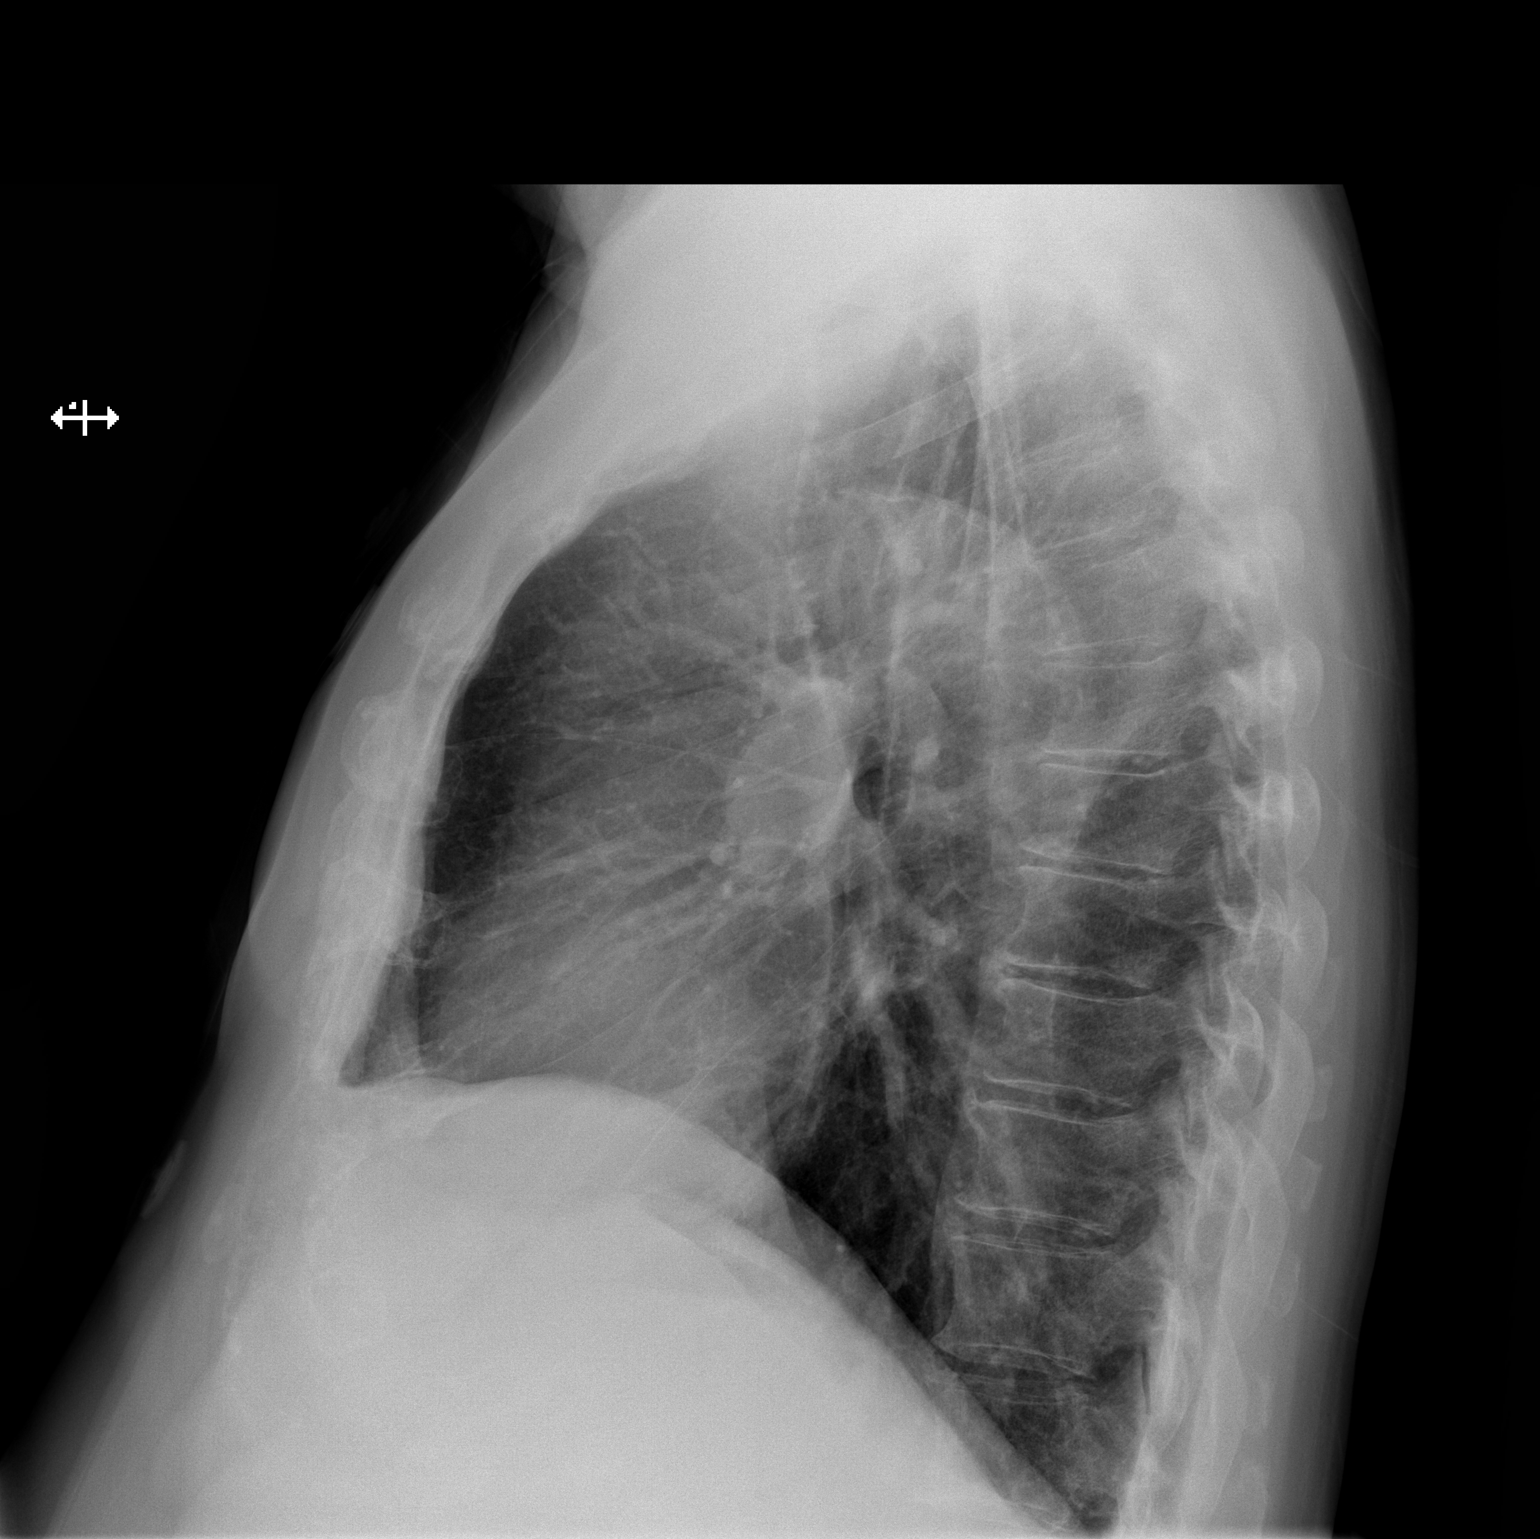

[2 of 2 positions shown; findings below may reference images not displayed]

FINDINGS: The heart size and mediastinal contours are within normal limits.
Both lungs are clear. The visualized skeletal structures are
unremarkable. Atherosclerotic changes are noted of the thoracic
aorta.
IMPRESSION: No active cardiopulmonary disease.

## 2022-05-19 ENCOUNTER — Telehealth: Payer: Self-pay | Admitting: Family Medicine

## 2022-05-19 NOTE — Telephone Encounter (Signed)
Team if symptoms are as severe as stated- 911 makes sense- doubt he could get into office if he cannot even get back into bed- sounds like he would need EMS transport and going to hospital would be appropriate disposition

## 2022-05-19 NOTE — Telephone Encounter (Signed)
EMS visit, oxygen administered, BP reading 160/70 pulse 92, glucose 243, o2 levels 94%. Standing BP 150/68, pulse 94. Didn't find any issues, appeared his vitals were ok except for glucose.

## 2022-05-19 NOTE — Telephone Encounter (Signed)
Spouse has called in stating that patient is going to wait until his appt with [facility] on 6/9.   I did offer to schedule patient with a different provider to get in sooner.  Appointment declined.

## 2022-05-19 NOTE — Telephone Encounter (Signed)
Patient Name: William Brewer Gender: Male DOB: 1946-09-14 Age: 76 Y 59 M 28 D Return Phone Number: [phone] (Primary), [phone] (Secondary) Address: City/ State/ Zip: [place]  [address] Client [facility] [place] [facility] Provider Garret Reddish- MD Contact Type Call Who Is Calling Patient / Member / Family / Caregiver Call Type Triage / Clinical Caller Name Hoyle Barkdull Relationship To Patient Spouse Return Phone Number [phone] (Primary) Chief Complaint Weakness, Generalized Reason for Call Symptomatic / Request for [place] states that she is transferring a patient's spouse over for triage. The patient is experiencing neuropathy pain that has become severe in legs, arms and hands. He is not eating but is drinking fluids, experiencing weakness, fell out of bed and cannot get back in the bed. Not in a good state. She is concerned he may have caught a cold as well. neuropathy pain. Translation No Nurse Assessment Nurse: Thad Ranger, RN, Denise Date/Time (Eastern Time): 05/19/2022 11:14:59 AM Confirm and document reason for call. If symptomatic, describe symptoms. ---Caller states that she is transferring a patient's spouse over for triage. The patient is experiencing neuropathy pain that has become severe in legs, arms and hands. He is not eating but is drinking fluids, experiencing weakness, fell out of bed and cannot get back in the bed. Not in a good state. She is concerned he may have caught a cold as well. neuropathy pain. He is very weak and these s/s are new. He is not able to walk or transfer without assistance and this just started today Does the patient have any new or worsening symptoms? ---Yes Will a triage be completed? ---Yes Related visit to physician within the last 2 weeks? ---No Does the PT have any chronic conditions?  (i.e. diabetes, asthma, this includes High risk factors for pregnancy, etc.) ---Yes List chronic conditions. ---Peripheral neuropathy, Diabetes  Nurse Assessment Is this a behavioral health or substance abuse call? ---No Guidelines Guideline Title Affirmed Question Affirmed Notes Nurse Date/Time (Eastern Time) Weakness (Generalized) and Fatigue Shock suspected (e.g., cold/pale/ clammy skin, too weak to stand, low BP, rapid pulse) Carmon, RN, Denise 05/19/2022 11:16:52 AM Disp. Time Eilene Ghazi Time) Disposition Final User 05/19/2022 11:25:04 AM 911 Outcome Documentation Carmon, RN, Langley Gauss Reason: EMS in route 05/19/2022 11:18:17 AM Call EMS 911 Now Yes Carmon, RN, Yevette Edwards Disagree/Comply Comply Caller Understands Yes PreDisposition Call Doctor Care Advice Given Per Guideline CALL EMS 911 NOW: [place] DOWN FOR SHOCK: * Lie down with the feet elevated. CARE ADVICE given per Weakness and Fatigue (Adult) guideline

## 2022-05-19 NOTE — Telephone Encounter (Signed)
Pt's spouse states pt is experiencing: neuropathy pain, weakness, fell out of bed, cannot get back in bed. Not in a good state. Spouse thinks he may have caught a cold from her.  Pt was referred to [facility] with the PCP Triage Nurse. Awaiting Team Health follow up nurse.

## 2022-05-19 NOTE — Telephone Encounter (Signed)
FYI

## 2022-05-23 ENCOUNTER — Ambulatory Visit (INDEPENDENT_AMBULATORY_CARE_PROVIDER_SITE_OTHER): Payer: Medicare Other | Admitting: Podiatry

## 2022-05-23 DIAGNOSIS — E1142 Type 2 diabetes mellitus with diabetic polyneuropathy: Secondary | ICD-10-CM | POA: Diagnosis not present

## 2022-05-23 DIAGNOSIS — M79674 Pain in right toe(s): Secondary | ICD-10-CM | POA: Diagnosis not present

## 2022-05-23 DIAGNOSIS — M79675 Pain in left toe(s): Secondary | ICD-10-CM | POA: Diagnosis not present

## 2022-05-23 DIAGNOSIS — B351 Tinea unguium: Secondary | ICD-10-CM | POA: Diagnosis not present

## 2022-05-23 NOTE — Progress Notes (Signed)
  Subjective:  Patient ID: William Brewer, male    DOB: 1946/02/12,  MRN: [id number]  Painful nails, at risk diabetic foot care  76 y.o. male returns with the above complaint. History confirmed with patient. Does not know last a1c, is seeing PCP this week. Nails are thickened and elongated again  Objective:  Physical Exam: warm, good capillary refill, normal DP and PT pulses and absent light touch and protective sensation bilateral plantar foot.  Onychomycosis x10 with significant brown discoloration of the left hallux and second toe. No recurrence of ulceration is noted      Assessment:   1. Pain due to onychomycosis of toenails of both feet   2. Type 2 diabetes mellitus with polyneuropathy ([place])       Plan:  Patient was evaluated and treated and all questions answered.  Discussed the etiology and treatment options for the condition in detail with the patient. Educated patient on the topical and oral treatment options for mycotic nails. Recommended debridement of the nails today. Sharp and mechanical debridement performed of all painful and mycotic nails today. Nails debrided in length and thickness using a nail nipper to level of comfort. Discussed treatment options including appropriate shoe gear. Follow up as needed for painful nails.  Patient educated on diabetes. Discussed proper diabetic foot care and discussed risks and complications of disease. Educated patient in depth on reasons to return to the office immediately should he/she discover anything concerning or new on the feet. All questions answered. Discussed proper shoes as well.    Return in about 3 months (around 08/23/2022) for at risk diabetic foot care.

## 2022-06-02 ENCOUNTER — Encounter: Payer: Self-pay | Admitting: Family Medicine

## 2022-06-02 ENCOUNTER — Ambulatory Visit (INDEPENDENT_AMBULATORY_CARE_PROVIDER_SITE_OTHER): Payer: Medicare Other | Admitting: Family Medicine

## 2022-06-02 ENCOUNTER — Telehealth: Payer: Self-pay | Admitting: Pharmacist

## 2022-06-02 VITALS — BP 112/64 | HR 68 | Temp 97.7°F | Ht 72.0 in | Wt 183.0 lb

## 2022-06-02 DIAGNOSIS — I700 Atherosclerosis of aorta: Secondary | ICD-10-CM

## 2022-06-02 DIAGNOSIS — G609 Hereditary and idiopathic neuropathy, unspecified: Secondary | ICD-10-CM | POA: Diagnosis not present

## 2022-06-02 DIAGNOSIS — E782 Mixed hyperlipidemia: Secondary | ICD-10-CM | POA: Diagnosis not present

## 2022-06-02 DIAGNOSIS — I10 Essential (primary) hypertension: Secondary | ICD-10-CM | POA: Diagnosis not present

## 2022-06-02 DIAGNOSIS — E1142 Type 2 diabetes mellitus with diabetic polyneuropathy: Secondary | ICD-10-CM | POA: Diagnosis not present

## 2022-06-02 DIAGNOSIS — I2510 Atherosclerotic heart disease of native coronary artery without angina pectoris: Secondary | ICD-10-CM

## 2022-06-02 DIAGNOSIS — Z79899 Other long term (current) drug therapy: Secondary | ICD-10-CM | POA: Diagnosis not present

## 2022-06-02 MED ORDER — EMPAGLIFLOZIN 25 MG PO TABS: 25.0000 mg | ORAL_TABLET | Freq: Every day | ORAL | 11 refills | Status: DC

## 2022-06-02 NOTE — Patient Instructions (Addendum)
Health Maintenance Due  Topic Date Due   Zoster Vaccines- Shingrix (1 of 2) Never done  Consider shingles shot at pharmacy  We will call you within two weeks about your referral to diabetes education. If you do not hear within 2 weeks, give [place] a call.    Increase Jardiance to 25 mg  B12/cyanocobalamin is low normal and could contribute to neuropathy as well as potential memory changes or imbalance-would recommend 1000 mcg daily for the next month and then at least once weekly after that  In regards to changing to Lyrica I would likely start at 50 mg 3 times a day though I suspect you may need 100 mg 3 times a day (max listed for neuropathy)  Recommended follow up: Return in about 4 months (around 10/02/2022) for followup or sooner if needed.Schedule b4 you leave.

## 2022-06-02 NOTE — Progress Notes (Signed)
Chronic Care Management Pharmacy Assistant   Name: William Brewer  MRN: [id number] DOB: 1946-06-13   Reason for Encounter: Diabetes Adherence Call    Recent office visits:  None since last CPP visit  Recent consult visits:  05/23/2022 OV (Podiatry) Criselda Peaches, DPM; no medication changes indicated.  03/20/2022 [facility] (Podiatry) Criselda Peaches, DPM; no medication changes indicated.  Hospital visits:  None in previous 6 months  Medications: Outpatient Encounter Medications as of 06/02/2022  Medication Sig   aspirin EC 81 MG tablet Take by mouth daily.   FLUZONE HIGH-DOSE QUADRIVALENT 0.7 ML SUSY    gabapentin (NEURONTIN) 300 MG capsule TAKE 2 CAPSULES BY MOUTH THREE TIMES DAILY. MAX FOR HIS RENAL FUNCTION   JARDIANCE 10 MG TABS tablet TAKE 1 TABLET BY MOUTH ONCE DAILY BEFORE BREAKFAST   lisinopril (ZESTRIL) 20 MG tablet Take 1 tablet by mouth once daily   Lysine HCl 1000 MG TABS Take 1,000 mg by mouth daily.   Magnesium 400 MG TABS Take 250 mg by mouth daily.   metFORMIN (GLUCOPHAGE) 1000 MG tablet TAKE 1 TABLET BY MOUTH TWICE DAILY WITH MEALS   PFIZER COVID-19 VAC BIVALENT injection    PREVNAR 20 0.5 ML injection    rosuvastatin (CRESTOR) 10 MG tablet Take 1 tablet by mouth once daily   valACYclovir (VALTREX) 1000 MG tablet TAKE TWO TABLETS BY MOUTH TWICE DAILY FOR 1 DAY AS NEEDED   zolpidem (AMBIEN) 5 MG tablet Take 1 tablet (5 mg total) by mouth at bedtime as needed. for sleep   No facility-administered encounter medications on file as of 06/02/2022.   Recent Relevant Labs: Lab Results  Component Value Date/Time   HGBA1C 8.1 (H) 02/02/2022 03:53 PM   HGBA1C 7.5 (H) 03/31/2021 01:29 PM    Kidney Function Lab Results  Component Value Date/Time   CREATININE 1.10 02/02/2022 03:53 PM   CREATININE 0.97 03/31/2021 01:29 PM   CREATININE 0.97 12/02/2020 01:44 PM   CREATININE 1.12 07/20/2020 12:06 PM   GFR 65.73 02/02/2022 03:53 PM   GFRNONAA 77 12/02/2020 01:44 PM    GFRAA 89 12/02/2020 01:44 PM    Current antihyperglycemic regimen:  Metformin 1000 mg twice daily Jardiance 10 mg daily  What recent interventions/DTPs have been made to improve glycemic control:  No recent interventions or DTPs.  Have there been any recent hospitalizations or ED visits since last visit with CPP? No  Patient denies hypoglycemic symptoms.  Patient denies hyperglycemic symptoms.  How often are you checking your blood sugar? Patient is not currently monitoring his blood sugars.  What are your blood sugars ranging?  None to report  During the week, how often does your blood glucose drop below 70? Never  Are you checking your feet daily/regularly?   Adherence Review: Is the patient currently on a STATIN medication? Yes Is the patient currently on ACE/ARB medication? Yes Does the patient have >5 day gap between last estimated fill dates? No   Care Gaps: Medicare Annual Wellness: Completed 03/17/2022 Ophthalmology Exam: Next due on 06/21/2022 Foot Exam: Next due on 02/02/2023 Hemoglobin A1C: 8.1% on 02/02/2022 Colonoscopy: Next due on 08/30/2026  Future Appointments  Date Time Provider [place]  06/02/2022  4:00 PM Marin Olp, MD [facility]  08/29/2022  2:15 PM Criselda Peaches, DPM [facility]  09/04/2022  3:45 PM [facility] CCM PHARMACIST [facility]  03/30/2023  2:00 PM [facility] HEALTH COACH [facility]   Star Rating Drugs: Jardiance 10 mg last filled  03/30/2022 90 DS Lisinopril 20 mg last filled 03/30/2022 90 DS Metformin 1000 mg last filled 04/13/2022 90 DS Rosuvastatin 10 mg last filled 03/30/2022 90 DS  April D Calhoun, [place] Pharmacist Assistant [phone]

## 2022-06-02 NOTE — Progress Notes (Signed)
Phone [phone] In person visit   Subjective:   William Brewer is a 76 y.o. year old very pleasant male patient who presents for/with See problem oriented charting Chief Complaint  Patient presents with   Follow-up   Hypertension   Diabetes   Past Medical History-  Patient Active Problem List   Diagnosis Date Noted   CAD (coronary artery disease) 11/08/2018    Priority: High   Diabetes mellitus type II, controlled ([place]) 09/23/2013    Priority: High   Idiopathic peripheral neuropathy 01/27/2009    Priority: High   Aortic atherosclerosis ([place]) 06/25/2020    Priority: Medium    Insomnia 11/08/2018    Priority: Medium    Right groin hernia 10/24/2016    Priority: Medium    Essential hypertension 03/26/2008    Priority: Medium    Hyperlipidemia 06/21/2007    Priority: Medium    Basal cell carcinoma of skin 10/28/2015    Priority: Low   Fever blister 08/10/2015    Priority: Low   History of colonic polyps 06/21/2007    Priority: Low    Medications- reviewed and updated Current Outpatient Medications  Medication Sig Dispense Refill   aspirin EC 81 MG tablet Take by mouth daily.     empagliflozin (JARDIANCE) 25 MG TABS tablet Take 1 tablet (25 mg total) by mouth daily before breakfast. 30 tablet 11   gabapentin (NEURONTIN) 300 MG capsule TAKE 2 CAPSULES BY MOUTH THREE TIMES DAILY. MAX FOR HIS RENAL FUNCTION 540 capsule 3   lisinopril (ZESTRIL) 20 MG tablet Take 1 tablet by mouth once daily 90 tablet 0   Lysine HCl 1000 MG TABS Take 1,000 mg by mouth daily.     Magnesium 400 MG TABS Take 250 mg by mouth daily.     metFORMIN (GLUCOPHAGE) 1000 MG tablet TAKE 1 TABLET BY MOUTH TWICE DAILY WITH MEALS 180 tablet 0   rosuvastatin (CRESTOR) 10 MG tablet Take 1 tablet by mouth once daily 90 tablet 0   valACYclovir (VALTREX) 1000 MG tablet TAKE TWO TABLETS BY MOUTH TWICE DAILY FOR 1 DAY AS NEEDED 20 tablet 3   zolpidem (AMBIEN) 5 MG tablet Take 1 tablet (5 mg total) by mouth at  bedtime as needed. for sleep 30 tablet 5   No current facility-administered medications for this visit.     Objective:  BP 112/64   Pulse 68   Temp 97.7 F (36.5 C)   Ht 6' (1.829 m)   Wt 183 lb (83 kg)   SpO2 98%   BMI 24.82 kg/m  Gen: NAD, resting comfortably CV: RRR no murmurs rubs or gallops Lungs: CTAB no crackles, wheeze, rhonchi Abdomen: soft/nontender/nondistended/normal bowel sounds.  Ext: no edema Skin: warm, dry     Assessment and Plan   # Fall/imbalance when feeling ill 2 weeks ago  S: episode on may 26th when called EMS. Wife is not sure if he fell out of bed or went to get in bed and slipped back down- she heard 2 thumps (wife states he was on the floor coherent but could not get up- did not feel strength to get up) . Wife had been sick with a cold. His neuropathy is usually worse when he is ill- worse when he is ill. Sugar level was 243 when EMS called. Bp was up to 150/68 on repeat. He refused ED visit which they recommended. He improved as day went on. Oxygen 94%. Did have incotinence that day but he does sometimes when he  is il.   He reports had an episode of slight aspiration night before and was feeling weak. He states felt very off balance getting into bed and fell to floor. Did not pass out. Memories are somewhat hazy on his side. Felt fatigue/weakness/balance issues/very thirsty.   Memory was not great for a few days. He is back to feeling normal again now.   Has had similar issues when ill in the past.   Did have low normal b12 last visit and he never started b12 as from last result note "B12 is low normal and could contribute to neuropathy-would recommend 1000 mcg daily for the next month and then at least once weekly after that"  Wife declines significant memory issues outside of illness A/P: It is unclear what transpired on this date-I recommended if he had recurrent symptoms to proceed to the emergency department as EMS suggested-he agrees.  We  discussed potential delirium related to illness.  Wife was worried about stroke.  Did not have focal symptoms that would allow me to order an MRI-ill-defined memory loss, generalized weakness.  The fact he has felt similar with prior illnesses makes me think this is less likely as well.  We also discussed with being on statin does reduce risk of stroke -Discussed potentially stopping Ambien due to increased fall risk and could have had some lingering effects the following morning-she declines making this change at this time  #CAD-nonobstructive based off CAD in 2019 #hyperlipidemia #Aortic atherosclerosis S: Medication: Rosuvastatin 10 mg, aspirin 81 mg Lab Results  Component Value Date   CHOL 144 03/31/2021   HDL 40.30 03/31/2021   LDLCALC 59 03/02/2020   LDLDIRECT 72.0 02/02/2022   TRIG 220.0 (H) 03/31/2021   CHOLHDL 4 03/31/2021    A/P: CAD nonobstructive remains asymptomatic-continue current medication and update lipid panel.  Prefer LDL under 70 For aortic atherosclerosis-prefer LDL goal under 70 just as for nonobstructive CAD- Lipids hopefully mildly improved-update full lipid panel  # Diabetes S: Medication: Metformin '1000Mg'$  twice daily, Jardiance '10Mg'$  CBGs- does not check due to painful neuropathy Exercise and diet- yoga twice a week, tries to walk on Wednesday, wants to start on treadmill and adjust diet Lab Results  Component Value Date   HGBA1C 8.1 (H) 02/02/2022   HGBA1C 7.5 (H) 03/31/2021   HGBA1C 6.9 (H) 12/02/2020   A/P: Poorly controlled and unfortunate did not appear to get the message about checking with insurance about tier 1 and tier 2 medications.  He asks about Rybelsus but discussed this would likely be more expensive and would be an add-on.  We opted to increase Jardiance to 25 mg - He also asked about changing to metformin extended release but has no significant side effects from instant release and we opted to continue current medications - Also refer to  diabetes education  #hypertension S: medication: Lisinopril '20Mg'$ .    BP Readings from Last 3 Encounters:  06/02/22 112/64  02/02/22 106/72  03/31/21 134/80   A/P: Well-controlled-continue to monitor-I did consider reducing lisinopril to 10 mg considering how good blood pressures have looked recently  #Idiopathic neuropathy predating diabetes S: Medication: Gabapentin 600 mg 3 times a day-max dose for his renal function-he actually takes 2100 mg a day with 3 tablets of the 300 mg in the morning, 3 tablets later in the day and 1 tablet before bed   Today patient reports ongoing issues and feels like it is no longer as effective -interested in switching to lyrica   A/P:  Poor control-he has some gabapentin still available and he wants to finish and then we discussed doing a direct conversion to Lyrica 50 mg 3 times a day-actually more comparable would appear to be 100 mg 3 times a day but 110 start lower and move up  Recommended follow up: Return in about 4 months (around 10/02/2022) for followup or sooner if needed.Schedule b4 you leave. Future Appointments  Date Time Provider [place]  08/29/2022  2:15 PM Criselda Peaches, DPM [facility]  09/04/2022  3:45 PM [facility] PHARMACIST [facility]  10/05/2022  1:00 PM Marin Olp, MD [facility]  03/30/2023  2:00 PM [facility] HEALTH COACH [facility]    Lab/Order associations:   ICD-10-CM   1. Controlled type 2 diabetes mellitus with diabetic polyneuropathy, without long-term current use of insulin (HCC)  E11.42 Comprehensive metabolic panel    Hemoglobin A1c    Hemoglobin A1c    Comprehensive metabolic panel    Amb Referral to Nutrition and Diabetic Education    2. High risk medication use  Z79.899     3. Mixed hyperlipidemia  E78.2 Lipid panel    Lipid panel    4. Idiopathic peripheral neuropathy  G60.9     5. Coronary artery disease involving native coronary artery of native heart without angina pectoris   I25.10     6. Essential hypertension  I10     7. Aortic atherosclerosis (HCC)  I70.0       Meds ordered this encounter  Medications   empagliflozin (JARDIANCE) 25 MG TABS tablet    Sig: Take 1 tablet (25 mg total) by mouth daily before breakfast.    Dispense:  30 tablet    Refill:  11    Time Spent: 45 minutes of total time (4:40 PM- 5:25 PM) was spent on the date of the encounter performing the following actions: chart review prior to seeing the patient, obtaining history, performing a medically necessary exam, counseling on the treatment plan, placing orders, and documenting in our EHR.     Return precautions advised.  Garret Reddish, MD

## 2022-06-03 LAB — COMPREHENSIVE METABOLIC PANEL
AG Ratio: 1.5 (calc) (ref 1.0–2.5)
ALT: 23 U/L (ref 9–46)
AST: 17 U/L (ref 10–35)
Albumin: 4.4 g/dL (ref 3.6–5.1)
Alkaline phosphatase (APISO): 59 U/L (ref 35–144)
BUN: 22 mg/dL (ref 7–25)
CO2: 23 mmol/L (ref 20–32)
Calcium: 9.4 mg/dL (ref 8.6–10.3)
Chloride: 101 mmol/L (ref 98–110)
Creat: 0.92 mg/dL (ref 0.70–1.28)
Globulin: 2.9 g/dL (calc) (ref 1.9–3.7)
Glucose, Bld: 229 mg/dL — ABNORMAL HIGH (ref 65–99)
Potassium: 4.1 mmol/L (ref 3.5–5.3)
Sodium: 137 mmol/L (ref 135–146)
Total Bilirubin: 0.4 mg/dL (ref 0.2–1.2)
Total Protein: 7.3 g/dL (ref 6.1–8.1)

## 2022-06-03 LAB — LIPID PANEL
Cholesterol: 114 mg/dL (ref <200)
HDL: 37 mg/dL — ABNORMAL LOW (ref >=40)
LDL Cholesterol (Calc): 46 mg/dL (calc)
Non-HDL Cholesterol (Calc): 77 mg/dL (calc) (ref <130)
Total CHOL/HDL Ratio: 3.1 (calc) (ref <5.0)
Triglycerides: 267 mg/dL — ABNORMAL HIGH (ref <150)

## 2022-06-03 LAB — HEMOGLOBIN A1C
Hgb A1c MFr Bld: 7.9 % of total Hgb — ABNORMAL HIGH (ref <5.7)
Mean Plasma Glucose: 180 mg/dL
eAG (mmol/L): 10 mmol/L

## 2022-06-08 DIAGNOSIS — L821 Other seborrheic keratosis: Secondary | ICD-10-CM | POA: Diagnosis not present

## 2022-06-08 DIAGNOSIS — D1801 Hemangioma of skin and subcutaneous tissue: Secondary | ICD-10-CM | POA: Diagnosis not present

## 2022-06-08 DIAGNOSIS — Z08 Encounter for follow-up examination after completed treatment for malignant neoplasm: Secondary | ICD-10-CM | POA: Diagnosis not present

## 2022-06-08 DIAGNOSIS — Z85828 Personal history of other malignant neoplasm of skin: Secondary | ICD-10-CM | POA: Diagnosis not present

## 2022-06-27 ENCOUNTER — Other Ambulatory Visit: Payer: Self-pay | Admitting: Family Medicine

## 2022-07-13 ENCOUNTER — Other Ambulatory Visit: Payer: Self-pay | Admitting: Family Medicine

## 2022-07-17 ENCOUNTER — Other Ambulatory Visit: Payer: Self-pay

## 2022-07-17 MED ORDER — METFORMIN HCL 1000 MG PO TABS: 1000.0000 mg | ORAL_TABLET | Freq: Two times a day (BID) | ORAL | 3 refills | Status: DC

## 2022-07-25 ENCOUNTER — Telehealth: Payer: Self-pay | Admitting: Family Medicine

## 2022-07-25 MED ORDER — PREGABALIN 50 MG PO CAPS: 50.0000 mg | ORAL_CAPSULE | Freq: Three times a day (TID) | ORAL | 3 refills | Status: DC

## 2022-07-25 NOTE — Telephone Encounter (Signed)
Patient states at last office visit he requested that when he ran out of Gabapentin that he would be prescribed Lyrica.Patient states he is out of Gabapentin.  Patient requests a new RX for Lyrica be sent to:   [place], [place] Phone:  [phone]  Fax:  [phone]

## 2022-07-25 NOTE — Telephone Encounter (Signed)
Bevelyn Ngo thanks for loading this-I sent this in

## 2022-07-25 NOTE — Progress Notes (Signed)
Chronic Care Management Pharmacy Note  07/31/2022 Name:  William Brewer MRN:  [id number] DOB:  Aug 20, 1946  Summary: William Brewer increased Jardiance well.  Not checking sugars at home.  Scheduled for FU in October for recheck.  Lyrica seems to be helping, neuropathy improving slowly.  Discussed taking time to reach steady state before making dose changes.  Recommendations: No changes at this time  FU: CMA to call in one week to assess pain  Subjective: William Brewer is an 76 y.o. year old male who is a primary patient of Hunter, Brayton Mars, MD.  The CCM team was consulted for assistance with disease management and care coordination needs.    Engaged with patient by telephone for follow up visit in response to provider referral for pharmacy case management and/or care coordination services.   Consent to Services:  The patient was given the following information about Chronic Care Management services today, agreed to services, and gave verbal consent: 1. CCM service includes personalized support from designated clinical staff supervised by the primary care provider, including individualized plan of care and coordination with other care providers 2. 24/7 contact phone numbers for assistance for urgent and routine care needs. 3. Service will only be billed when office clinical staff spend 20 minutes or more in a month to coordinate care. 4. Only one practitioner may furnish and bill the service in a calendar month. 5.The patient may stop CCM services at any time (effective at the end of the month) by phone call to the office staff. 6. The patient will be responsible for cost sharing (co-pay) of up to 20% of the service fee (after annual deductible is met). Patient agreed to services and consent obtained.   Patient Care Team: Marin Olp, MD as PCP - General (Family Medicine) Dorothy Spark, MD as PCP - Cardiology (Cardiology) Juluis Rainier as Consulting Physician (Optometry) Paulla Dolly,  Tamala Fothergill, DPM as Consulting Physician (Podiatry) Edythe Clarity, [facility] (Pharmacist)  Recent office visits:  None   Recent consult visits:  None   Hospital visits:  None in previous 6 months  Objective: Lab Results  Component Value Date   CREATININE 0.92 06/02/2022   CREATININE 1.10 02/02/2022   CREATININE 0.97 03/31/2021   Lab Results  Component Value Date   HGBA1C 7.9 (H) 06/02/2022   Last diabetic Eye exam:  Lab Results  Component Value Date/Time   HMDIABEYEEXA No Retinopathy 06/21/2021 12:00 AM    Last diabetic Foot exam: No results found for: "HMDIABFOOTEX"      Component Value Date/Time   CHOL 114 06/02/2022 1646   TRIG 267 (H) 06/02/2022 1646   HDL 37 (L) 06/02/2022 1646   CHOLHDL 3.1 06/02/2022 1646   VLDL 44.0 (H) 03/31/2021 1329   LDLCALC 46 06/02/2022 1646   LDLDIRECT 72.0 02/02/2022 1553       Latest Ref Rng & Units 06/02/2022    4:46 PM 02/02/2022    3:53 PM 03/31/2021    1:29 PM  Hepatic Function  Total Protein 6.1 - 8.1 g/dL 7.3  7.6  7.1   Albumin 3.5 - 5.2 g/dL  4.6  4.7   AST 10 - 35 U/L 17  29  35   ALT 9 - 46 U/L 23  37  41   Alk Phosphatase 39 - 117 U/L  49  57   Total Bilirubin 0.2 - 1.2 mg/dL 0.4  0.6  0.7     Lab Results  Component Value Date/Time  TSH 3.89 02/12/2018 08:45 AM   TSH 2.13 11/30/2014 10:03 AM       Latest Ref Rng & Units 02/02/2022    3:53 PM 03/31/2021    1:29 PM 07/20/2020   12:06 PM  CBC  WBC 4.0 - 10.5 K/uL 7.1  5.7  5.7   Hemoglobin 13.0 - 17.0 g/dL 15.2  15.3  14.8   Hematocrit 39.0 - 52.0 % 46.0  44.6  45.0   Platelets 150.0 - 400.0 K/uL 202.0  172.0  193     No results found for: "VD25OH"  Clinical ASCVD:  The ASCVD Risk score (Arnett DK, et al., 2019) failed to calculate for the following reasons:   The valid total cholesterol range is 130 to 320 mg/dL    Social History   Tobacco Use  Smoking Status Never  Smokeless Tobacco Never   BP Readings from Last 3 Encounters:  06/02/22 112/64   02/02/22 106/72  03/31/21 134/80   Pulse Readings from Last 3 Encounters:  06/02/22 68  02/02/22 72  03/31/21 67   Wt Readings from Last 3 Encounters:  06/02/22 183 lb (83 kg)  02/02/22 184 lb (83.5 kg)  03/31/21 187 lb 6.4 oz (85 kg)    Assessment: Review of patient past medical history, allergies, medications, health status, including review of consultants reports, laboratory and other test data, was performed as part of comprehensive evaluation and provision of chronic care management services.   SDOH:  (Social Determinants of Health) assessments and interventions performed: No, recently assessed  Financial Resource Strain: Low Risk  (03/17/2022)   Overall Financial Resource Strain (CARDIA)    Difficulty of Paying Living Expenses: Not hard at all     [place]  No Known Allergies  Medications Reviewed Today     Reviewed by Edythe Clarity, [facility] (Pharmacist) on 07/31/22 at 1336  Med List Status: <None>   Medication Order Taking? Sig Documenting Provider Last Dose Status Informant  aspirin EC 81 MG tablet [id number] Yes Take by mouth daily. [provider] Taking Active   empagliflozin (JARDIANCE) 25 MG TABS tablet [id number] Yes Take 1 tablet (25 mg total) by mouth daily before breakfast. Marin Olp, MD Taking Active   lisinopril (ZESTRIL) 20 MG tablet [id number] Yes Take 1 tablet by mouth once daily Marin Olp, MD Taking Active   Lysine HCl 1000 MG TABS [id number] Yes Take 1,000 mg by mouth daily. [provider] Taking Active Self  Magnesium 400 MG TABS [id number] Yes Take 250 mg by mouth daily. [provider] Taking Active Self  metFORMIN (GLUCOPHAGE) 1000 MG tablet [id number] Yes Take 1 tablet (1,000 mg total) by mouth 2 (two) times daily with a meal. Marin Olp, MD Taking Active   pregabalin (LYRICA) 50 MG capsule [id number] Yes Take 1 capsule (50 mg total) by mouth 3 (three) times daily. Marin Olp, MD Taking Active    rosuvastatin (CRESTOR) 10 MG tablet [id number] Yes Take 1 tablet by mouth once daily Marin Olp, MD Taking Active   valACYclovir (VALTREX) 1000 MG tablet [id number] Yes TAKE TWO TABLETS BY MOUTH TWICE DAILY FOR 1 DAY AS NEEDED Marin Olp, MD Taking Active   zolpidem (AMBIEN) 5 MG tablet [id number] Yes Take 1 tablet (5 mg total) by mouth at bedtime as needed. for sleep Tawnya Crook, MD Taking Active             Patient Active Problem List   Diagnosis Date Noted  Aortic atherosclerosis ([place]) 06/25/2020   Insomnia 11/08/2018   CAD (coronary artery disease) 11/08/2018   Right groin hernia 10/24/2016   Basal cell carcinoma of skin 10/28/2015   Fever blister 08/10/2015   Diabetes mellitus type II, controlled ([place]) 09/23/2013   Idiopathic peripheral neuropathy 01/27/2009   Essential hypertension 03/26/2008   Hyperlipidemia 06/21/2007   History of colonic polyps 06/21/2007    Immunization History  Administered Date(s) Administered   Fluad Quad(high Dose 65+) 10/16/2019, 12/02/2020   Influenza, High Dose Seasonal PF 10/24/2016, 02/12/2018, 11/08/2018, 10/08/2021   Influenza,inj,Quad PF,6+ Mos 09/23/2013, 11/27/2014, 10/28/2015   PFIZER(Purple Top)SARS-COV-2 Vaccination 01/30/2020, 02/24/2020, 10/02/2020, 05/31/2021   PNEUMOCOCCAL CONJUGATE-20 09/29/2021   Pfizer Covid-19 Vaccine Bivalent Booster 2yr & up 10/31/2021   Pneumococcal Conjugate-13 04/29/2015   Pneumococcal Polysaccharide-23 09/20/2012   Td 05/26/2008   Tdap 05/26/2008, 01/17/2020   Zoster Recombinat (Shingrix) 06/06/2022   Zoster, Live 09/20/2012   Conditions to be addressed/monitored: HTN DMII HLD CAD Insomnia Idopathic Neuropathy   Care Plan : [place] Updates made by DEdythe Clarity RPH since 07/31/2022 12:00 AM     Problem: HTN DMII HLD CAD Insomnia Idopathic Neuropathy   Priority: High     Long-Range Goal: Disease Management   Start Date: 05/24/2021  Expected End Date:  05/24/2022  Recent Progress: On track  Priority: High  Note:   Current Barriers:  Unable to maintain control of DM II. neuropathy  Pharmacist Clinical Goal(s):  Patient will contact provider office for questions/concerns as evidenced notation of same in electronic health record through collaboration with PharmD and provider.   Interventions: 1:1 collaboration with HMarin Olp MD regarding development and update of comprehensive plan of care as evidenced by provider attestation and co-signature Inter-disciplinary care team collaboration (see longitudinal plan of care) Comprehensive medication review performed; medication list updated in electronic medical record No Rx changes   Hypertension (BP goal <130/80) -Controlled, not assessed -Current treatment: Lisinopril 20 mg once daily -Current home readings: none provided -Denies hypotensive/hypertensive symptoms -Educated on Daily salt intake goal < 2300 mg; Symptoms of hypotension and importance of maintaining adequate hydration; -Counseled on diet and exercise extensively Recommended to continue current medication  Hyperlipidemia: (LDL goal < 70) 07/31/22 -Controlled -CAD, DM, HTN, HLD -Current treatment: Rosuvastatin 10 mg once daily (03/2021 start) Appropriate, Effective, Safe, Accessible -Medications previously tried: lovastatin  -Educated on Cholesterol goals;  Benefits of statin for ASCVD risk reduction; -Recommended to continue current medication -Most recent LDL shows adequate control.  Patient on appropriate dose for DM. -No changes, continue routine screenings.  Update 08/23/21 Tolerating Crestor fine. Has not had updated lipids since starting. Recommend labs to assess efficacy. No changes for now.  Update 02/27/22 Continues same dose of Crestor, LDL almost at goal < 70 at most recent labs. Work on [employer] continue routine screenings. No changes needed at this time.  Diabetes w/ Neuropathy (A1c goal  <7%) 07/31/22 -Not ideally controlled. a1c 7.9% most recently -Current medications: Metformin 1000 mg twice daily with meals Appropriate, Effective, Safe, Accessible Jardiance 25 mg once daily Appropriate, Query effective, -Current home glucose readings fasting glucose: not testing due to neuropathy -Denies hypoglycemic/hyperglycemic symptoms -Current meal patterns: intermittent fasting. Likes sweets. -Current exercise: same see previous -Educated on A1c and blood sugar goals; -Counseled to check feet daily and get yearly eye exams - due for eye exam -Recommended to continue current medication -Most recently started increased dose of Jardiance 268m- tolerating well.  Does not qualify  for any assistance due to income.  Continue current dose, recheck A1c at next OV. Started Lyrica to cut down on pill burden.  Says it was rough but improving.  Counseled on taking time to reach steady state.  Will have CMA check in one week to assess pain levels.  Updated 08/23/21 No medication changes since last visit, patient is not checking sugars at home.  Diet remains the same. He has not had A1c rechecked since restarting the Jardiance. He did not qualify for PAP for Jardiance. Recommend recheck A1c within the next month or two.  Can titrate Jardiance if needed.  Update 02/27/22 Patient most recent A1c was 8.1% he had a period where he was not taking his Jardiance due to cost barrier.  He is not checking glucose at home.  He has resumed Jardiance at 55m.  We discussed applying for Farxiga patient assistance - his is going to search for his medicare card and provide me with the number so that I can help him apply.  Could replace Jardiance with this if he qualifies. May need to titrate to higher dose of SGLT-2 to get patient closer to goal.  Discussed dietary changes. No changes at this time - apply for Farxiga PAP.  Insomnia (Goal: optimize sleep hygiene) -Controlled, not assessed -Current treatment   Zoldipem 5 mg once daily  -Reviewed side effects - no problems noted -Recommended to continue current medication Further review at 2 month f/u  Patient Goals/Self-Care Activities Patient will:  --Focus on adherence, improved pain from neuropathy  Follow Up Plan:Fu 6 months         Compliance/Adherence/Medication fill history: Care Gaps: Due for diabetic eye exam  Star-Rating Drugs: Lisinopril 220m07/05/23 90ds Metformin 100071m7/20/23 90ds Rosuvastatin 33m68m/05/23 90ds  Patient[facility] is:  [id number]: [phone]: [phone] pill box? Yes  Follow Up:  Patient agrees to Care Plan and Follow-up.   Future Appointments  Date Time Provider [place]/2023  2:15 PM McDoCriselda PeachesM [facility]  10/05/2022  1:00 PM HuntMarin Olp [facility]5/2024  2:00 PM [facility] Clinical Pharmacist  [facility] [phone]

## 2022-07-31 ENCOUNTER — Ambulatory Visit: Payer: Medicare Other | Admitting: Pharmacist

## 2022-07-31 DIAGNOSIS — E782 Mixed hyperlipidemia: Secondary | ICD-10-CM

## 2022-07-31 DIAGNOSIS — E1142 Type 2 diabetes mellitus with diabetic polyneuropathy: Secondary | ICD-10-CM

## 2022-07-31 NOTE — Patient Instructions (Addendum)
Visit Information   Goals Addressed             This Visit's Progress    patient   On track    Maintain your health!       Patient Care Plan: CCM Pharmacy Care Plan     Problem Identified: HTN DMII HLD CAD Insomnia Idopathic Neuropathy   Priority: High     Long-Range Goal: Disease Management   Start Date: 05/24/2021  Expected End Date: 05/24/2022  Recent Progress: On track  Priority: High  Note:   Current Barriers:  Unable to maintain control of DM II. neuropathy  Pharmacist Clinical Goal(s):  Patient will contact provider office for questions/concerns as evidenced notation of same in electronic health record through collaboration with PharmD and provider.   Interventions: 1:1 collaboration with Marin Olp, MD regarding development and update of comprehensive plan of care as evidenced by provider attestation and co-signature Inter-disciplinary care team collaboration (see longitudinal plan of care) Comprehensive medication review performed; medication list updated in electronic medical record No Rx changes   Hypertension (BP goal <130/80) -Controlled, not assessed -Current treatment: Lisinopril 20 mg once daily -Current home readings: none provided -Denies hypotensive/hypertensive symptoms -Educated on Daily salt intake goal < 2300 mg; Symptoms of hypotension and importance of maintaining adequate hydration; -Counseled on diet and exercise extensively Recommended to continue current medication  Hyperlipidemia: (LDL goal < 70) 07/31/22 -Controlled -CAD, DM, HTN, HLD -Current treatment: Rosuvastatin 10 mg once daily (03/2021 start) Appropriate, Effective, Safe, Accessible -Medications previously tried: lovastatin  -Educated on Cholesterol goals;  Benefits of statin for ASCVD risk reduction; -Recommended to continue current medication -Most recent LDL shows adequate control.  Patient on appropriate dose for DM. -No changes, continue routine  screenings.  Update 08/23/21 Tolerating Crestor fine. Has not had updated lipids since starting. Recommend labs to assess efficacy. No changes for now.  Update 02/27/22 Continues same dose of Crestor, LDL almost at goal < 70 at most recent labs. Work on [employer], continue routine screenings. No changes needed at this time.  Diabetes w/ Neuropathy (A1c goal <7%) 07/31/22 -Not ideally controlled. a1c 7.9% most recently -Current medications: Metformin 1000 mg twice daily with meals Appropriate, Effective, Safe, Accessible Jardiance 25 mg once daily Appropriate, Query effective, -Current home glucose readings fasting glucose: not testing due to neuropathy -Denies hypoglycemic/hyperglycemic symptoms -Current meal patterns: intermittent fasting. Likes sweets. -Current exercise: same see previous -Educated on A1c and blood sugar goals; -Counseled to check feet daily and get yearly eye exams - due for eye exam -Recommended to continue current medication -Most recently started increased dose of Jardiance '25mg'$  - tolerating well.  Does not qualify for any assistance due to income.  Continue current dose, recheck A1c at next OV. Started Lyrica to cut down on pill burden.  Says it was rough but improving.  Counseled on taking time to reach steady state.  Will have CMA check in one week to assess pain levels.  Updated 08/23/21 No medication changes since last visit, patient is not checking sugars at home.  Diet remains the same. He has not had A1c rechecked since restarting the Jardiance. He did not qualify for PAP for Jardiance. Recommend recheck A1c within the next month or two.  Can titrate Jardiance if needed.  Update 02/27/22 Patient most recent A1c was 8.1% he had a period where he was not taking his Jardiance due to cost barrier.  He is not checking glucose at home.  He has resumed [place]  at '10mg'$ .  We discussed applying for Farxiga patient assistance - his is going to search for  his medicare card and provide me with the number so that I can help him apply.  Could replace Jardiance with this if he qualifies. May need to titrate to higher dose of SGLT-2 to get patient closer to goal.  Discussed dietary changes. No changes at this time - apply for Farxiga PAP.  Insomnia (Goal: optimize sleep hygiene) -Controlled, not assessed -Current treatment  Zoldipem 5 mg once daily  -Reviewed side effects - no problems noted -Recommended to continue current medication Further review at 2 month f/u  Patient Goals/Self-Care Activities Patient will:  --Focus on adherence, improved pain from neuropathy  Follow Up Plan:Fu 6 months         The patient verbalized understanding of instructions, educational materials, and care plan provided today and DECLINED offer to receive copy of patient instructions, educational materials, and care plan.  Telephone follow up appointment with pharmacy team member scheduled for: 6 months  Edythe Clarity, [place], PharmD Clinical Pharmacist  [facility] [phone]

## 2022-08-04 ENCOUNTER — Telehealth: Payer: Self-pay | Admitting: Family Medicine

## 2022-08-04 MED ORDER — PREGABALIN 75 MG PO CAPS: 75.0000 mg | ORAL_CAPSULE | Freq: Three times a day (TID) | ORAL | 5 refills | Status: DC

## 2022-08-04 NOTE — Telephone Encounter (Signed)
See below

## 2022-08-04 NOTE — Telephone Encounter (Signed)
Patient stated that the pain has gotten worse after stopping gabapentin. Patient is taking 50 mg Lyrica 3 times daily, before he can get to next dose of lyrica, pain is bad and he has to supplement throughout the day with tylenol or ibuprofen. Please advise.

## 2022-08-04 NOTE — Telephone Encounter (Signed)
Please clarify further for me-did pain worsen after stopping gabapentin or is it simply just not more significant improvement?  We only recently made this adjustment I would be interested to see how he does with another week or 2 at this dose before making change

## 2022-08-04 NOTE — Telephone Encounter (Signed)
Lets try 75 mg 3 times a day-if he would like until he runs out can do the 50 mg 4 times a day and then step up to the 75 mg dose

## 2022-08-04 NOTE — Telephone Encounter (Signed)
Patient states pregabalin (LYRICA) 50 MG capsule is not working. Patient states he is still in a lot of pain and requests to either increase the dosage or frequency.

## 2022-08-07 DIAGNOSIS — E119 Type 2 diabetes mellitus without complications: Secondary | ICD-10-CM | POA: Diagnosis not present

## 2022-08-07 LAB — HM DIABETES EYE EXAM

## 2022-08-07 NOTE — Telephone Encounter (Signed)
Called and lm on pt vm tcb. 

## 2022-08-07 NOTE — Telephone Encounter (Signed)
Pt returned call and he has enough to do '50mg'$  4 times a day and will notify us when he gets low.

## 2022-08-08 ENCOUNTER — Telehealth: Payer: Self-pay | Admitting: Pharmacist

## 2022-08-08 NOTE — Progress Notes (Signed)
Chronic Care Management Pharmacy Assistant   Name: William Brewer  MRN: [id number] DOB: 05-13-1946   Reason for Encounter: General Adherence Call    Recent office visits:  None since last CPP visit  Recent consult visits:  None since last CPP visit  Hospital visits:  None in previous 6 months  Medications: Outpatient Encounter Medications as of 08/08/2022  Medication Sig   aspirin EC 81 MG tablet Take by mouth daily.   empagliflozin (JARDIANCE) 25 MG TABS tablet Take 1 tablet (25 mg total) by mouth daily before breakfast.   lisinopril (ZESTRIL) 20 MG tablet Take 1 tablet by mouth once daily   Lysine HCl 1000 MG TABS Take 1,000 mg by mouth daily.   Magnesium 400 MG TABS Take 250 mg by mouth daily.   metFORMIN (GLUCOPHAGE) 1000 MG tablet Take 1 tablet (1,000 mg total) by mouth 2 (two) times daily with a meal.   pregabalin (LYRICA) 75 MG capsule Take 1 capsule (75 mg total) by mouth 3 (three) times daily.   rosuvastatin (CRESTOR) 10 MG tablet Take 1 tablet by mouth once daily   valACYclovir (VALTREX) 1000 MG tablet TAKE TWO TABLETS BY MOUTH TWICE DAILY FOR 1 DAY AS NEEDED   zolpidem (AMBIEN) 5 MG tablet Take 1 tablet (5 mg total) by mouth at bedtime as needed. for sleep   No facility-administered encounter medications on file as of 08/08/2022.   [place] for General Review Call   Chart Review:  Have there been any documented new, changed, or discontinued medications since last visit? No  Has there been any documented recent hospitalizations or ED visits since last visit with Clinical Pharmacist? No   Adherence Review:  Does the Clinical Pharmacist Assistant have access to adherence rates? Yes Adherence rates for STAR metric medications: Lisinopril 20 mg last filled 06/28/2022 90 DS Does the patient have >5 day gap between last estimated fill dates for any of the above medications or other medication gaps? No Reason for medication gaps: None   Disease  State Questions:  Able to connect with Patient? Yes Did patient have any problems with their health recently? No Have you had any admissions or emergency room visits or worsening of your condition(s) since last visit? No Have you had any visits with new specialists or providers since your last visit? No Have you had any new health care problem(s) since your last visit? No Have you run out of any of your medications since you last spoke with clinical pharmacist? No Are there any medications you are not taking as prescribed? No Are you having any issues or side effects with your medications? No Do you have any other health concerns or questions you want to discuss with your Clinical Pharmacist before your next visit? No Are there any health concerns that you feel we can do a better job addressing? No Are you having any problems with any of the following since the last visit: (select all that apply)  None 12. Any falls since last visit? No 13. Any increased or uncontrolled pain since last visit? No 14. Additional Details? Patient states te neuropathy in his feet are improving. He has transitioned from Gabapentin to Lyrica. He states his pain has improved.   Care Gaps: Medicare Annual Wellness: Completed 03/17/2022 Ophthalmology Exam: Next due on 06/21/2022 Foot Exam: Next due on 02/02/2023 Hemoglobin A1C: 8.1% on 02/02/2022 Colonoscopy: Next due on 08/30/2026  Future Appointments  Date Time Provider [place]  08/29/2022  2:15 PM Criselda Peaches, DPM [facility]  10/05/2022  1:00 PM Marin Olp, MD [facility]  02/06/2023  2:00 PM [facility] CCM PHARMACIST [facility]  03/30/2023  2:00 PM [facility] HEALTH COACH [facility]   Star Rating Drugs: Lisinopril 20 mg last filled 06/28/2022 90 DS  April D Calh[place], [place] Pharmacist Assistant [phone]

## 2022-08-29 ENCOUNTER — Ambulatory Visit: Payer: Medicare Other | Admitting: Podiatry

## 2022-08-29 DIAGNOSIS — L84 Corns and callosities: Secondary | ICD-10-CM | POA: Diagnosis not present

## 2022-08-29 DIAGNOSIS — B351 Tinea unguium: Secondary | ICD-10-CM

## 2022-08-29 DIAGNOSIS — M79675 Pain in left toe(s): Secondary | ICD-10-CM | POA: Diagnosis not present

## 2022-08-29 DIAGNOSIS — M79674 Pain in right toe(s): Secondary | ICD-10-CM

## 2022-08-29 DIAGNOSIS — E1142 Type 2 diabetes mellitus with diabetic polyneuropathy: Secondary | ICD-10-CM

## 2022-08-31 NOTE — Progress Notes (Signed)
  Subjective:  Patient ID: William Brewer, male    DOB: 05/22/1946,  MRN: [id number]  Painful nails, at risk diabetic foot care  76 y.o. male returns with the above complaint. History confirmed with patient.  Nails are thickened elongated and becoming uncomfortable again, no recurrence of ulceration but he notes the calluses thickened  Objective:  Physical Exam: warm, good capillary refill, normal DP and PT pulses and absent light touch and protective sensation bilateral plantar foot.  Onychomycosis x10 with significant brown discoloration of the left hallux and second toe. No recurrence of ulceration is noted.  There is hyperkeratosis on the right plantar medial hallux      Assessment:   1. Pain due to onychomycosis of toenails of both feet   2. Type 2 diabetes mellitus with polyneuropathy (HCC)   3. Callus of foot       Plan:  Patient was evaluated and treated and all questions answered.  Discussed the etiology and treatment options for the condition in detail with the patient. Educated patient on the topical and oral treatment options for mycotic nails. Recommended debridement of the nails today. Sharp and mechanical debridement performed of all painful and mycotic nails today. Nails debrided in length and thickness using a nail nipper to level of comfort. Discussed treatment options including appropriate shoe gear. Follow up as needed for painful nails.  All symptomatic hyperkeratoses were safely debrided with a sterile #15 blade to patient's level of comfort without incident. We discussed preventative and palliative care of these lesions including supportive and accommodative shoegear, padding, prefabricated and custom molded accommodative orthoses, use of a pumice stone and lotions/creams daily.   Patient educated on diabetes. Discussed proper diabetic foot care and discussed risks and complications of disease. Educated patient in depth on reasons to return to the office  immediately should he/she discover anything concerning or new on the feet. All questions answered. Discussed proper shoes as well.    Return in about 3 months (around 11/28/2022) for at risk diabetic foot care.

## 2022-09-04 ENCOUNTER — Telehealth: Payer: Medicare Other

## 2022-09-18 ENCOUNTER — Encounter: Payer: Self-pay | Admitting: *Deleted

## 2022-10-02 ENCOUNTER — Other Ambulatory Visit: Payer: Self-pay | Admitting: Family Medicine

## 2022-10-05 ENCOUNTER — Ambulatory Visit (INDEPENDENT_AMBULATORY_CARE_PROVIDER_SITE_OTHER): Payer: Medicare Other | Admitting: Family Medicine

## 2022-10-05 ENCOUNTER — Other Ambulatory Visit: Payer: Self-pay | Admitting: Family Medicine

## 2022-10-05 ENCOUNTER — Encounter: Payer: Self-pay | Admitting: Family Medicine

## 2022-10-05 VITALS — BP 114/68 | HR 72 | Temp 97.8°F | Ht 72.0 in | Wt 184.4 lb

## 2022-10-05 DIAGNOSIS — G609 Hereditary and idiopathic neuropathy, unspecified: Secondary | ICD-10-CM | POA: Diagnosis not present

## 2022-10-05 DIAGNOSIS — E1142 Type 2 diabetes mellitus with diabetic polyneuropathy: Secondary | ICD-10-CM | POA: Diagnosis not present

## 2022-10-05 LAB — COMPREHENSIVE METABOLIC PANEL
ALT: 41 U/L (ref 0–53)
AST: 35 U/L (ref 0–37)
Albumin: 4.5 g/dL (ref 3.5–5.2)
Alkaline Phosphatase: 50 U/L (ref 39–117)
BUN: 28 mg/dL — ABNORMAL HIGH (ref 6–23)
CO2: 24 mEq/L (ref 19–32)
Calcium: 9.6 mg/dL (ref 8.4–10.5)
Chloride: 102 mEq/L (ref 96–112)
Creatinine, Ser: 1.09 mg/dL (ref 0.40–1.50)
GFR: 66.14 mL/min (ref >60.00)
Glucose, Bld: 170 mg/dL — ABNORMAL HIGH (ref 70–99)
Potassium: 4.3 mEq/L (ref 3.5–5.1)
Sodium: 137 mEq/L (ref 135–145)
Total Bilirubin: 0.7 mg/dL (ref 0.2–1.2)
Total Protein: 7.3 g/dL (ref 6.0–8.3)

## 2022-10-05 LAB — HEMOGLOBIN A1C: Hgb A1c MFr Bld: 8.4 % — ABNORMAL HIGH (ref 4.6–6.5)

## 2022-10-05 LAB — MICROALBUMIN / CREATININE URINE RATIO
Creatinine,U: 94.3 mg/dL
Microalb Creat Ratio: 0.7 mg/g (ref 0.0–30.0)
Microalb, Ur: <0.7 mg/dL (ref 0.0–1.9)

## 2022-10-05 MED ORDER — RYBELSUS 7 MG PO TABS: 7.0000 mg | ORAL_TABLET | Freq: Every day | ORAL | 5 refills | Status: DC

## 2022-10-05 MED ORDER — RYBELSUS 3 MG PO TABS: 3.0000 mg | ORAL_TABLET | Freq: Every day | ORAL | 0 refills | Status: DC

## 2022-10-05 NOTE — Patient Instructions (Addendum)
Let us know when you get your second [facility] vaccine.  for neuropathy- worsening control- he is going to go home and check his supply- believes on '50mg'$  at the moment mostly 3x a day and sometimes 4- he is going to finish supply if 50 mg at 4x a day and knows he has the 75 mg available at pharmacy to take 3x a day when completes   Diabetes education-Get on youtube and access videos by Dr. Smiley Houseman (an endocrinologist. Listen to diabetes education parts 1-5)  Please stop by lab before you go If you have mychart- we will send your results within 3 business days of [place] receiving them.  If you do not have mychart- we will call you about results within 5 business days of [place] receiving them.  *please also note that you will see labs on mychart as soon as they post. I will later go in and write notes on them- will say "notes from Dr. Yong Channel"   Recommended follow up: Return in about 14 weeks (around 01/11/2023) for followup or sooner if needed.Schedule b4 you leave.

## 2022-10-05 NOTE — Progress Notes (Signed)
Phone [phone] In person visit   Subjective:   William Brewer is a 76 y.o. year old very pleasant male patient who presents for/with See problem oriented charting Chief Complaint  Patient presents with   Follow-up   Diabetes   Hypertension   Past Medical History-  Patient Active Problem List   Diagnosis Date Noted   CAD (coronary artery disease) 11/08/2018    Priority: High   Diabetes mellitus type II, controlled ([place]) 09/23/2013    Priority: High   Idiopathic peripheral neuropathy 01/27/2009    Priority: High   Aortic atherosclerosis ([place]) 06/25/2020    Priority: Medium    Insomnia 11/08/2018    Priority: Medium    Right groin hernia 10/24/2016    Priority: Medium    Essential hypertension 03/26/2008    Priority: Medium    Hyperlipidemia 06/21/2007    Priority: Medium    Basal cell carcinoma of skin 10/28/2015    Priority: Low   Fever blister 08/10/2015    Priority: Low   History of colonic polyps 06/21/2007    Priority: Low    Medications- reviewed and updated Current Outpatient Medications  Medication Sig Dispense Refill   aspirin EC 81 MG tablet Take by mouth daily.     empagliflozin (JARDIANCE) 25 MG TABS tablet Take 1 tablet (25 mg total) by mouth daily before breakfast. 30 tablet 11   lisinopril (ZESTRIL) 20 MG tablet Take 1 tablet by mouth once daily 90 tablet 0   Lysine HCl 1000 MG TABS Take 1,000 mg by mouth daily.     Magnesium 400 MG TABS Take 250 mg by mouth daily.     metFORMIN (GLUCOPHAGE) 1000 MG tablet Take 1 tablet (1,000 mg total) by mouth 2 (two) times daily with a meal. 180 tablet 3   pregabalin (LYRICA) 75 MG capsule Take 1 capsule (75 mg total) by mouth 3 (three) times daily. 90 capsule 5   rosuvastatin (CRESTOR) 10 MG tablet Take 1 tablet by mouth once daily 90 tablet 0   valACYclovir (VALTREX) 1000 MG tablet TAKE TWO TABLETS BY MOUTH TWICE DAILY FOR 1 DAY AS NEEDED 20 tablet 3   zolpidem (AMBIEN) 5 MG tablet Take 1 tablet (5 mg total)  by mouth at bedtime as needed. for sleep 30 tablet 5   No current facility-administered medications for this visit.     Objective:  BP 114/68   Pulse 72   Temp 97.8 F (36.6 C)   Ht 6' (1.829 m)   Wt 184 lb 6.4 oz (83.6 kg)   SpO2 94%   BMI 25.01 kg/m  Gen: NAD, resting comfortably CV: RRR no murmurs rubs or gallops Lungs: CTAB no crackles, wheeze, rhonchi Ext: no edema Skin: warm, dry    Assessment and Plan   #CAD-nonobstructive based off CAD in 2019 #hyperlipidemia #Aortic atherosclerosis S: Medication: Rosuvastatin '10Mg'$ , aspirin 81 mg -no cp or sob Lab Results  Component Value Date   CHOL 114 06/02/2022   HDL 37 (L) 06/02/2022   LDLCALC 46 06/02/2022   LDLDIRECT 72.0 02/02/2022   TRIG 267 (H) 06/02/2022   CHOLHDL 3.1 06/02/2022    A/P: CAD nonobstructive -  asymptomatic- continue current meds For lipids - has ben reasonably well controlled- continue current meds   # Diabetes S: Medication: Metformin '1000Mg'$  twice daily, Jardiance '25Mg'$  -Last visit also referred to diabetes education-he reports he ended up deciding not to do it- was inconvenient . Doesn't do well without sugar CBGs- does not check due  to discomfort  -exercise ramped up- morning exercise and 12 minute minimum walk later in day plus twice a week yoga Lab Results  Component Value Date   HGBA1C 7.9 (H) 06/02/2022   HGBA1C 8.1 (H) 02/02/2022   HGBA1C 7.5 (H) 03/31/2021   A/P: hopefully improved- update a1c today. Continue current meds for now - wants to avoid needles -consider rybelsus -gave youtube videos on diabetes education  #hypertension S: medication: Lisinopril '20Mg'$ .  We previously stopped hydrochlorothiazide 12.5 mg due to orthostatic symptoms-was still having some mild symptoms last visit and we considered reducing to 10 mg BP Readings from Last 3 Encounters:  10/05/22 114/68  06/02/22 112/64  02/02/22 106/72  A/P: Controlled. Continue current medications.   #Idiopathic neuropathy  predating diabetes S: Medication: Lyrica 75 mg 3 times a day  -Previously gabapentin 600 mg 3 times a day-max dose for his renal function - he reports occasionally did extra 300  Today patient reports he thinks he is on 50 mg 3-4x a day- he is not sure he has received 75 mg yet.  If misses dose gets worsening pain.  A/P: for neuropathy- worsening control- he is going to go home and check his supply- believes on '50mg'$  at the moment mostly 3x a day and sometimes 4- he is going to finish supply if 50 mg at 4x a day and knows he has the 75 mg available at pharmacy to take 3x a day when completes    #Insomnia S: Medication: Ambien 5 mg- most nights  belsomra was too costly, trazodone not effective A/P: reasonable control- continue current meds- could try to use more sparingly  Recommended follow up: Return in about 14 weeks (around 01/11/2023) for followup or sooner if needed.Schedule b4 you leave. Future Appointments  Date Time Provider [place]  11/28/2022  2:15 PM Criselda Peaches, DPM [facility]  02/06/2023  2:00 PM [facility] CCM PHARMACIST [facility]  03/30/2023  2:00 PM [facility] HEALTH COACH [facility]    Lab/Order associations:   ICD-10-CM   1. Controlled type 2 diabetes mellitus with diabetic polyneuropathy, without long-term current use of insulin (HCC)  E11.42     2. Idiopathic peripheral neuropathy  G60.9       No orders of the defined types were placed in this encounter.   Return precautions advised.  Garret Reddish, MD

## 2022-10-13 ENCOUNTER — Other Ambulatory Visit: Payer: Self-pay | Admitting: Family Medicine

## 2022-10-13 MED ORDER — ZOLPIDEM TARTRATE 5 MG PO TABS: 5.0000 mg | ORAL_TABLET | Freq: Every evening | ORAL | 5 refills | Status: DC | PRN

## 2022-10-13 NOTE — Telephone Encounter (Signed)
Refill was ordered previously by [facility].     Encourage patient to contact the pharmacy for refills or they can request refills through [place]: 10/05/22  NEXT APPOINTMENT DATE:  MEDICATION: zolpidem (AMBIEN) 5 MG tablet   Is the patient out of medication?   PHARMACY:  [facility] [place] Phone: [phone]  Fax: [phone]      Let patient know to contact pharmacy at the end of the day to make sure medication is ready.  Please notify patient to allow 48-72 hours to process

## 2022-11-01 ENCOUNTER — Ambulatory Visit (INDEPENDENT_AMBULATORY_CARE_PROVIDER_SITE_OTHER): Payer: Medicare Other | Admitting: Family

## 2022-11-01 ENCOUNTER — Encounter: Payer: Self-pay | Admitting: Family

## 2022-11-01 ENCOUNTER — Telehealth: Payer: Self-pay

## 2022-11-01 VITALS — BP 112/69 | HR 67 | Temp 98.0°F | Ht 72.0 in | Wt 185.0 lb

## 2022-11-01 DIAGNOSIS — L03011 Cellulitis of right finger: Secondary | ICD-10-CM

## 2022-11-01 MED ORDER — DOXYCYCLINE HYCLATE 100 MG PO TABS: 100.0000 mg | ORAL_TABLET | Freq: Two times a day (BID) | ORAL | 0 refills | Status: AC

## 2022-11-01 NOTE — Progress Notes (Signed)
Patient ID: William Brewer, male    DOB: January 03, 1946, 76 y.o.   MRN: [id number]  Chief Complaint  Patient presents with   Finger problem    Pt c/o finger pain with yellow pus. Swollen, purple for a couple of days.     HPI:      Painful finger:  right index finger nailbed swollen, red, and 1/2 of nailbed filled with pus under the nail. He thinks it started with an ingrown nail, reports having in past and they get infected.   Assessment & Plan:  1. Infected nailbed of finger, right - index finger w/moderate amount of swelling, erythema and pus under nail. Advised pt he should get looked at by urgent care as it may need to be drained. Sending DOXY, advised on use & SE, can soak finger in warm water with epsom salt for up to 83mn 2-3x/day. Stop using hydrogen peroxide.   - doxycycline (VIBRA-TABS) 100 MG tablet; Take 1 tablet (100 mg total) by mouth 2 (two) times daily for 7 days.  Dispense: 14 tablet; Refill: 0   Subjective:    Outpatient Medications Prior to Visit  Medication Sig Dispense Refill   aspirin EC 81 MG tablet Take by mouth daily.     empagliflozin (JARDIANCE) 25 MG TABS tablet Take 1 tablet (25 mg total) by mouth daily before breakfast. 30 tablet 11   lisinopril (ZESTRIL) 20 MG tablet Take 1 tablet by mouth once daily 90 tablet 0   Lysine HCl 1000 MG TABS Take 1,000 mg by mouth daily.     Magnesium 400 MG TABS Take 250 mg by mouth daily.     metFORMIN (GLUCOPHAGE) 1000 MG tablet Take 1 tablet (1,000 mg total) by mouth 2 (two) times daily with a meal. 180 tablet 3   pregabalin (LYRICA) 75 MG capsule Take 1 capsule (75 mg total) by mouth 3 (three) times daily. 90 capsule 5   rosuvastatin (CRESTOR) 10 MG tablet Take 1 tablet by mouth once daily 90 tablet 0   Semaglutide (RYBELSUS) 3 MG TABS Take 3 mg by mouth daily. 1st 30 days only- start with 3 mg dose. 30 tablet 0   Semaglutide (RYBELSUS) 7 MG TABS Take 7 mg by mouth daily. Start after 30 days of 3 mg dose then continue.  30 tablet 5   valACYclovir (VALTREX) 1000 MG tablet TAKE TWO TABLETS BY MOUTH TWICE DAILY FOR 1 DAY AS NEEDED 20 tablet 3   zolpidem (AMBIEN) 5 MG tablet Take 1 tablet (5 mg total) by mouth at bedtime as needed. for sleep 30 tablet 5   No facility-administered medications prior to visit.   Past Medical History:  Diagnosis Date   Allergy    Basal cell carcinoma    leg   Colon polyps    Diabetes mellitus without complication ([place]    DIVERTICULOSIS, COLON 05/26/2008   Hyperlipidemia    refused treatment   Hypertension    Peripheral neuropathy    Past Surgical History:  Procedure Laterality Date   APPENDECTOMY     COLONOSCOPY     LEFT HEART CATH AND CORONARY ANGIOGRAPHY N/A 03/26/2018   Procedure: LEFT HEART CATH AND CORONARY ANGIOGRAPHY;  Surgeon: VJettie Booze MD;  [facility];  Service: Cardiovascular;  Laterality: N/A;   POLYPECTOMY     ULTRASOUND GUIDANCE FOR VASCULAR ACCESS  03/26/2018   Procedure: Ultrasound Guidance For Vascular Access;  Surgeon: VJettie Booze MD;  [facility];  Service: Cardiovascular;;   No Known Allergies    Objective:    Physical Exam Vitals and nursing note reviewed.  Constitutional:      General: He is not in acute distress.    Appearance: Normal appearance.  HENT:     Head: Normocephalic.  Cardiovascular:     Rate and Rhythm: Normal rate and regular rhythm.  Pulmonary:     Effort: Pulmonary effort is normal.     Breath sounds: Normal breath sounds.  Musculoskeletal:        General: Normal range of motion.     Cervical back: Normal range of motion.  Skin:    General: Skin is warm and dry.     Findings: Abscess (right index finger nailbed w/swelling, erythema, tenderness, pus under nailbed) present.  Neurological:     Mental Status: He is alert and oriented to person, place, and time.  Psychiatric:        Mood and Affect: Mood normal.    BP 112/69 (BP Location: Left Arm, Patient Position:  Sitting, Cuff Size: Large)   Pulse 67   Temp 98 F (36.7 C) (Temporal)   Ht 6' (1.829 m)   Wt 185 lb (83.9 kg)   SpO2 96%   BMI 25.09 kg/m  Wt Readings from Last 3 Encounters:  11/01/22 185 lb (83.9 kg)  10/05/22 184 lb 6.4 oz (83.6 kg)  06/02/22 183 lb (83 kg)       Jeanie Sewer, NP

## 2022-11-01 NOTE — Telephone Encounter (Signed)
I called pt and pt gave a verbalized understanding.

## 2022-11-01 NOTE — Patient Instructions (Signed)
It was very nice to see you today!  I have sent over an antibiotic for your infected finger. You can soak your finger in warm water with epsom salt twice a day for about 15 minutes. Do not use any more hydrogen peroxide or any antibiotic ointment/cream.  Due to the amount of pus under the nail, I would recommend going to urgent care if you are not getting relief within the next day or 2 so that it can be drained.        PLEASE NOTE:  If you had any lab tests please let us know if you have not heard back within a few days. You may see your results on MyChart before we have a chance to review them but we will give you a call once they are reviewed by [place]. If we ordered any referrals today, please let us know if you have not heard from their office within the next week.

## 2022-11-01 NOTE — Telephone Encounter (Signed)
-----   Message from Jeanie Sewer, NP sent at 11/01/2022 11:41 AM EST ----- Please call William Brewer and let him know after thinking more about his finger it will probably need to be drained due to the amount of pus under his nail and this can be done at one of our urgent care offices.  He should take 2 doses of the antibiotic I sent in today. Soak in warm epsom/salt water twice today as advised. If his finger gets more swollen or painful today or tomorrow, he should go to the urgent care. Thx

## 2022-11-28 ENCOUNTER — Ambulatory Visit: Payer: Medicare Other | Admitting: Podiatry

## 2022-11-28 DIAGNOSIS — B351 Tinea unguium: Secondary | ICD-10-CM | POA: Diagnosis not present

## 2022-11-28 DIAGNOSIS — M79674 Pain in right toe(s): Secondary | ICD-10-CM | POA: Diagnosis not present

## 2022-11-28 DIAGNOSIS — M79675 Pain in left toe(s): Secondary | ICD-10-CM

## 2022-11-28 DIAGNOSIS — E1142 Type 2 diabetes mellitus with diabetic polyneuropathy: Secondary | ICD-10-CM

## 2022-11-29 NOTE — Progress Notes (Signed)
  Subjective:  Patient ID: William Brewer, male    DOB: [birth year],  MRN: [id number]  Painful nails, at risk diabetic foot care  76 y.o. male returns with the above complaint. History confirmed with patient.  Doing very well he has had no recurrence of ulceration, the nails are thickened and elongated causing discomfort again.  Objective:  Physical Exam: warm, good capillary refill, normal DP and PT pulses and absent light touch and protective sensation bilateral plantar foot.  Onychomycosis x10 with significant brown discoloration of the left hallux and second toe. No recurrence of ulceration is noted.  Minimal callus or hyperkeratosis today      Assessment:   1. Pain due to onychomycosis of toenails of both feet   2. Type 2 diabetes mellitus with polyneuropathy ([place])       Plan:  Patient was evaluated and treated and all questions answered.  Discussed the etiology and treatment options for the condition in detail with the patient. Educated patient on the topical and oral treatment options for mycotic nails. Recommended debridement of the nails today. Sharp and mechanical debridement performed of all painful and mycotic nails today. Nails debrided in length and thickness using a nail nipper to level of comfort. Discussed treatment options including appropriate shoe gear. Follow up as needed for painful nails.     Return in about 3 months (around 02/27/2023) for painful nails .

## 2022-12-27 ENCOUNTER — Other Ambulatory Visit: Payer: Self-pay | Admitting: Family Medicine

## 2023-02-05 ENCOUNTER — Telehealth: Payer: Self-pay | Admitting: Pharmacist

## 2023-02-05 NOTE — Progress Notes (Signed)
Care Management & Coordination Services Pharmacy Team  Reason for Encounter: Appointment Reminder  Contacted patient to confirm telephone appointment with Leata Mouse, PharmD on 02/06/2023 at 12 pm. Spoke with patient on 02/05/2023    Star Rating Drugs:  Jardiance 25 mg last filled 11/28/2022 30 DS Lisinopril 20 mg last filled 10/02/2022 90 DS Metformin 1000 mg last filled 10/18/2022 90 DS Rosuvastatin 10 mg last filled 10/02/2022 90 DS   Care Gaps: Annual wellness visit in last year? Yes  If Diabetic: Last eye exam / retinopathy screening: 08/07/2022 Last diabetic foot exam: 02/02/2022   Future Appointments  Date Time Provider [place]  02/06/2023 12:00 PM [place] None  02/27/2023  2:15 PM Criselda Peaches, DPM [facility]  03/30/2023  2:00 PM [facility] HEALTH COACH [facility]  05/03/2023  3:20 PM Marin Olp, MD [facility]   April D Calhoun, [place] Pharmacist Assistant [phone]

## 2023-02-06 ENCOUNTER — Ambulatory Visit: Payer: Medicare Other | Admitting: Pharmacist

## 2023-02-06 ENCOUNTER — Ambulatory Visit: Payer: Medicare Other | Admitting: Family Medicine

## 2023-02-06 NOTE — Progress Notes (Signed)
Care Management & Coordination Services Pharmacy Note  02/06/2023 Name:  William Brewer MRN:  [id number] DOB:  May 27, 1946  Summary: -[name] increased to 8.4% - PCP recommended Rybelsus but he declined.  Is not checking glucose at home and is not really making food choices that are sugar conscious.  Admits he has gotten careless about it.  Counseled on impact elevated glucose can have on neuropathy.  He agrees to make small dietary changes to try to bring glucose down.  Recheck A1c when he comes in May.  He did state that if it was still elevated he would be more open to Rybelsus.  I can assist with PAP if he wants it.  We also discussed the possibility of trying Cymbalta for his neuropathy in addition to Lyrica.  He seemed open to give this a try.    Recommendations/Changes made from today's visit: Consider addition of Cymbalta - may also give him more motivation to work on his glucose Recheck A1c in may - re try Rybelsus if elevated  Follow up plan: Fu with me 3 months CMA to check on any medication new starts   Subjective: William Brewer is an 77 y.o. year old male who is a primary patient of William Brewer, William Mars, MD.  The care coordination team was consulted for assistance with disease management and care coordination needs.    Engaged with patient by telephone for follow up visit.  Recent office visits:  None since last CPP visit   Recent consult visits:  None since last CPP visit   Hospital visits:  None in previous 6 months   Objective:  Lab Results  Component Value Date   CREATININE 1.09 10/05/2022   BUN 28 (H) 10/05/2022   GFR 66.14 10/05/2022   GFRNONAA 77 12/02/2020   GFRAA 89 12/02/2020   NA 137 10/05/2022   K 4.3 10/05/2022   CALCIUM 9.6 10/05/2022   CO2 24 10/05/2022   GLUCOSE 170 (H) 10/05/2022     Lab Results  Component Value Date/Time   HGBA1C 8.4 (H) 10/05/2022 01:29 PM   HGBA1C 7.9 (H) 06/02/2022 04:46 PM   GFR 66.14 10/05/2022 01:29 PM   GFR  65.73 02/02/2022 03:53 PM   MICROALBUR <0.7 10/05/2022 01:29 PM    Last diabetic Eye exam:  Lab Results  Component Value Date/Time   HMDIABEYEEXA No Retinopathy 08/07/2022 12:00 AM    Last diabetic Foot exam: No results found for: "HMDIABFOOTEX"   Lab Results  Component Value Date   CHOL 114 06/02/2022   HDL 37 (L) 06/02/2022   LDLCALC 46 06/02/2022   LDLDIRECT 72.0 02/02/2022   TRIG 267 (H) 06/02/2022   CHOLHDL 3.1 06/02/2022       Latest Ref Rng & Units 10/05/2022    1:29 PM 06/02/2022    4:46 PM 02/02/2022    3:53 PM  Hepatic Function  Total Protein 6.0 - 8.3 g/dL 7.3  7.3  7.6   Albumin 3.5 - 5.2 g/dL 4.5   4.6   AST 0 - 37 U/L 35  17  29   ALT 0 - 53 U/L 41  23  37   Alk Phosphatase 39 - 117 U/L 50   49   Total Bilirubin 0.2 - 1.2 mg/dL 0.7  0.4  0.6     Lab Results  Component Value Date/Time   TSH 3.89 02/12/2018 08:45 AM   TSH 2.13 11/30/2014 10:03 AM       Latest Ref Rng & Units  02/02/2022    3:53 PM 03/31/2021    1:29 PM 07/20/2020   12:06 PM  CBC  WBC 4.0 - 10.5 K/uL 7.1  5.7  5.7   Hemoglobin 13.0 - 17.0 g/dL 15.2  15.3  14.8   Hematocrit 39.0 - 52.0 % 46.0  44.6  45.0   Platelets 150.0 - 400.0 K/uL 202.0  172.0  193     Lab Results  Component Value Date/Time   VITAMINB12 239 02/02/2022 03:53 PM   VITAMINB12 412 07/20/2020 12:06 PM    Clinical ASCVD: No  The ASCVD Risk score (Arnett DK, et al., 2019) failed to calculate for the following reasons:   The valid total cholesterol range is 130 to 320 mg/dL        03/17/2022   11:36 AM 02/02/2022    2:37 PM 02/25/2021    1:57 PM  Depression screen PHQ 2/9  Decreased Interest 0 0 0  Down, Depressed, Hopeless 0 0 0  PHQ - 2 Score 0 0 0  Altered sleeping  0   Tired, decreased energy  0   Change in appetite  0   Feeling bad or failure about yourself   0   Trouble concentrating  0   Moving slowly or fidgety/restless  0   Suicidal thoughts  0   PHQ-9 Score  0   Difficult doing work/chores  Not  difficult at all      Social History   Tobacco Use  Smoking Status Never  Smokeless Tobacco Never   BP Readings from Last 3 Encounters:  11/01/22 112/69  10/05/22 114/68  06/02/22 112/64   Pulse Readings from Last 3 Encounters:  11/01/22 67  10/05/22 72  06/02/22 68   Wt Readings from Last 3 Encounters:  11/01/22 185 lb (83.9 kg)  10/05/22 184 lb 6.4 oz (83.6 kg)  06/02/22 183 lb (83 kg)   BMI Readings from Last 3 Encounters:  11/01/22 25.09 kg/m  10/05/22 25.01 kg/m  06/02/22 24.82 kg/m    No Known Allergies  Medications Reviewed Today     Reviewed by Verne Grain, CMA (Certified Medical Assistant) on 11/01/22 at 1117  Med List Status: <None>   Medication Order Taking? Sig Documenting Provider Last Dose Status Informant  aspirin EC 81 MG tablet [id number] Yes Take by mouth daily. [provider] Taking Active   empagliflozin (JARDIANCE) 25 MG TABS tablet [id number] Yes Take 1 tablet (25 mg total) by mouth daily before breakfast. Marin Olp, MD Taking Active   lisinopril (ZESTRIL) 20 MG tablet [id number] Yes Take 1 tablet by mouth once daily Marin Olp, MD Taking Active   Lysine HCl 1000 MG TABS [id number] Yes Take 1,000 mg by mouth daily. [provider] Taking Active Self  Magnesium 400 MG TABS [id number] Yes Take 250 mg by mouth daily. [provider] Taking Active Self  metFORMIN (GLUCOPHAGE) 1000 MG tablet [id number] Yes Take 1 tablet (1,000 mg total) by mouth 2 (two) times daily with a meal. Marin Olp, MD Taking Active   pregabalin (LYRICA) 75 MG capsule [id number] Yes Take 1 capsule (75 mg total) by mouth 3 (three) times daily. Marin Olp, MD Taking Active   rosuvastatin (CRESTOR) 10 MG tablet [id number] Yes Take 1 tablet by mouth once daily Marin Olp, MD Taking Active   Semaglutide [facility]) 3 MG TABS [id number] Yes Take 3 mg by mouth daily. 1st 30 days only- start with 3 mg dose. Marin Olp, MD  Taking Active   Semaglutide (RYBELSUS) 7 MG TABS [id number] Yes Take 7 mg by mouth daily. Start after 30 days of 3 mg dose then continue. Marin Olp, MD Taking Active   valACYclovir (VALTREX) 1000 MG tablet [id number] Yes TAKE TWO TABLETS BY MOUTH TWICE DAILY FOR 1 DAY AS NEEDED Marin Olp, MD Taking Active   zolpidem (AMBIEN) 5 MG tablet [id number] Yes Take 1 tablet (5 mg total) by mouth at bedtime as needed. for sleep Marin Olp, MD Taking Active             SDOH:  (Social Determinants of Health) assessments and interventions performed: No, done within the past year  Financial Resource Strain: Low Risk  (03/17/2022)   Overall Financial Resource Strain (CARDIA)    Difficulty of Paying Living Expenses: Not hard at all   Food Insecurity: No Food Insecurity (03/17/2022)   Hunger Vital Sign    Worried About Running Out of Food in the Last Year: Never true    Ran Out of Food in the Last Year: Never true     Medication Assistance: None required.  Patient affirms current coverage meets needs.  Medication Access: Within the past 30 days, how often has patient missed a dose of medication? 0 Is a pillbox or other method used to improve adherence? No  Factors that may affect medication adherence? financial need and nonadherence to medications Are meds synced by current pharmacy? No  Are meds delivered by current pharmacy? No  Does patient experience delays in picking up medications due to transportation concerns? No   [facility] Services Reviewed: Is patient disadvantaged to use [facility]?: Yes  Current Rx insurance plan: [facility] Name and location of Current pharmacy:  [address], [place] [address] Phone: [phone] Fax: [phone]  [facility] services reviewed with patient today?: Yes  Patient requests to transfer care to [facility]?: No  Reason patient  declined to change pharmacies: Loyalty to other pharmacy/Patient preference  Compliance/Adherence/Medication fill history: [facility] Drugs:  Jardiance 25 mg last filled 11/28/2022 30 DS Lisinopril 20 mg last filled 10/02/2022 90 DS Metformin 1000 mg last filled 10/18/2022 90 DS Rosuvastatin 10 mg last filled 10/02/2022 90 DS     Care Gaps: Annual wellness visit in last year? Yes   If Diabetic: Last eye exam / retinopathy screening: 08/07/2022 Last diabetic foot exam: 02/02/2022   Assessment/Plan     Hypertension (BP goal <130/80) -Controlled, not assessed -Current treatment: Lisinopril 20 mg once daily -Current home readings: none provided -Denies hypotensive/hypertensive symptoms -Educated on Daily salt intake goal < 2300 mg; Symptoms of hypotension and importance of maintaining adequate hydration; -Counseled on diet and exercise extensively Recommended to continue current medication  Hyperlipidemia: (LDL goal < 70) 02/06/23 -Controlled, most recent LDL is 46 -CAD, DM, HTN, HLD -Current treatment: Rosuvastatin 10 mg once daily (03/2021 start) Appropriate, Effective, Safe, Accessible -Medications previously tried: lovastatin  -Educated on Cholesterol goals;  Benefits of statin for ASCVD risk reduction; -Recommended to continue current medication -Most recent LDL shows adequate control.  Patient on appropriate dose for DM. -I would make no changes at this time - continue same dose and continue to be adherent.  Update 08/23/21 Tolerating Crestor fine. Has not had updated lipids since starting. Recommend labs to assess efficacy. No changes for now.  Update 02/27/22 Continues same dose of Crestor, LDL almost at goal < 70 at most recent  labs. Work on [employer], continue routine screenings. No changes needed at this time.  Diabetes w/ Neuropathy (A1c goal <7%) 02/06/23 -Not ideally controlled. a1c 8.4% most recently -Current medications: Metformin 1000 mg twice  daily with meals Appropriate, Effective, Safe, Accessible Jardiance 25 mg once daily Appropriate, Query effective, Rybelsus 68m daily Appropriate, Query effective, ,  -Current home glucose readings fasting glucose: not testing due to neuropathy -Denies hypoglycemic/hyperglycemic symptoms -Current meal patterns: intermittent fasting. Likes sweets. -Current exercise: same see previous -Educated on A1c and blood sugar goals; -Counseled to check feet daily and get yearly eye exams - due for eye exam -Recommended to continue current medication -[name] increased to 8.4% - PCP recommended Rybelsus but he declined.  Is not checking glucose at home and is not really making food choices that are sugar conscious.  Admits he has gotten careless about it.  Counseled on impact elevated glucose can have on neuropathy.  He agrees to make small dietary changes to try to bring glucose down.  Recheck A1c when he comes in May.  He did state that if it was still elevated he would be more open to Rybelsus.  I can assist with PAP if he wants it.  We also discussed the possibility of trying Cymbalta for his neuropathy in addition to Lyrica.  He seemed open to give this a try.  Will discuss with PCP.  Updated 08/23/21 No medication changes since last visit, patient is not checking sugars at home.  Diet remains the same. He has not had A1c rechecked since restarting the Jardiance. He did not qualify for PAP for Jardiance. Recommend recheck A1c within the next month or two.  Can titrate Jardiance if needed.  Update 02/27/22 Patient most recent A1c was 8.1% he had a period where he was not taking his Jardiance due to cost barrier.  He is not checking glucose at home.  He has resumed Jardiance at 131m  We discussed applying for Farxiga patient assistance - his is going to search for his medicare card and provide me with the number so that I can help him apply.  Could replace Jardiance with this if he qualifies. May need  to titrate to higher dose of SGLT-2 to get patient closer to goal.  Discussed dietary changes. No changes at this time - apply for Farxiga PAP.  Insomnia (Goal: optimize sleep hygiene) -Controlled, not assessed -Current treatment  Zoldipem 5 mg once daily  -Reviewed side effects - no problems noted -Recommended to continue current medication Further review at 2 month f/u  Patient Goals/Self-Care Activities Patient will:  --Focus on adherence, improved pain from neuropathy  Follow Up Plan:Fu 6 months         ChBeverly MilchPharmD Clinical Pharmacist  [phone]

## 2023-02-14 ENCOUNTER — Other Ambulatory Visit: Payer: Self-pay | Admitting: Family Medicine

## 2023-02-16 ENCOUNTER — Other Ambulatory Visit: Payer: Self-pay

## 2023-02-16 ENCOUNTER — Other Ambulatory Visit: Payer: Self-pay | Admitting: Family Medicine

## 2023-02-16 MED ORDER — PREGABALIN 75 MG PO CAPS: 75.0000 mg | ORAL_CAPSULE | Freq: Three times a day (TID) | ORAL | 5 refills | Status: DC

## 2023-02-16 NOTE — Progress Notes (Signed)
I have filled this

## 2023-02-27 ENCOUNTER — Ambulatory Visit: Payer: Medicare Other | Admitting: Podiatry

## 2023-03-01 ENCOUNTER — Ambulatory Visit: Payer: Medicare Other | Admitting: Podiatry

## 2023-03-01 DIAGNOSIS — L84 Corns and callosities: Secondary | ICD-10-CM

## 2023-03-01 DIAGNOSIS — E1142 Type 2 diabetes mellitus with diabetic polyneuropathy: Secondary | ICD-10-CM | POA: Diagnosis not present

## 2023-03-01 DIAGNOSIS — M79674 Pain in right toe(s): Secondary | ICD-10-CM | POA: Diagnosis not present

## 2023-03-01 DIAGNOSIS — B351 Tinea unguium: Secondary | ICD-10-CM | POA: Diagnosis not present

## 2023-03-01 DIAGNOSIS — M79675 Pain in left toe(s): Secondary | ICD-10-CM | POA: Diagnosis not present

## 2023-03-01 NOTE — Progress Notes (Signed)
  Subjective:  Patient ID: William Brewer, male    DOB: 23-Apr-1946,  MRN: [id number]  Painful nails, at risk diabetic foot care  77 y.o. male returns with the above complaint. History confirmed with patient.  Doing very well he has had no recurrence of ulceration, there is some callus present here, the nails are thickened and elongated causing discomfort again.  Objective:  Physical Exam: warm, good capillary refill, normal DP and PT pulses and absent light touch and protective sensation bilateral plantar foot.  Onychomycosis x10 with significant brown discoloration of the left hallux and second toe.  Hyperkeratotic callus present plantar medial right hallux, not on the left today      Assessment:   1. Pain due to onychomycosis of toenails of both feet   2. Callus of foot   3. Type 2 diabetes mellitus with polyneuropathy ([place])       Plan:  Patient was evaluated and treated and all questions answered.  Discussed the etiology and treatment options for the condition in detail with the patient. Educated patient on the topical and oral treatment options for mycotic nails. Recommended debridement of the nails today. Sharp and mechanical debridement performed of all painful and mycotic nails today. Nails debrided in length and thickness using a nail nipper to level of comfort. Discussed treatment options including appropriate shoe gear. Follow up as needed for painful nails.  All symptomatic hyperkeratoses were safely debrided with a sterile #15 blade to patient's level of comfort without incident. We discussed preventative and palliative care of these lesions including supportive and accommodative shoegear, padding, prefabricated and custom molded accommodative orthoses, use of a pumice stone and lotions/creams daily.    Return in about 3 months (around 06/01/2023) for at risk diabetic foot care.

## 2023-03-13 ENCOUNTER — Telehealth: Payer: Self-pay | Admitting: Family Medicine

## 2023-03-13 NOTE — Telephone Encounter (Signed)
Copied from [place] [id number]. Topic: Medicare AWV >> Mar 13, 2023 10:34 AM Gillis Santa wrote: Reason for CRM: Called patient to schedule Medicare Annual Wellness Visit (AWV). Left message for patient to call back and schedule Medicare Annual Wellness Visit (AWV).  Last date of AWV: 03/17/2022  Please schedule an appointment at any time with Otila Kluver, [facility].  Please reschedule AWVS with health coach Otila Kluver, [place].  If any questions, please contact me at [phone].  Thank you ,  Shaune Pollack [facility] AWV TEAM Direct Dial [phone]

## 2023-03-16 ENCOUNTER — Other Ambulatory Visit: Payer: Self-pay | Admitting: Family Medicine

## 2023-03-17 ENCOUNTER — Other Ambulatory Visit: Payer: Self-pay | Admitting: Family Medicine

## 2023-03-19 ENCOUNTER — Telehealth: Payer: Self-pay | Admitting: Family Medicine

## 2023-03-19 NOTE — Telephone Encounter (Signed)
Patient states RX for pregabalin (LYRICA) 75 MG capsule was sent to wrong Pharmacy on 02/16/23.  Requests RX be sent to:  [place], [place] Phone: [phone]  Fax: [phone]

## 2023-03-19 NOTE — Telephone Encounter (Signed)
Rx faxed to below pharmacy.

## 2023-03-20 ENCOUNTER — Telehealth: Payer: Self-pay | Admitting: Family Medicine

## 2023-03-20 NOTE — Telephone Encounter (Signed)
Contacted Jam[facility] [place] to schedule their annual wellness visit. Appointment made for 03/27/2023.  [place] Direct Dial [phone]

## 2023-03-20 NOTE — Telephone Encounter (Signed)
Copied from [place] [id number] [id number]. Topic: Medicare AWV >> Mar 20, 2023  9:25 AM Gillis Santa wrote: Reason for CRM: Called patient to schedule Medicare Annual Wellness Visit (AWV). Left message for patient to call back and schedule Medicare Annual Wellness Visit (AWV).  Last date of AWV: 03/17/2022  Please schedule an appointment at any time with Otila Kluver, [facility]. Please reschedule AWVS with Otila Kluver, [place].    If any questions, please contact me at [phone].  Thank you ,  Shaune Pollack [facility] AWV TEAM Direct Dial [phone]

## 2023-03-26 ENCOUNTER — Other Ambulatory Visit: Payer: Self-pay

## 2023-03-26 MED ORDER — PREGABALIN 75 MG PO CAPS: 75.0000 mg | ORAL_CAPSULE | Freq: Three times a day (TID) | ORAL | 5 refills | Status: DC

## 2023-03-26 NOTE — Progress Notes (Signed)
Sent!

## 2023-03-27 ENCOUNTER — Ambulatory Visit (INDEPENDENT_AMBULATORY_CARE_PROVIDER_SITE_OTHER): Payer: Medicare Other

## 2023-03-27 VITALS — Wt 185.0 lb

## 2023-03-27 DIAGNOSIS — Z0000 Encounter for general adult medical examination without abnormal findings: Secondary | ICD-10-CM

## 2023-03-27 NOTE — Patient Instructions (Signed)
William Brewer , Thank you for taking time to come for your Medicare Wellness Visit. I appreciate your ongoing commitment to your health goals. Please review the following plan we discussed and let me know if I can assist you in the future.   These are the goals we discussed:  Goals      patient     Maintain your health!     Patient Stated     Lose weight      Patient Stated     None at this time      Patient Stated     None at this time         This is a list of the screening recommended for you and due dates:  Health Maintenance  Topic Date Due   Zoster (Shingles) Vaccine (2 of 2) 08/01/2022   COVID-19 Vaccine (7 - 2023-24 season) 11/22/2022   Complete foot exam   02/02/2023   Hemoglobin A1C  04/06/2023   Flu Shot  07/26/2023   Eye exam for diabetics  08/08/2023   Yearly kidney function blood test for diabetes  10/06/2023   Yearly kidney health urinalysis for diabetes  10/06/2023   Medicare Annual Wellness Visit  03/26/2024   DTaP/Tdap/Td vaccine (4 - Td or Tdap) 01/16/2030   Pneumonia Vaccine  Completed   Hepatitis C Screening: USPSTF Recommendation to screen - Ages 23-79 yo.  Completed   HPV Vaccine  Aged Out   Colon Cancer Screening  Discontinued    Advanced directives: Please bring a copy of your health care power of attorney and living will to the office at your convenience.  Conditions/risks identified: none at this time   Next appointment: Follow up in one year for your annual wellness visit.   Preventive Care 23 Years and Older, Male  Preventive care refers to lifestyle choices and visits with your health care provider that can promote health and wellness. What does preventive care include? A yearly physical exam. This is also called an annual well check. Dental exams once or twice a year. Routine eye exams. Ask your health care provider how often you should have your eyes checked. Personal lifestyle choices, including: Daily care of your teeth and  gums. Regular physical activity. Eating a healthy diet. Avoiding tobacco and drug use. Limiting alcohol use. Practicing safe sex. Taking low doses of aspirin every day. Taking vitamin and mineral supplements as recommended by your health care provider. What happens during an annual well check? The services and screenings done by your health care provider during your annual well check will depend on your age, overall health, lifestyle risk factors, and family history of disease. Counseling  Your health care provider may ask you questions about your: Alcohol use. Tobacco use. Drug use. Emotional well-being. Home and relationship well-being. Sexual activity. Eating habits. History of falls. Memory and ability to understand (cognition). Work and work Statistician. Screening  You may have the following tests or measurements: Height, weight, and BMI. Blood pressure. Lipid and cholesterol levels. These may be checked every 5 years, or more frequently if you are over 12 years old. Skin check. Lung cancer screening. You may have this screening every year starting at age 37 if you have a 30-pack-year history of smoking and currently smoke or have quit within the past 15 years. Fecal occult blood test (FOBT) of the stool. You may have this test every year starting at age 8. Flexible sigmoidoscopy or colonoscopy. You may have a sigmoidoscopy every 5  years or a colonoscopy every 10 years starting at age 88. Prostate cancer screening. Recommendations will vary depending on your family history and other risks. Hepatitis C blood test. Hepatitis B blood test. Sexually transmitted disease (STD) testing. Diabetes screening. This is done by checking your blood sugar (glucose) after you have not eaten for a while (fasting). You may have this done every 1-3 years. Abdominal aortic aneurysm (AAA) screening. You may need this if you are a current or former smoker. Osteoporosis. You may be screened  starting at age 24 if you are at high risk. Talk with your health care provider about your test results, treatment options, and if necessary, the need for more tests. Vaccines  Your health care provider may recommend certain vaccines, such as: Influenza vaccine. This is recommended every year. Tetanus, diphtheria, and acellular pertussis (Tdap, Td) vaccine. You may need a Td booster every 10 years. Zoster vaccine. You may need this after age 101. Pneumococcal 13-valent conjugate (PCV13) vaccine. One dose is recommended after age 30. Pneumococcal polysaccharide (PPSV23) vaccine. One dose is recommended after age 41. Talk to your health care provider about which screenings and vaccines you need and how often you need them. This information is not intended to replace advice given to you by your health care provider. Make sure you discuss any questions you have with your health care provider. Document Released: 01/07/2016 Document Revised: 08/30/2016 Document Reviewed: 10/12/2015 Elsevier Interactive Patient Education  2017 [employer] Prevention in the Home Falls can cause injuries. They can happen to people of all ages. There are many things you can do to make your home safe and to help prevent falls. What can I do on the outside of my home? Regularly fix the edges of walkways and driveways and fix any cracks. Remove anything that might make you trip as you walk through a door, such as a raised step or threshold. Trim any bushes or trees on the path to your home. Use bright outdoor lighting. Clear any walking paths of anything that might make someone trip, such as rocks or tools. Regularly check to see if handrails are loose or broken. Make sure that both sides of any steps have handrails. Any raised decks and porches should have guardrails on the edges. Have any leaves, snow, or ice cleared regularly. Use sand or salt on walking paths during winter. Clean up any spills in your garage  right away. This includes oil or grease spills. What can I do in the bathroom? Use night lights. Install grab bars by the toilet and in the tub and shower. Do not use towel bars as grab bars. Use non-skid mats or decals in the tub or shower. If you need to sit down in the shower, use a plastic, non-slip stool. Keep the floor dry. Clean up any water that spills on the floor as soon as it happens. Remove soap buildup in the tub or shower regularly. Attach bath mats securely with double-sided non-slip rug tape. Do not have throw rugs and other things on the floor that can make you trip. What can I do in the bedroom? Use night lights. Make sure that you have a light by your bed that is easy to reach. Do not use any sheets or blankets that are too big for your bed. They should not hang down onto the floor. Have a firm chair that has side arms. You can use this for support while you get dressed. Do not have throw rugs  and other things on the floor that can make you trip. What can I do in the kitchen? Clean up any spills right away. Avoid walking on wet floors. Keep items that you use a lot in easy-to-reach places. If you need to reach something above you, use a strong step stool that has a grab bar. Keep electrical cords out of the way. Do not use floor polish or wax that makes floors slippery. If you must use wax, use non-skid floor wax. Do not have throw rugs and other things on the floor that can make you trip. What can I do with my stairs? Do not leave any items on the stairs. Make sure that there are handrails on both sides of the stairs and use them. Fix handrails that are broken or loose. Make sure that handrails are as long as the stairways. Check any carpeting to make sure that it is firmly attached to the stairs. Fix any carpet that is loose or worn. Avoid having throw rugs at the top or bottom of the stairs. If you do have throw rugs, attach them to the floor with carpet tape. Make  sure that you have a light switch at the top of the stairs and the bottom of the stairs. If you do not have them, ask someone to add them for you. What else can I do to help prevent falls? Wear shoes that: Do not have high heels. Have rubber bottoms. Are comfortable and fit you well. Are closed at the toe. Do not wear sandals. If you use a stepladder: Make sure that it is fully opened. Do not climb a closed stepladder. Make sure that both sides of the stepladder are locked into place. Ask someone to hold it for you, if possible. Clearly mark and make sure that you can see: Any grab bars or handrails. First and last steps. Where the edge of each step is. Use tools that help you move around (mobility aids) if they are needed. These include: Canes. Walkers. Scooters. Crutches. Turn on the lights when you go into a dark area. Replace any light bulbs as soon as they burn out. Set up your furniture so you have a clear path. Avoid moving your furniture around. If any of your floors are uneven, fix them. If there are any pets around you, be aware of where they are. Review your medicines with your doctor. Some medicines can make you feel dizzy. This can increase your chance of falling. Ask your doctor what other things that you can do to help prevent falls. This information is not intended to replace advice given to you by your health care provider. Make sure you discuss any questions you have with your health care provider. Document Released: 10/07/2009 Document Revised: 05/18/2016 Document Reviewed: 01/15/2015 Elsevier Interactive Patient Education  2017 [employer].

## 2023-03-27 NOTE — Progress Notes (Signed)
I connected with  William Brewer on 03/27/23 by a audio enabled telemedicine application and verified that I am speaking with the correct person using two identifiers.  Patient Location: Home  Provider Location: Office/Clinic  I discussed the limitations of evaluation and management by telemedicine. The patient expressed understanding and agreed to proceed.   Subjective:   William Brewer is a 77 y.o. male who presents for Medicare Annual/Subsequent preventive examination.  Review of Systems     Cardiac Risk Factors include: advanced age (>82men, >18 women);male gender;dyslipidemia;diabetes mellitus;hypertension     Objective:    Today's Vitals   03/27/23 1536  Weight: 185 lb (83.9 kg)   Body mass index is 25.09 kg/m.     03/27/2023    3:44 PM 03/17/2022   11:37 AM 02/25/2021    1:59 PM 06/23/2020    3:56 PM 01/15/2020    2:53 PM 01/08/2019    3:22 PM 03/26/2018    6:21 AM  Advanced Directives  Does Patient Have a Medical Advance Directive? Yes Yes No No No No No  Type of Paramedic of [place];Living will [place] in Chart? No - copy requested No - copy requested       Would patient like information on creating a medical advance directive?   No - Patient declined  Yes (MAU/Ambulatory/Procedural Areas - Information given) Yes (MAU/Ambulatory/Procedural Areas - Information given) No - Patient declined    Current Medications (verified) Outpatient Encounter Medications as of 03/27/2023  Medication Sig   ABRYSVO 120 MCG/0.5ML injection    aspirin EC 81 MG tablet Take by mouth daily.   empagliflozin (JARDIANCE) 25 MG TABS tablet Take 1 tablet (25 mg total) by mouth daily before breakfast.   lisinopril (ZESTRIL) 20 MG tablet Take 1 tablet by mouth once daily   Lysine HCl 1000 MG TABS Take 1,000 mg by mouth daily.   Magnesium 400 MG TABS Take 250 mg by mouth daily.   metFORMIN (GLUCOPHAGE) 1000 MG  tablet Take 1 tablet (1,000 mg total) by mouth 2 (two) times daily with a meal.   pregabalin (LYRICA) 75 MG capsule Take 1 capsule (75 mg total) by mouth 3 (three) times daily.   rosuvastatin (CRESTOR) 10 MG tablet Take 1 tablet by mouth once daily   valACYclovir (VALTREX) 1000 MG tablet TAKE TWO TABLETS BY MOUTH TWICE DAILY FOR 1 DAY AS NEEDED   zolpidem (AMBIEN) 5 MG tablet Take 1 tablet (5 mg total) by mouth at bedtime as needed. for sleep   [DISCONTINUED] Semaglutide (RYBELSUS) 3 MG TABS Take 3 mg by mouth daily. 1st 30 days only- start with 3 mg dose.   [DISCONTINUED] Semaglutide (RYBELSUS) 7 MG TABS Take 7 mg by mouth daily. Start after 30 days of 3 mg dose then continue. (Patient not taking: Reported on 02/06/2023)   No facility-administered encounter medications on file as of 03/27/2023.    Allergies (verified) Patient has no known allergies.   History: Past Medical History:  Diagnosis Date   Allergy    Basal cell carcinoma    leg   Colon polyps    Diabetes mellitus without complication    DIVERTICULOSIS, COLON 05/26/2008   Hyperlipidemia    refused treatment   Hypertension    Peripheral neuropathy    Past Surgical History:  Procedure Laterality Date   APPENDECTOMY     COLONOSCOPY     LEFT HEART  CATH AND CORONARY ANGIOGRAPHY N/A 03/26/2018   Procedure: LEFT HEART CATH AND CORONARY ANGIOGRAPHY;  Surgeon: Jettie Booze, MD;  Location: [facility];  Service: Cardiovascular;  Laterality: N/A;   POLYPECTOMY     ULTRASOUND GUIDANCE FOR VASCULAR ACCESS  03/26/2018   Procedure: Ultrasound Guidance For Vascular Access;  Surgeon: Jettie Booze, MD;  Location: [facility];  Service: Cardiovascular;;   Family History  Adopted: Yes  Problem Relation Age of Onset   Deep vein thrombosis Mother        cancer   Cancer Mother        intestine   Heart disease Father 51       CABG, former smoker but not heavy   Alzheimer's disease Maternal Grandmother    Aortic  aneurysm Maternal Grandfather    Breast cancer Daughter    Hypertension Neg Hx    Colon cancer Neg Hx    Social History   Socioeconomic History   Marital status: Married    Spouse name: Not on file   Number of children: Not on file   Years of education: Not on file   Highest education level: Not on file  Occupational History   Not on file  Tobacco Use   Smoking status: Never   Smokeless tobacco: Never  Vaping Use   Vaping Use: Never used  Substance and Sexual Activity   Alcohol use: Yes    Alcohol/week: 5.0 - 6.0 standard drinks of alcohol    Types: 3 Cans of beer, 2 - 3 Standard drinks or equivalent per week    Comment: 3 a week    Drug use: No   Sexual activity: Yes  Other Topics Concern   Not on file  Social History Narrative   Married 1973. 2 children. 2 grandkids. Son in [place]. Daughter 5 minutes away.       Retired Worked form Administrator, arts: support group for peripheral neuropathy, chess, words with friends, active with [employer] college (board of advisors)   Social Determinants of Health   Financial Resource Strain: [place]  (03/27/2023)   Overall Financial Resource Strain (CARDIA)    Difficulty of Paying Living Expenses: Not hard at all  Food Insecurity: No Food Insecurity (03/27/2023)   Hunger Vital Sign    Worried About Running Out of Food in the Last Year: Never true    [place] in the Last Year: Never true  Transportation Needs: No Transportation Needs (03/27/2023)   PRAPARE - Hydrologist (Medical): No    Lack of Transportation (Non-Medical): No  Physical Activity: Insufficiently Active (03/27/2023)   Exercise Vital Sign    Days of Exercise per Week: 2 days    Minutes of Exercise per Session: 50 min  Stress: No Stress Concern Present (03/27/2023)   [place]    Feeling of Stress : Not at all  Social  Connections: Moderately Isolated (03/27/2023)   Social Connection and Isolation Panel [NHANES]    Frequency of Communication with Friends and Family: More than three times a week    Frequency of Social Gatherings with Friends and Family: More than three times a week    Attends Religious Services: Never    Marine scientist or Organizations: No    Attends Archivist Meetings: Never    Marital Status: Married    Tobacco  Counseling Counseling given: Not Answered   Clinical Intake:  Pre-visit preparation completed: Yes  Pain : No/denies pain     BMI - recorded: 25.09 Nutritional Status: BMI 25 -29 Overweight Nutritional Risks: None Diabetes: Yes CBG done?: No Did pt. bring in CBG monitor from home?: No  How often do you need to have someone help you when you read instructions, pamphlets, or other written materials from your doctor or pharmacy?: 1 - Never  Diabetic?Nutrition Risk Assessment:  Has the patient had any N/V/D within the last 2 months?  No  Does the patient have any non-healing wounds?  No  Has the patient had any unintentional weight loss or weight gain?  No   Diabetes:  Is the patient diabetic?  Yes  If diabetic, was a CBG obtained today?  No  Did the patient bring in their glucometer from home?  No  How often do you monitor your CBG's? N/a.   Financial Strains and Diabetes Management:  Are you having any financial strains with the device, your supplies or your medication? No .  Does the patient want to be seen by Chronic Care Management for management of their diabetes?  No  Would the patient like to be referred to a Nutritionist or for Diabetic Management?  No   Diabetic Exams:  Diabetic Eye Exam: Completed 08/07/22 Diabetic Foot Exam: Overdue, Pt has been advised about the importance in completing this exam. Pt is scheduled for diabetic foot exam on next .   Interpreter Needed?: No  Information entered by :: Charlott Rakes,  LPN   Activities of Daily Living    03/27/2023    3:46 PM  In your present state of health, do you have any difficulty performing the following activities:  Hearing? 0  Vision? 0  Difficulty concentrating or making decisions? 0  Walking or climbing stairs? 1  Comment of course w a railing  Dressing or bathing? 0  Doing errands, shopping? 0  Preparing Food and eating ? N  Using the Toilet? N  In the past six months, have you accidently leaked urine? N  Do you have problems with loss of bowel control? N  Managing your Medications? N  Managing your Finances? N  Housekeeping or managing your Housekeeping? N    Patient Care Team: Marin Olp, MD as PCP - General (Family Medicine) Dorothy Spark, MD as PCP - Cardiology (Cardiology) Juluis Rainier as Consulting Physician (Optometry) Regal, Tamala Fothergill, DPM as Consulting Physician (Podiatry) Edythe Clarity, [facility] (Pharmacist)  Indicate any recent Medical Services you may have received from other than [facility] providers in the past year (date may be approximate).     Assessment:   This is a routine wellness examination for William Brewer.  Hearing/Vision screen Hearing Screening - Comments:: Pt denies any hearing issues  Vision Screening - Comments:: Pt follows up with Dr Joya San at [facility] for annual eye exams   Dietary issues and exercise activities discussed: Current Exercise Habits: Structured exercise class, Type of exercise: Other - see comments, Time (Minutes): 50, Frequency (Times/Week): 2, Weekly Exercise (Minutes/Week): 100   Goals Addressed             This Visit's Progress    Patient Stated       None at this time        Depression Screen    03/27/2023    3:43 PM 03/17/2022   11:36 AM 02/02/2022    2:37 PM 02/25/2021  1:57 PM 07/20/2020   11:22 AM 06/25/2020    9:35 AM 03/02/2020    8:43 AM  PHQ 2/9 Scores  PHQ - 2 Score 0 0 0 0 0 0 0  PHQ- 9 Score   0  0 0 0    Fall Risk    03/27/2023    3:46 PM  03/17/2022   11:39 AM 02/25/2021    2:00 PM 01/15/2020    2:53 PM 01/08/2019    3:33 PM  Fall Risk   Falls in the past year? 0 0 0 0 0  Number falls in past yr: 0 0 0 0   Injury with Fall? 0 0 0 0   Risk for fall due to : Impaired vision;Impaired balance/gait Impaired vision;Impaired balance/gait Impaired vision;Impaired balance/gait    Follow up Falls prevention discussed Falls prevention discussed Falls prevention discussed Falls evaluation completed;Education provided;Falls prevention discussed     FALL RISK PREVENTION PERTAINING TO THE HOME:  Any stairs in or around the home? Yes  If so, are there any without handrails? No  Home free of loose throw rugs in walkways, pet beds, electrical cords, etc? Yes  Adequate lighting in your home to reduce risk of falls? Yes   ASSISTIVE DEVICES UTILIZED TO PREVENT FALLS:  Life alert? Apple watch  Use of a cane, walker or w/c? No  Grab bars in the bathroom? Yes  Shower chair or bench in shower? No  Elevated toilet seat or a handicapped toilet? No   TIMED UP AND GO:  Was the test performed? No .   Cognitive Function:    01/08/2019    3:44 PM  MMSE - Mini Mental State Exam  Orientation to time 5  Orientation to Place 5  Registration 3  Attention/ Calculation 5  Recall 3  Language- name 2 objects 2  Language- repeat 1  Language- follow 3 step command 3  Language- read & follow direction 1  Write a sentence 1  Copy design 1  Total score 30        03/27/2023    3:48 PM 03/17/2022   11:43 AM 02/25/2021    2:03 PM 01/15/2020    3:04 PM  6CIT Screen  What Year? 0 points 0 points 0 points 0 points  What month? 0 points 0 points 0 points 0 points  What time? 0 points 0 points  0 points  Count back from 20 0 points 0 points 0 points 0 points  Months in reverse 0 points 0 points 0 points 0 points  Repeat phrase 0 points 0 points 0 points 0 points  Total Score 0 points 0 points  0 points    Immunizations Immunization History   Administered Date(s) Administered   Fluad Quad(high Dose 65+) 10/16/2019, 12/02/2020   Influenza, High Dose Seasonal PF 10/24/2016, 02/12/2018, 11/08/2018, 10/08/2021, 08/28/2022   Influenza,inj,Quad PF,6+ Mos 09/23/2013, 11/27/2014, 10/28/2015   PFIZER Comirnaty(Gray Top)Covid-19 Tri-Sucrose Vaccine 09/27/2022   PFIZER(Purple Top)SARS-COV-2 Vaccination 01/30/2020, 02/24/2020, 10/02/2020, 05/31/2021   PNEUMOCOCCAL CONJUGATE-20 09/29/2021   Pfizer Covid-19 Vaccine Bivalent Booster 56yrs & up 10/31/2021   Pneumococcal Conjugate-13 04/29/2015   Pneumococcal Polysaccharide-23 09/20/2012   Td 05/26/2008   Tdap 05/26/2008, 01/17/2020   Zoster Recombinat (Shingrix) 06/06/2022   Zoster, Live 09/20/2012    TDAP status: Up to date  Flu Vaccine status: Up to date  Pneumococcal vaccine status: Up to date  Covid-19 vaccine status: Completed vaccines  Qualifies for Shingles Vaccine? Yes   Zostavax completed No  Shingrix Completed?: No.    Education has been provided regarding the importance of this vaccine. Patient has been advised to call insurance company to determine out of pocket expense if they have not yet received this vaccine. Advised may also receive vaccine at local pharmacy or Health Dept. Verbalized acceptance and understanding.  Screening Tests Health Maintenance  Topic Date Due   Zoster Vaccines- Shingrix (2 of 2) 08/01/2022   COVID-19 Vaccine (7 - 2023-24 season) 11/22/2022   FOOT EXAM  02/02/2023   HEMOGLOBIN A1C  04/06/2023   INFLUENZA VACCINE  07/26/2023   OPHTHALMOLOGY EXAM  08/08/2023   Diabetic kidney evaluation - eGFR measurement  10/06/2023   Diabetic kidney evaluation - Urine ACR  10/06/2023   Medicare Annual Wellness (AWV)  03/26/2024   DTaP/Tdap/Td (4 - Td or Tdap) 01/16/2030   Pneumonia Vaccine 38+ Years old  Completed   Hepatitis C Screening  Completed   HPV VACCINES  Aged Out   COLONOSCOPY (Pts 45-106yrs Insurance coverage will need to be confirmed)   Discontinued    Health Maintenance  Health Maintenance Due  Topic Date Due   Zoster Vaccines- Shingrix (2 of 2) 08/01/2022   COVID-19 Vaccine (7 - 2023-24 season) 11/22/2022   FOOT EXAM  02/02/2023    Colorectal cancer screening: No longer required.    Additional Screening:  Hepatitis C Screening:  Completed 01/27/09  Vision Screening: Recommended annual ophthalmology exams for early detection of glaucoma and other disorders of the eye. Is the patient up to date with their annual eye exam?  Yes  Who is the provider or what is the name of the office in which the patient attends annual eye exams? Dr Joya San  If pt is not established with a provider, would they like to be referred to a provider to establish care? No .   Dental Screening: Recommended annual dental exams for proper oral hygiene  Community Resource Referral / Chronic Care Management: CRR required this visit?  No   CCM required this visit?  No      Plan:     I have personally reviewed and noted the following in the patient's chart:   Medical and social history Use of alcohol, tobacco or illicit drugs  Current medications and supplements including opioid prescriptions. Patient is not currently taking opioid prescriptions. Functional ability and status Nutritional status Physical activity Advanced directives List of other physicians Hospitalizations, surgeries, and ER visits in previous 12 months Vitals Screenings to include cognitive, depression, and falls Referrals and appointments  In addition, I have reviewed and discussed with patient certain preventive protocols, quality metrics, and best practice recommendations. A written personalized care plan for preventive services as well as general preventive health recommendations were provided to patient.     Willette Brace, LPN   [id number]   Nurse Notes: none

## 2023-03-28 ENCOUNTER — Other Ambulatory Visit: Payer: Self-pay | Admitting: Family Medicine

## 2023-04-04 ENCOUNTER — Encounter: Payer: Self-pay | Admitting: Family

## 2023-04-04 ENCOUNTER — Ambulatory Visit (INDEPENDENT_AMBULATORY_CARE_PROVIDER_SITE_OTHER): Payer: Medicare Other | Admitting: Family

## 2023-04-04 VITALS — BP 126/72 | HR 68 | Temp 96.9°F | Ht 72.0 in | Wt 180.0 lb

## 2023-04-04 DIAGNOSIS — U071 COVID-19: Secondary | ICD-10-CM

## 2023-04-04 LAB — POCT INFLUENZA A/B
Influenza A, POC: NEGATIVE
Influenza B, POC: NEGATIVE

## 2023-04-04 LAB — POC COVID19 BINAXNOW: SARS Coronavirus 2 Ag: POSITIVE — AB

## 2023-04-04 LAB — POCT RAPID STREP A (OFFICE): Rapid Strep A Screen: NEGATIVE

## 2023-04-04 MED ORDER — PREDNISONE 20 MG PO TABS
ORAL_TABLET | ORAL | 0 refills | Status: DC
Start: 2023-04-04 — End: 2024-02-26

## 2023-04-04 NOTE — Progress Notes (Signed)
Patient ID: William Brewer, male    DOB: Dec 16, 1946, 77 y.o.   MRN: [id number]  Chief Complaint  Patient presents with   Sinus Problem    Pt c/o Right ear pain , Sore throat, Nasal congestion and cough with clear mucus. Present 6 days. Has tried robitussin dm, cough drops and chloraseptic spray which does help temporarily.     HPI:      URI sx:  Pt c/o Right ear pain, severe sore throat, hoarseness, pt can't speak & hard time swallowing for a week. Nasal congestion and cough with clear mucus. Has tried robitussin dm, cough drops and chloraseptic spray which does help temporarily. taking Delsym and Robitussin syrups for cough, and has tried Dayquil.  Assessment & Plan:  1. COVID-19 - rapid strep & flu neg. Pt w/sx almost a week, advised Paxlovid most likely ineffective. Sending low dose pred, advised on use & SE. Advised to continue the cough syrups, take Dayquil to help with sinus sx, can also add a generic Claritin or zyrtec qd. Drink at least 2L of water qd, warm salt water gargles tid prn.  - POC COVID-19 - POCT rapid strep A - POCT Influenza A/B - predniSONE (DELTASONE) 20 MG tablet; Take 2 pills in the morning with breakfast for 3 days, then 1 pill for 2 days  Dispense: 8 tablet; Refill: 0    Outpatient Medications Prior to Visit  Medication Sig Dispense Refill   ABRYSVO 120 MCG/0.5ML injection      aspirin EC 81 MG tablet Take by mouth daily.     empagliflozin (JARDIANCE) 25 MG TABS tablet Take 1 tablet (25 mg total) by mouth daily before breakfast. 30 tablet 11   lisinopril (ZESTRIL) 20 MG tablet Take 1 tablet by mouth once daily 90 tablet 0   Lysine HCl 1000 MG TABS Take 1,000 mg by mouth daily.     Magnesium 400 MG TABS Take 250 mg by mouth daily.     metFORMIN (GLUCOPHAGE) 1000 MG tablet Take 1 tablet (1,000 mg total) by mouth 2 (two) times daily with a meal. 180 tablet 3   pregabalin (LYRICA) 75 MG capsule Take 1 capsule (75 mg total) by mouth 3 (three) times daily. 90  capsule 5   rosuvastatin (CRESTOR) 10 MG tablet Take 1 tablet by mouth once daily 90 tablet 0   valACYclovir (VALTREX) 1000 MG tablet TAKE TWO TABLETS BY MOUTH TWICE DAILY FOR 1 DAY AS NEEDED 20 tablet 3   zolpidem (AMBIEN) 5 MG tablet Take 1 tablet (5 mg total) by mouth at bedtime as needed. for sleep 30 tablet 5   No facility-administered medications prior to visit.   Past Medical History:  Diagnosis Date   Allergy    Basal cell carcinoma    leg   Colon polyps    Diabetes mellitus without complication    DIVERTICULOSIS, COLON 05/26/2008   Hyperlipidemia    refused treatment   Hypertension    Peripheral neuropathy    Past Surgical History:  Procedure Laterality Date   APPENDECTOMY     COLONOSCOPY     LEFT HEART CATH AND CORONARY ANGIOGRAPHY N/A 03/26/2018   Procedure: LEFT HEART CATH AND CORONARY ANGIOGRAPHY;  Surgeon: Corky Crafts, MD;  Location: [facility];  Service: Cardiovascular;  Laterality: N/A;   POLYPECTOMY     ULTRASOUND GUIDANCE FOR VASCULAR ACCESS  03/26/2018   Procedure: Ultrasound Guidance For Vascular Access;  Surgeon: Corky Crafts, MD;  Location: [facility]  [facility];  Service: Cardiovascular;;   No Known Allergies    Objective:    Physical Exam Vitals and nursing note reviewed.  Constitutional:      General: He is not in acute distress.    Appearance: Normal appearance.  HENT:     Head: Normocephalic.     Right Ear: Tympanic membrane and ear canal normal.     Left Ear: Tympanic membrane and ear canal normal.     Nose:     Right Sinus: Frontal sinus tenderness present. No maxillary sinus tenderness.     Left Sinus: Frontal sinus tenderness present. No maxillary sinus tenderness.     Mouth/Throat:     Mouth: Mucous membranes are moist.     Pharynx: Oropharyngeal exudate and posterior oropharyngeal erythema present. No pharyngeal swelling or uvula swelling.     Tonsils: Tonsillar exudate present. No tonsillar abscesses.  Cardiovascular:      Rate and Rhythm: Normal rate and regular rhythm.  Pulmonary:     Effort: Pulmonary effort is normal.     Breath sounds: Normal breath sounds.  Musculoskeletal:        General: Normal range of motion.     Cervical back: Normal range of motion.  Lymphadenopathy:     Head:     Right side of head: No preauricular or posterior auricular adenopathy.     Left side of head: No preauricular or posterior auricular adenopathy.     Cervical: No cervical adenopathy.  Skin:    General: Skin is warm and dry.  Neurological:     Mental Status: He is alert and oriented to person, place, and time.  Psychiatric:        Mood and Affect: Mood normal.    BP 126/72 (BP Location: Left Arm, Patient Position: Sitting, Cuff Size: Normal)   Pulse 68   Temp (!) 96.9 F (36.1 C) (Temporal)   Ht 6' (1.829 m)   Wt 180 lb (81.6 kg)   SpO2 96%   BMI 24.41 kg/m  Wt Readings from Last 3 Encounters:  04/04/23 180 lb (81.6 kg)  03/27/23 185 lb (83.9 kg)  11/01/22 185 lb (83.9 kg)       Dulce Sellar, NP

## 2023-04-23 ENCOUNTER — Other Ambulatory Visit: Payer: Self-pay | Admitting: Family Medicine

## 2023-04-26 ENCOUNTER — Other Ambulatory Visit: Payer: Self-pay | Admitting: Family Medicine

## 2023-05-01 ENCOUNTER — Ambulatory Visit (INDEPENDENT_AMBULATORY_CARE_PROVIDER_SITE_OTHER): Payer: Medicare Other | Admitting: Family Medicine

## 2023-05-01 ENCOUNTER — Encounter: Payer: Self-pay | Admitting: Family Medicine

## 2023-05-01 VITALS — BP 122/78 | HR 76 | Temp 98.4°F | Resp 16 | Ht 72.0 in | Wt 177.4 lb

## 2023-05-01 DIAGNOSIS — E785 Hyperlipidemia, unspecified: Secondary | ICD-10-CM | POA: Diagnosis not present

## 2023-05-01 DIAGNOSIS — E1142 Type 2 diabetes mellitus with diabetic polyneuropathy: Secondary | ICD-10-CM

## 2023-05-01 DIAGNOSIS — I2510 Atherosclerotic heart disease of native coronary artery without angina pectoris: Secondary | ICD-10-CM

## 2023-05-01 DIAGNOSIS — G609 Hereditary and idiopathic neuropathy, unspecified: Secondary | ICD-10-CM | POA: Diagnosis not present

## 2023-05-01 DIAGNOSIS — E1169 Type 2 diabetes mellitus with other specified complication: Secondary | ICD-10-CM

## 2023-05-01 DIAGNOSIS — Z7984 Long term (current) use of oral hypoglycemic drugs: Secondary | ICD-10-CM

## 2023-05-01 DIAGNOSIS — I10 Essential (primary) hypertension: Secondary | ICD-10-CM

## 2023-05-01 DIAGNOSIS — I700 Atherosclerosis of aorta: Secondary | ICD-10-CM

## 2023-05-01 NOTE — Patient Instructions (Addendum)
Team please request his last shingrix- reports had both at pharmacy. Also appears had RSV vaccine- listed in his medication(s)- please add to immunizations  Please stop by lab before you go If you have mychart- we will send your results within 3 business days of [place] receiving them.  If you do not have mychart- we will call you about results within 5 business days of [place] receiving them.  *please also note that you will see labs on mychart as soon as they post. I will later go in and write notes on them- will say "notes from Dr. Durene Cal"   Recommended follow up: Return in about 4 months (around 09/01/2023) for physical or sooner if needed.Schedule b4 you leave.

## 2023-05-01 NOTE — Progress Notes (Signed)
Phone [phone] In person visit   Subjective:   William Brewer is a 77 y.o. year old very pleasant male patient who presents for/with See problem oriented charting Chief Complaint  Patient presents with   Diabetes   Hyperlipidemia   Hypertension   Insomnia    Past Medical History-  Patient Active Problem List   Diagnosis Date Noted   CAD (coronary artery disease) 11/08/2018    Priority: High   Diabetes mellitus type II, controlled (HCC) 09/23/2013    Priority: High   Idiopathic peripheral neuropathy 01/27/2009    Priority: High   Aortic atherosclerosis (HCC) 06/25/2020    Priority: Medium    Insomnia 11/08/2018    Priority: Medium    Right groin hernia 10/24/2016    Priority: Medium    Essential hypertension 03/26/2008    Priority: Medium    Hyperlipidemia associated with type 2 diabetes mellitus (HCC) 06/21/2007    Priority: Medium    Basal cell carcinoma of skin 10/28/2015    Priority: Low   Fever blister 08/10/2015    Priority: Low   History of colonic polyps 06/21/2007    Priority: Low    Medications- reviewed and updated Current Outpatient Medications  Medication Sig Dispense Refill   ABRYSVO 120 MCG/0.5ML injection      aspirin EC 81 MG tablet Take by mouth daily.     empagliflozin (JARDIANCE) 25 MG TABS tablet Take 1 tablet (25 mg total) by mouth daily before breakfast. 30 tablet 11   lisinopril (ZESTRIL) 20 MG tablet Take 1 tablet by mouth once daily 90 tablet 0   Lysine HCl 1000 MG TABS Take 1,000 mg by mouth daily.     Magnesium 400 MG TABS Take 250 mg by mouth daily.     metFORMIN (GLUCOPHAGE) 1000 MG tablet Take 1 tablet (1,000 mg total) by mouth 2 (two) times daily with a meal. 180 tablet 3   predniSONE (DELTASONE) 20 MG tablet Take 2 pills in the morning with breakfast for 3 days, then 1 pill for 2 days 8 tablet 0   pregabalin (LYRICA) 75 MG capsule Take 1 capsule (75 mg total) by mouth 3 (three) times daily. 90 capsule 5   rosuvastatin  (CRESTOR) 10 MG tablet Take 1 tablet by mouth once daily 90 tablet 0   valACYclovir (VALTREX) 1000 MG tablet TAKE 2 TABLETS BY MOUTH TWICE DAILY AS NEEDED FOR 1 DAY 20 tablet 0   zolpidem (AMBIEN) 5 MG tablet TAKE 1 TABLET BY MOUTH AT BEDTIME AS NEEDED FOR SLEEP 30 tablet 0   No current facility-administered medications for this visit.     Objective:  BP 122/78   Pulse 76   Temp 98.4 F (36.9 C) (Temporal)   Resp 16   Ht 6' (1.829 m)   Wt 177 lb 6.4 oz (80.5 kg)   SpO2 93%   BMI 24.06 kg/m  Gen: NAD, resting comfortably CV: RRR no murmurs rubs or gallops Lungs: CTAB no crackles, wheeze, rhonchi Ext: no edema Skin: warm, dry     Assessment and Plan   #COVID in April- bad sore throat and never regained voice completely but has gradually improved. Will monitor- no throat pain  #CAD-nonobstructive based off CAD in 2019 #hyperlipidemia #Aortic atherosclerosis S: Medication: Rosuvastatin 10Mg , aspirin 81 mg -no chest pain or shortness of breath  Lab Results  Component Value Date   CHOL 114 06/02/2022   HDL 37 (L) 06/02/2022   LDLCALC 46 06/02/2022   LDLDIRECT 72.0 02/02/2022  TRIG 267 (H) 06/02/2022   CHOLHDL 3.1 06/02/2022   A/P: CAD nonobstructive - continue current medications   Aortic atherosclerosis (presumed stable)- LDL goal ideally <70 -  at goal For lipids - stable- continue current medicines - could work on diet to get triglycerides down though and better sugar control will help   # Diabetes S: Medication: Metformin 1000Mg  twice daily, Jardiance 25Mg . Rybelsus costly.  -Prefers to avoid injection -Last visit was doing an exercise 12-minute walk in the day plus twice a week chair Yoga- now 4-5 days a week walking and yoga more hit or miss CBGs- does not check Exercise and diet-  weight down 7 lbs reports though not eating well -discussed sugar could  be worse- he does intermittent fasting Lab Results  Component Value Date   HGBA1C 8.4 (H) 10/05/2022    HGBA1C 7.9 (H) 06/02/2022   HGBA1C 8.1 (H) 02/02/2022  A/P: a1c possibly poorly controlled- update a1c with labs- wants to hold off on rybelsus due to cost.  -holding off on foot exam until next visit- working with podiatry  #hypertension S: medication: Lisinopril 20Mg .   Home readings #s: not checking BP Readings from Last 3 Encounters:  05/01/23 122/78  04/04/23 126/72  11/01/22 112/69  A/P: stable- continue current medicines   #Idiopathic neuropathy predating diabetes S: Medication: Lyrica 75 mg 3 times a day  Today patient reports ongoing issues- perhaps gradually worsening but is tolerating higher dose  A/P: reasonable stable- continue current medications   - has cane on hand now  Recommended follow up: Return in about 4 months (around 09/01/2023) for physical or sooner if needed.Schedule b4 you leave. Future Appointments  Date Time Provider Department Center  05/07/2023  2:00 PM Erroll Luna, [place] [facility] None  05/31/2023 11:15 AM Edwin Cap, DPM [facility]  04/01/2024  3:30 PM [facility] ANNUAL WELLNESS VISIT 1 [facility]   Lab/Order associations:   ICD-10-CM   1. Idiopathic peripheral neuropathy  G60.9     2. Controlled type 2 diabetes mellitus with diabetic polyneuropathy, without long-term current use of insulin (HCC)  E11.42 Comprehensive metabolic panel    CBC with Differential/Platelet    Hemoglobin A1c    3. Aortic atherosclerosis (HCC)  I70.0     4. Coronary artery disease involving native coronary artery of native heart without angina pectoris  I25.10     5. Essential hypertension  I10     6. Hyperlipidemia associated with type 2 diabetes mellitus (HCC)  E11.69    E78.5       No orders of the defined types were placed in this encounter.   Return precautions advised.  Tana Conch, MD

## 2023-05-02 LAB — CBC WITH DIFFERENTIAL/PLATELET
Basophils Absolute: 0 10*3/uL (ref 0.0–0.1)
Basophils Relative: 0.6 % (ref 0.0–3.0)
Eosinophils Absolute: 0.2 10*3/uL (ref 0.0–0.7)
Eosinophils Relative: 2.7 % (ref 0.0–5.0)
HCT: 47.6 % (ref 39.0–52.0)
Hemoglobin: 16.2 g/dL (ref 13.0–17.0)
Lymphocytes Relative: 28.3 % (ref 12.0–46.0)
Lymphs Abs: 1.7 10*3/uL (ref 0.7–4.0)
MCHC: 34.1 g/dL (ref 30.0–36.0)
MCV: 92.6 fl (ref 78.0–100.0)
Monocytes Absolute: 0.5 10*3/uL (ref 0.1–1.0)
Monocytes Relative: 8.8 % (ref 3.0–12.0)
Neutro Abs: 3.6 10*3/uL (ref 1.4–7.7)
Neutrophils Relative %: 59.6 % (ref 43.0–77.0)
Platelets: 167 10*3/uL (ref 150.0–400.0)
RBC: 5.14 Mil/uL (ref 4.22–5.81)
RDW: 14.1 % (ref 11.5–15.5)
WBC: 6 10*3/uL (ref 4.0–10.5)

## 2023-05-02 LAB — COMPREHENSIVE METABOLIC PANEL
ALT: 31 U/L (ref 0–53)
AST: 29 U/L (ref 0–37)
Albumin: 4.7 g/dL (ref 3.5–5.2)
Alkaline Phosphatase: 55 U/L (ref 39–117)
BUN: 19 mg/dL (ref 6–23)
CO2: 26 mEq/L (ref 19–32)
Calcium: 9.8 mg/dL (ref 8.4–10.5)
Chloride: 102 mEq/L (ref 96–112)
Creatinine, Ser: 0.97 mg/dL (ref 0.40–1.50)
GFR: 75.78 mL/min (ref >60.00)
Glucose, Bld: 122 mg/dL — ABNORMAL HIGH (ref 70–99)
Potassium: 4.2 mEq/L (ref 3.5–5.1)
Sodium: 140 mEq/L (ref 135–145)
Total Bilirubin: 0.7 mg/dL (ref 0.2–1.2)
Total Protein: 7.4 g/dL (ref 6.0–8.3)

## 2023-05-02 LAB — HEMOGLOBIN A1C: Hgb A1c MFr Bld: 7.4 % — ABNORMAL HIGH (ref 4.6–6.5)

## 2023-05-03 ENCOUNTER — Ambulatory Visit: Payer: Medicare Other | Admitting: Family Medicine

## 2023-05-04 ENCOUNTER — Telehealth: Payer: Self-pay | Admitting: Pharmacist

## 2023-05-04 NOTE — Progress Notes (Signed)
Care Management & Coordination Services Pharmacy Team  Reason for Encounter: Appointment Reminder  Contacted patient to confirm in office appointment with Erskine Emery, PharmD on 05/07/2023 at 2 pm. Spoke with patient on 05/04/2023    Star Rating Drugs:  Jardiance 25 mg last filled 04/11/2023 30 DS   Care Gaps: Annual wellness visit in last year? Yes  Future Appointments  Date Time Provider Department Center  05/07/2023  2:00 PM Erroll Luna, [place] [facility] None  05/31/2023 11:15 AM Edwin Cap, DPM [facility]  10/30/2023  3:00 PM Shelva Majestic, MD [facility]  04/01/2024  3:30 PM [facility] ANNUAL WELLNESS VISIT 1 [facility]

## 2023-05-07 ENCOUNTER — Ambulatory Visit: Payer: Medicare Other | Admitting: Pharmacist

## 2023-05-07 NOTE — Progress Notes (Signed)
Care Management & Coordination Services Pharmacy Note  05/07/2023 Name:  AIRON Brewer MRN:  [id number] DOB:  [birth year]  Summary: Pharmd FU visit.  A1c improved since our last conversation and increased Jardiance dose.  Still above goal < 7 at 7.4%.  Considered Rybelsus at appt with PCP held off due to cost.  Discussed other ways to work towards goal < 7.  Recommendations/Changes made from today's visit: Today, sent him assistance application for Jardiance which may allow him to afoord Rybelsus.  Could also assist with Rybelsus application if approved for Jardiance.  Follow up plan: FU 3-4 weeks on Jardiance PAP   Subjective: William Brewer is an 77 y.o. year old male who is a primary patient of Durene Cal, Aldine Contes, MD.  The care coordination team was consulted for assistance with disease management and care coordination needs.    Engaged with patient by telephone for follow up visit.  Recent office visits:  04/28/23 Durene Cal) - A1c improved, considered addition of Rybelsus but opted not to due to cost barriers   Recent consult visits:  None since last CPP visit   Hospital visits:  None in previous 6 months   Objective:  Lab Results  Component Value Date   CREATININE 0.97 05/01/2023   BUN 19 05/01/2023   GFR 75.78 05/01/2023   GFRNONAA 77 12/02/2020   GFRAA 89 12/02/2020   NA 140 05/01/2023   K 4.2 05/01/2023   CALCIUM 9.8 05/01/2023   CO2 26 05/01/2023   GLUCOSE 122 (H) 05/01/2023    Lab Results  Component Value Date/Time   HGBA1C 7.4 (H) 05/01/2023 03:18 PM   HGBA1C 8.4 (H) 10/05/2022 01:29 PM   GFR 75.78 05/01/2023 03:18 PM   GFR 66.14 10/05/2022 01:29 PM   MICROALBUR <0.7 10/05/2022 01:29 PM    Last diabetic Eye exam:  Lab Results  Component Value Date/Time   HMDIABEYEEXA No Retinopathy 08/07/2022 12:00 AM    Last diabetic Foot exam: No results found for: "HMDIABFOOTEX"   Lab Results  Component Value Date   CHOL 114 06/02/2022   HDL 37 (L)  06/02/2022   LDLCALC 46 06/02/2022   LDLDIRECT 72.0 02/02/2022   TRIG 267 (H) 06/02/2022   CHOLHDL 3.1 06/02/2022       Latest Ref Rng & Units 05/01/2023    3:18 PM 10/05/2022    1:29 PM 06/02/2022    4:46 PM  Hepatic Function  Total Protein 6.0 - 8.3 g/dL 7.4  7.3  7.3   Albumin 3.5 - 5.2 g/dL 4.7  4.5    AST 0 - 37 U/L 29  35  17   ALT 0 - 53 U/L 31  41  23   Alk Phosphatase 39 - 117 U/L 55  50    Total Bilirubin 0.2 - 1.2 mg/dL 0.7  0.7  0.4     Lab Results  Component Value Date/Time   TSH 3.89 02/12/2018 08:45 AM   TSH 2.13 11/30/2014 10:03 AM       Latest Ref Rng & Units 05/01/2023    3:18 PM 02/02/2022    3:53 PM 03/31/2021    1:29 PM  CBC  WBC 4.0 - 10.5 K/uL 6.0  7.1  5.7   Hemoglobin 13.0 - 17.0 g/dL 40.9  81.1  91.4   Hematocrit 39.0 - 52.0 % 47.6  46.0  44.6   Platelets 150.0 - 400.0 K/uL 167.0  202.0  172.0     Lab Results  Component Value Date/Time  RUEAVWUJ81 239 02/02/2022 03:53 PM   VITAMINB12 412 07/20/2020 12:06 PM    Clinical ASCVD: No  The ASCVD Risk score (Arnett DK, et al., 2019) failed to calculate for the following reasons:   The valid total cholesterol range is 130 to 320 mg/dL        01/02/1477    2:95 PM 03/17/2022   11:36 AM 02/02/2022    2:37 PM  Depression screen PHQ 2/9  Decreased Interest 0 0 0  Down, Depressed, Hopeless 0 0 0  PHQ - 2 Score 0 0 0  Altered sleeping   0  Tired, decreased energy   0  Change in appetite   0  Feeling bad or failure about yourself    0  Trouble concentrating   0  Moving slowly or fidgety/restless   0  Suicidal thoughts   0  PHQ-9 Score   0  Difficult doing work/chores   Not difficult at all     Social History   Tobacco Use  Smoking Status Never  Smokeless Tobacco Never   BP Readings from Last 3 Encounters:  05/01/23 122/78  04/04/23 126/72  11/01/22 112/69   Pulse Readings from Last 3 Encounters:  05/01/23 76  04/04/23 68  11/01/22 67   Wt Readings from Last 3 Encounters:  05/01/23  177 lb 6.4 oz (80.5 kg)  04/04/23 180 lb (81.6 kg)  03/27/23 185 lb (83.9 kg)   BMI Readings from Last 3 Encounters:  05/01/23 24.06 kg/m  04/04/23 24.41 kg/m  03/27/23 25.09 kg/m    No Known Allergies  Medications Reviewed Today     Reviewed by Erroll Luna, [facility] (Pharmacist) on 05/07/23 at 1441  Med List Status: <None>   Medication Order Taking? Sig Documenting Provider Last Dose Status Informant  ABRYSVO 120 MCG/0.5ML injection [id number] Yes  [provider] Taking Active   aspirin EC 81 MG tablet [id number] Yes Take by mouth daily. [provider] Taking Active   empagliflozin (JARDIANCE) 25 MG TABS tablet [id number] Yes Take 1 tablet (25 mg total) by mouth daily before breakfast. Shelva Majestic, MD Taking Active   lisinopril (ZESTRIL) 20 MG tablet [id number] Yes Take 1 tablet by mouth once daily Shelva Majestic, MD Taking Active   Lysine HCl 1000 MG TABS [id number] Yes Take 1,000 mg by mouth daily. [provider] Taking Active Self  Magnesium 400 MG TABS [id number] Yes Take 250 mg by mouth daily. [provider] Taking Active Self  metFORMIN (GLUCOPHAGE) 1000 MG tablet [id number] Yes Take 1 tablet (1,000 mg total) by mouth 2 (two) times daily with a meal. Shelva Majestic, MD Taking Active   predniSONE (DELTASONE) 20 MG tablet [id number] Yes Take 2 pills in the morning with breakfast for 3 days, then 1 pill for 2 days Dulce Sellar, NP Taking Active   pregabalin (LYRICA) 75 MG capsule [id number] Yes Take 1 capsule (75 mg total) by mouth 3 (three) times daily. Shelva Majestic, MD Taking Active   rosuvastatin (CRESTOR) 10 MG tablet [id number] Yes Take 1 tablet by mouth once daily Shelva Majestic, MD Taking Active   valACYclovir (VALTREX) 1000 MG tablet [id number] Yes TAKE 2 TABLETS BY MOUTH TWICE DAILY AS NEEDED FOR 1 DAY Shelva Majestic, MD Taking Active   zolpidem (AMBIEN) 5 MG tablet [id number] Yes TAKE 1 TABLET BY MOUTH AT BEDTIME  AS NEEDED FOR SLEEP Shelva Majestic, MD Taking Active  SDOH:  (Social Determinants of Health) assessments and interventions performed: No, done within last year Financial Resource Strain: Low Risk  (03/27/2023)   Overall Financial Resource Strain (CARDIA)    Difficulty of Paying Living Expenses: Not hard at all   Food Insecurity: No Food Insecurity (03/27/2023)   Hunger Vital Sign    Worried About Running Out of Food in the Last Year: Never true    Ran Out of Food in the Last Year: Never true    SDOH Interventions    Flowsheet Row Clinical Support from 03/27/2023 in [facility]  SDOH Interventions   Food Insecurity Interventions Intervention Not Indicated  Housing Interventions Intervention Not Indicated  Transportation Interventions Intervention Not Indicated  Utilities Interventions Intervention Not Indicated  Financial Strain Interventions Intervention Not Indicated  Physical Activity Interventions Intervention Not Indicated  Stress Interventions Intervention Not Indicated  Social Connections Interventions Intervention Not Indicated       Medication Assistance: None required.  Patient affirms current coverage meets needs.  Medication Access: Within the past 30 days, how often has patient missed a dose of medication? 0 Is a pillbox or other method used to improve adherence? No  Factors that may affect medication adherence? financial need and nonadherence to medications Are meds synced by current pharmacy? No  Are meds delivered by current pharmacy? No  Does patient experience delays in picking up medications due to transportation concerns? No   [facility] Services Reviewed: Is patient disadvantaged to use [facility]?: Yes  Current Rx insurance plan: [facility] Name and location of Current pharmacy:  [facility] [address] [place], [place] - [address] [place] [address] Phone:  [phone] Fax: [phone]  [facility] services reviewed with patient today?: Yes  Patient requests to transfer care to [facility]?: No  Reason patient declined to change pharmacies: Loyalty to other pharmacy/Patient preference  Compliance/Adherence/Medication fill history: Star Rating Drugs:  Jardiance 25 mg last filled 11/28/2022 30 DS Lisinopril 20 mg last filled 10/02/2022 90 DS Metformin 1000 mg last filled 10/18/2022 90 DS Rosuvastatin 10 mg last filled 10/02/2022 90 DS     Care Gaps: Annual wellness visit in last year? Yes   If Diabetic: Last eye exam / retinopathy screening: 08/07/2022 Last diabetic foot exam: 02/02/2022   Assessment/Plan     Hypertension (BP goal <130/80) -Controlled, not assessed -Current treatment: Lisinopril 20 mg once daily -Current home readings: none provided -Denies hypotensive/hypertensive symptoms -Educated on Daily salt intake goal < 2300 mg; Symptoms of hypotension and importance of maintaining adequate hydration; -Counseled on diet and exercise extensively Recommended to continue current medication  Hyperlipidemia: (LDL goal < 70) 05/07/23 -Controlled, most recent LDL 46 -CAD, DM, HTN, HLD -Current treatment: Rosuvastatin 10 mg once daily (03/2021 start) Appropriate, Effective, Safe, Accessible -Medications previously tried: lovastatin  -Educated on Cholesterol goals;  Benefits of statin for ASCVD risk reduction; -Recommended to continue current medication -No changes needed, continue routine screenings  Update 08/23/21 Tolerating Crestor fine. Has not had updated lipids since starting. Recommend labs to assess efficacy. No changes for now.  Update 02/27/22 Continues same dose of Crestor, LDL almost at goal < 70 at most recent labs. Work on [employer], continue routine screenings. No changes needed at this time.  Diabetes w/ Neuropathy (A1c goal <7%) 05/07/23 -Not ideally controlled. a1c 7.4% most  recently -Current medications: Metformin 1000 mg twice daily with meals Appropriate, Effective, Safe, Accessible Jardiance 25 mg once daily Appropriate, Query effective, -  Current home glucose readings fasting glucose: not testing due to neuropathy -Denies hypoglycemic/hyperglycemic symptoms -Current meal patterns: intermittent fasting. Likes sweets. -Current exercise: same see previous -Educated on A1c and blood sugar goals; -Counseled to check feet daily and get yearly eye exams - due for eye exam -Recommended to continue current medication -Improvement in A1c from recent visits.  Discussed Rybelsus start with PCP, opted not to start due to financial barriers.  Today we discussed application for Jardiance assistance.  He agreed to complete this application.  This may free up Rybelsus and could even try to qualify for Rybelsus through patient assistance as well.  Not checking glucose at home but is working on dietary mods.  Will put Jardiance application in the mail - patient to return it to office once complete for MD signature and fax.  Updated 08/23/21 No medication changes since last visit, patient is not checking sugars at home.  Diet remains the same. He has not had A1c rechecked since restarting the Jardiance. He did not qualify for PAP for Jardiance. Recommend recheck A1c within the next month or two.  Can titrate Jardiance if needed.  Update 02/27/22 Patient most recent A1c was 8.1% he had a period where he was not taking his Jardiance due to cost barrier.  He is not checking glucose at home.  He has resumed Jardiance at 10mg .  We discussed applying for [place] patient assistance - his is going to search for his medicare card and provide me with the number so that I can help him apply.  Could replace Jardiance with this if he qualifies. May need to titrate to higher dose of SGLT-2 to get patient closer to goal.  Discussed dietary changes. No changes at this time - apply for Farxiga  PAP.  Insomnia (Goal: optimize sleep hygiene) -Controlled, not assessed -Current treatment  Zoldipem 5 mg once daily  -Reviewed side effects - no problems noted -Recommended to continue current medication Further review at 2 month f/u              Willa Frater, PharmD Clinical Pharmacist  [facility] [phone]

## 2023-05-29 ENCOUNTER — Other Ambulatory Visit: Payer: Self-pay | Admitting: Family Medicine

## 2023-05-31 ENCOUNTER — Ambulatory Visit: Payer: Medicare Other | Admitting: Podiatry

## 2023-06-04 ENCOUNTER — Ambulatory Visit: Payer: Medicare Other | Admitting: Podiatry

## 2023-06-04 DIAGNOSIS — M79675 Pain in left toe(s): Secondary | ICD-10-CM | POA: Diagnosis not present

## 2023-06-04 DIAGNOSIS — M79674 Pain in right toe(s): Secondary | ICD-10-CM | POA: Diagnosis not present

## 2023-06-04 DIAGNOSIS — B351 Tinea unguium: Secondary | ICD-10-CM

## 2023-06-04 NOTE — Progress Notes (Signed)
        Subjective:  Patient ID: William Brewer, male    DOB: Mar 05, 1946,  MRN: [id number]  William Brewer presents to clinic today for:  Chief Complaint  Patient presents with   foot care    Diabetic foot care- pt wants nail trim and foot checked  . Patient notes nails are thick and elongated, causing pain in shoe gear when ambulating.  Patient has previously diagnosed peripheral neuropathy to both feet and legs.  He states that he is numb to his knees.  He currently takes Lyrica 75 mg 3 times daily.  States that the previous gabapentin had become ineffective.  PCP is Shelva Majestic, MD.  No Known Allergies  Review of Systems: Negative except as noted in the HPI.  Objective:  There were no vitals filed for this visit.  William Brewer is a pleasant 77 y.o. male in NAD. AAO x 3.  Vascular Examination: Patient has palpable DP pulse, absent PT pulse bilateral.  Delayed capillary refill bilateral toes.  Sparse digital hair bilateral.  Proximal to distal cooling WNL bilateral.    Dermatological Examination: Interspaces are clear with no open lesions noted bilateral.  Nails are 3-68mm thick, with yellowish/brown discoloration, subungual debris and distal onycholysis x10.  There is pain with compression of nails x10.    Neurological Examination: Protective sensation absent to bilateral lower extremity with Semmes Weinstein monofilament..  Musculoskeletal Examination: Muscle strength 5/5 to all LE muscle groups b/l.      Latest Ref Rng & Units 05/01/2023    3:18 PM 10/05/2022    1:29 PM  Hemoglobin A1C  Hemoglobin-A1c 4.6 - 6.5 % 7.4  8.4    Assessment/Plan: 1. Pain due to onychomycosis of toenails of both feet     Mycotic nails x10 were sharply debrided with sterile nail nippers and power debriding burr to decrease bulk and length.  Return in about 3 months (around 09/04/2023) for [facility] / nail trim.   Clerance Lav, DPM, FACFAS [facility]     [address] [place], [place] [address]                Office [phone]  Fax [phone]

## 2023-06-11 DIAGNOSIS — Z08 Encounter for follow-up examination after completed treatment for malignant neoplasm: Secondary | ICD-10-CM | POA: Diagnosis not present

## 2023-06-11 DIAGNOSIS — D225 Melanocytic nevi of trunk: Secondary | ICD-10-CM | POA: Diagnosis not present

## 2023-06-11 DIAGNOSIS — L821 Other seborrheic keratosis: Secondary | ICD-10-CM | POA: Diagnosis not present

## 2023-06-11 DIAGNOSIS — Z85828 Personal history of other malignant neoplasm of skin: Secondary | ICD-10-CM | POA: Diagnosis not present

## 2023-06-11 DIAGNOSIS — L814 Other melanin hyperpigmentation: Secondary | ICD-10-CM | POA: Diagnosis not present

## 2023-06-12 ENCOUNTER — Other Ambulatory Visit: Payer: Self-pay | Admitting: Family Medicine

## 2023-06-20 ENCOUNTER — Other Ambulatory Visit: Payer: Self-pay | Admitting: Family Medicine

## 2023-06-27 ENCOUNTER — Other Ambulatory Visit: Payer: Self-pay | Admitting: Family Medicine

## 2023-08-09 ENCOUNTER — Other Ambulatory Visit: Payer: Self-pay | Admitting: Family Medicine

## 2023-08-14 DIAGNOSIS — E119 Type 2 diabetes mellitus without complications: Secondary | ICD-10-CM | POA: Diagnosis not present

## 2023-08-14 LAB — HM DIABETES EYE EXAM

## 2023-09-03 ENCOUNTER — Ambulatory Visit: Payer: Medicare Other | Admitting: Podiatry

## 2023-09-11 ENCOUNTER — Ambulatory Visit (INDEPENDENT_AMBULATORY_CARE_PROVIDER_SITE_OTHER): Payer: Medicare Other | Admitting: Podiatry

## 2023-09-11 ENCOUNTER — Other Ambulatory Visit: Payer: Self-pay | Admitting: Family Medicine

## 2023-09-11 DIAGNOSIS — B351 Tinea unguium: Secondary | ICD-10-CM

## 2023-09-11 DIAGNOSIS — M79675 Pain in left toe(s): Secondary | ICD-10-CM | POA: Diagnosis not present

## 2023-09-11 DIAGNOSIS — M79674 Pain in right toe(s): Secondary | ICD-10-CM | POA: Diagnosis not present

## 2023-09-11 NOTE — Progress Notes (Signed)
Subjective:  Patient ID: William Brewer, male    DOB: 1946/12/18,  MRN: [id number]  Painful nails, at risk diabetic foot care  77 y.o. male returns with the above complaint. History confirmed with patient.  Doing very well no new ulcerations, the nails are thickened and elongated causing discomfort again.  Objective:  Physical Exam: warm, good capillary refill, normal DP and PT pulses and absent light touch and protective sensation bilateral plantar foot.  Onychomycosis x10 with significant brown discoloration of the left hallux and second toe.  There is no callus buildup formation      Assessment:   1. Pain due to onychomycosis of toenails of both feet       Plan:  Patient was evaluated and treated and all questions answered.  Discussed the etiology and treatment options for the condition in detail with the patient.  Regular debridement of the nails in length and thickness have improved pain and function. Recommended debridement of the nails today. Sharp and mechanical debridement performed of all painful and mycotic nails today. Nails debrided in length and thickness using a nail nipper to level of comfort. Discussed treatment options including appropriate shoe gear. Follow up as needed for painful nails.     Return in about 3 months (around 12/11/2023) for at risk diabetic foot care.

## 2023-09-20 ENCOUNTER — Other Ambulatory Visit: Payer: Self-pay | Admitting: Family Medicine

## 2023-10-05 ENCOUNTER — Encounter: Payer: Self-pay | Admitting: *Deleted

## 2023-10-05 NOTE — Progress Notes (Signed)
This patient is due for the following labs: Urine albumin/creatinine ratio Please consider ordering Thanks!

## 2023-10-11 ENCOUNTER — Other Ambulatory Visit: Payer: Self-pay | Admitting: Family Medicine

## 2023-10-30 ENCOUNTER — Encounter: Payer: Medicare Other | Admitting: Family Medicine

## 2023-11-12 ENCOUNTER — Other Ambulatory Visit: Payer: Self-pay | Admitting: Family

## 2023-11-13 ENCOUNTER — Other Ambulatory Visit: Payer: Self-pay | Admitting: Family

## 2023-11-13 NOTE — Telephone Encounter (Signed)
Pts wife called again and states he is in a lot of pain and needs meds asap. Please call pt back and advise.

## 2023-11-16 ENCOUNTER — Telehealth: Payer: Self-pay | Admitting: Family Medicine

## 2023-11-16 NOTE — Telephone Encounter (Signed)
Prescription Request  Caller states Pharmacy sent refill request 11/13/23 with no response. Patient is out of the medication listed below.  11/16/2023  LOV: 05/01/2023  What is the name of the medication or equipment? JARDIANCE 25 MG TABS tablet   Have you contacted your pharmacy to request a refill? Yes   Which pharmacy would you like this sent to?  [facility] [address] [place], [place] - [address] [place] [address] Phone: [phone] Fax: [phone]    Patient notified that their request is being sent to the clinical staff for review and that they should receive a response within 2 business days.   Please advise at Mobile [phone] (mobile)

## 2023-11-17 ENCOUNTER — Other Ambulatory Visit: Payer: Self-pay | Admitting: Family Medicine

## 2023-11-19 ENCOUNTER — Other Ambulatory Visit: Payer: Self-pay

## 2023-11-19 ENCOUNTER — Other Ambulatory Visit: Payer: Self-pay | Admitting: Family Medicine

## 2023-11-19 MED ORDER — EMPAGLIFLOZIN 25 MG PO TABS: 25.0000 mg | ORAL_TABLET | Freq: Every day | ORAL | 3 refills | Status: DC

## 2023-11-19 NOTE — Telephone Encounter (Signed)
Rx sent to requested pharmacy

## 2023-11-21 NOTE — Telephone Encounter (Signed)
This has been taken care of, can you clear it out for me please?

## 2023-12-11 ENCOUNTER — Encounter: Payer: Self-pay | Admitting: Podiatry

## 2023-12-11 ENCOUNTER — Ambulatory Visit: Payer: Medicare Other | Admitting: Podiatry

## 2023-12-11 DIAGNOSIS — M79674 Pain in right toe(s): Secondary | ICD-10-CM

## 2023-12-11 DIAGNOSIS — M79675 Pain in left toe(s): Secondary | ICD-10-CM | POA: Diagnosis not present

## 2023-12-11 DIAGNOSIS — B351 Tinea unguium: Secondary | ICD-10-CM

## 2023-12-11 NOTE — Progress Notes (Signed)
  Subjective:  Patient ID: William Brewer, male    DOB: 06/10/1946,  MRN: [id number]  Painful nails, at risk diabetic foot care  77 y.o. male returns with the above complaint. History confirmed with patient.  Doing very well no new ulcerations, the nails are thickened and elongated causing discomfort again.  Calluses no longer an issue  Objective:  Physical Exam: warm, good capillary refill, normal DP and PT pulses and absent light touch and protective sensation bilateral plantar foot.  Onychomycosis x10 with significant brown discoloration of the left hallux and second toe.  There is no callus buildup formation      Assessment:   1. Pain due to onychomycosis of toenails of both feet        Plan:  Patient was evaluated and treated and all questions answered.  Discussed the etiology and treatment options for the condition in detail with the patient.  Regular debridement of the nails in length and thickness have improved pain and function. Recommended debridement of the nails today. Sharp and mechanical debridement performed of all painful and mycotic nails today. Nails debrided in length and thickness using a nail nipper to level of comfort. Discussed treatment options including appropriate shoe gear. Follow up as needed for painful nails.  Some iatrogenic bleeding on left hallux nail dressed with triple antibiotic ointment and Band-Aid he will apply daily for the next week     Return in about 3 months (around 03/10/2024) for at risk diabetic foot care.

## 2023-12-17 ENCOUNTER — Other Ambulatory Visit: Payer: Self-pay | Admitting: Family Medicine

## 2023-12-27 ENCOUNTER — Other Ambulatory Visit: Payer: Self-pay | Admitting: Family Medicine

## 2024-01-11 ENCOUNTER — Other Ambulatory Visit: Payer: Self-pay | Admitting: Family Medicine

## 2024-02-20 ENCOUNTER — Other Ambulatory Visit: Payer: Self-pay | Admitting: Family Medicine

## 2024-02-26 ENCOUNTER — Ambulatory Visit (INDEPENDENT_AMBULATORY_CARE_PROVIDER_SITE_OTHER): Payer: Medicare Other | Admitting: Family Medicine

## 2024-02-26 ENCOUNTER — Encounter: Payer: Self-pay | Admitting: Family Medicine

## 2024-02-26 VITALS — BP 108/70 | HR 85 | Temp 97.3°F | Ht 72.0 in | Wt 180.6 lb

## 2024-02-26 DIAGNOSIS — Z0000 Encounter for general adult medical examination without abnormal findings: Secondary | ICD-10-CM

## 2024-02-26 DIAGNOSIS — E1169 Type 2 diabetes mellitus with other specified complication: Secondary | ICD-10-CM | POA: Diagnosis not present

## 2024-02-26 DIAGNOSIS — E785 Hyperlipidemia, unspecified: Secondary | ICD-10-CM | POA: Diagnosis not present

## 2024-02-26 DIAGNOSIS — I10 Essential (primary) hypertension: Secondary | ICD-10-CM

## 2024-02-26 DIAGNOSIS — I700 Atherosclerosis of aorta: Secondary | ICD-10-CM | POA: Diagnosis not present

## 2024-02-26 DIAGNOSIS — E1142 Type 2 diabetes mellitus with diabetic polyneuropathy: Secondary | ICD-10-CM

## 2024-02-26 DIAGNOSIS — E538 Deficiency of other specified B group vitamins: Secondary | ICD-10-CM

## 2024-02-26 DIAGNOSIS — I2510 Atherosclerotic heart disease of native coronary artery without angina pectoris: Secondary | ICD-10-CM

## 2024-02-26 DIAGNOSIS — Z7984 Long term (current) use of oral hypoglycemic drugs: Secondary | ICD-10-CM

## 2024-02-26 DIAGNOSIS — G609 Hereditary and idiopathic neuropathy, unspecified: Secondary | ICD-10-CM

## 2024-02-26 LAB — MICROALBUMIN / CREATININE URINE RATIO
Creatinine,U: 49.6 mg/dL
Microalb Creat Ratio: 14.1 mg/g (ref 0.0–30.0)
Microalb, Ur: <0.7 mg/dL (ref 0.0–1.9)

## 2024-02-26 MED ORDER — ZOLPIDEM TARTRATE 5 MG PO TABS
5.0000 mg | ORAL_TABLET | Freq: Every evening | ORAL | 5 refills | Status: DC | PRN
Start: 2024-02-26 — End: 2024-09-01

## 2024-02-26 NOTE — Patient Instructions (Addendum)
 Ask pharmacy for date of both shingrix and send to [place]- I have the one from 06/06/22  neuropathy poor control- he is going to do the Lyrica 75 mg 4x a day for the next week or two and if that is helpful and he tolerates it we will increase to 100 mg three times daily for convenience - mychart me  Advise dental follow up- last visit 6 years ago  Please stop by lab before you go If you have mychart- we will send your results within 3 business days of [place] receiving them.  If you do not have mychart- we will call you about results within 5 business days of [place] receiving them.  *please also note that you will see labs on mychart as soon as they post. I will later go in and write notes on them- will say "notes from Dr. Durene Cal"   Recommended follow up: Return in about 4 months (around 06/27/2024) for followup or sooner if needed.Schedule b4 you leave.

## 2024-02-26 NOTE — Progress Notes (Signed)
 Phone: [phone]   Subjective:  Patient presents today for their annual physical. Chief complaint-noted.   See problem oriented charting- ROS- full  review of systems was completed and negative  except for: Ongoing issues with neuropathy burning pain and numbness  The following were reviewed and entered/updated in epic: Past Medical History:  Diagnosis Date   Allergy    Basal cell carcinoma    leg   Colon polyps    Diabetes mellitus without complication (HCC)    DIVERTICULOSIS, COLON 05/26/2008   Hyperlipidemia    refused treatment   Hypertension    Peripheral neuropathy    Patient Active Problem List   Diagnosis Date Noted   CAD (coronary artery disease) 11/08/2018    Priority: High   Diabetes mellitus type II, controlled (HCC) 09/23/2013    Priority: High   Idiopathic peripheral neuropathy 01/27/2009    Priority: High   Aortic atherosclerosis (HCC) 06/25/2020    Priority: Medium    Insomnia 11/08/2018    Priority: Medium    Right groin hernia 10/24/2016    Priority: Medium    Essential hypertension 03/26/2008    Priority: Medium    Hyperlipidemia associated with type 2 diabetes mellitus (HCC) 06/21/2007    Priority: Medium    Basal cell carcinoma of skin 10/28/2015    Priority: Low   Fever blister 08/10/2015    Priority: Low   History of colonic polyps 06/21/2007    Priority: Low   Past Surgical History:  Procedure Laterality Date   APPENDECTOMY     COLONOSCOPY     LEFT HEART CATH AND CORONARY ANGIOGRAPHY N/A 03/26/2018   Procedure: LEFT HEART CATH AND CORONARY ANGIOGRAPHY;  Surgeon: Corky Crafts, MD;  Location: [facility] INVASIVE CV LAB;  Service: Cardiovascular;  Laterality: N/A;   POLYPECTOMY     ULTRASOUND GUIDANCE FOR VASCULAR ACCESS  03/26/2018   Procedure: Ultrasound Guidance For Vascular Access;  Surgeon: Corky Crafts, MD;  Location: [facility] INVASIVE CV LAB;  Service: Cardiovascular;;    Family History  Adopted: Yes  Problem Relation Age of  Onset   Deep vein thrombosis Mother        cancer   Cancer Mother        intestine   Heart disease Father 61       CABG, former smoker but not heavy   Alzheimer's disease Maternal Grandmother    Aortic aneurysm Maternal Grandfather    Breast cancer Daughter    Hypertension Neg Hx    Colon cancer Neg Hx     Medications- reviewed and updated Current Outpatient Medications  Medication Sig Dispense Refill   aspirin EC 81 MG tablet Take by mouth daily.     empagliflozin (JARDIANCE) 25 MG TABS tablet Take 1 tablet (25 mg total) by mouth daily before breakfast. 90 tablet 3   lisinopril (ZESTRIL) 20 MG tablet Take 1 tablet by mouth once daily 90 tablet 0   Lysine HCl 1000 MG TABS Take 1,000 mg by mouth daily.     Magnesium 400 MG TABS Take 250 mg by mouth daily.     metFORMIN (GLUCOPHAGE) 1000 MG tablet TAKE 1 TABLET BY MOUTH TWICE DAILY WITH A MEAL 180 tablet 0   pregabalin (LYRICA) 75 MG capsule TAKE 1 CAPSULE BY MOUTH THREE TIMES DAILY 90 capsule 5   rosuvastatin (CRESTOR) 10 MG tablet Take 1 tablet by mouth once daily 90 tablet 0   valACYclovir (VALTREX) 1000 MG tablet TAKE 2 TABLETS BY MOUTH TWICE DAILY  AS NEEDED FOR 1 DAY 20 tablet 0   zolpidem (AMBIEN) 5 MG tablet Take 1 tablet (5 mg total) by mouth at bedtime as needed. for sleep 30 tablet 5   No current facility-administered medications for this visit.    Allergies-reviewed and updated No Known Allergies  Social History   Social History Narrative   Married 1973. 2 children. 2 grandkids. Son in [place]. Daughter 5 minutes away.       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 Oropharynx normal Neck: no thyromegaly CV: RRR no murmurs rubs or gallops Lungs: CTAB no crackles, wheeze, rhonchi Abdomen: soft/nontender/nondistended/normal bowel sounds. No rebound or guarding.  Ext: no edema Skin: warm, dry Neuro: grossly normal, moves all extremities, PERRLA Declines genitourinary or rectal exam   Assessment and Plan  78 y.o. male presenting for annual physical.  Health Maintenance counseling: 1. Anticipatory guidance: Patient counseled regarding regular dental exams -q6 months advised- last visit 6 months ago, eye exams -yearly,  avoiding smoking and second hand smoke , limiting alcohol to 2 beverages per day - very little, no illicit drugs .   2. Risk factor reduction:  Advised patient of need for regular exercise and diet rich and fruits and vegetables to reduce risk of heart attack and stroke.  Exercise- starting to exercise in the morning- was needing cane some- legs, core and arm and now able to set that aside even with balance issues. Going through a yoga series Diet/weight management-weight down 4 pounds from last physical 2 years ago and 3 pounds up  from last visit- tries to eat reasonably well- thinks may have added muscle mass.  Wt Readings from Last 3 Encounters:  02/26/24 180 lb 9.6 oz (81.9 kg)  05/01/23 177 lb 6.4 oz (80.5 kg)  04/04/23 180 lb (81.6 kg)  3. Immunizations/screenings/ancillary studies-Shingrix had 2nd - going to try to find date Immunization History  Administered Date(s) Administered   Fluad Quad(high Dose 65+) 10/16/2019, 12/02/2020   Influenza, High Dose Seasonal PF 10/24/2016, 02/12/2018, 11/08/2018, 10/08/2021, 08/28/2022, 10/04/2023   Influenza,inj,Quad PF,6+ Mos 09/23/2013, 11/27/2014, 10/28/2015   Moderna Covid-19 Fall Seasonal Vaccine 5yrs & older 10/04/2023   PFIZER Comirnaty(Gray Top)Covid-19 Tri-Sucrose Vaccine 09/27/2022   PFIZER(Purple Top)SARS-COV-2 Vaccination 01/30/2020,  02/24/2020, 10/02/2020, 05/31/2021   PNEUMOCOCCAL CONJUGATE-20 09/29/2021   Pfizer Covid-19 Vaccine Bivalent Booster 91yrs & up 10/31/2021   Pneumococcal Conjugate-13 04/29/2015   Pneumococcal Polysaccharide-23 09/20/2012   Respiratory Syncytial Virus Vaccine,Recomb Aduvanted(Arexvy) 11/28/2022   Td 05/26/2008   Tdap 05/26/2008, 01/17/2020   Zoster Recombinant(Shingrix) 06/06/2022   Zoster, Live 09/20/2012  4. Prostate cancer screening-  past age based screening recommendations  Lab Results  Component Value Date   PSA 1.79 09/13/2012   PSA 1.38 07/18/2011   PSA 1.79 08/02/2010   5. Colon cancer screening - September 2017 and will aged out of repeat 6. Skin cancer screening-sees dermatology annually. advised regular sunscreen use. Denies worrisome, changing, or new skin lesions.  7. Smoking associated screening (lung cancer screening, AAA screen 65-75, UA)-never smoker 8. STD screening -only  active with wife  Status of chronic or acute concerns   #CAD-nonobstructive based off CT in 2019 #hyperlipidemia #Aortic atherosclerosis S: Medication: Rosuvastatin 10Mg , aspirin 81 mg - no chest pain or shortness of breath  Lab Results  Component Value Date   CHOL 114 06/02/2022   HDL 37 (L) 06/02/2022   LDLCALC 46 06/02/2022   LDLDIRECT 72.0 02/02/2022   TRIG 267 (H) 06/02/2022   CHOLHDL 3.1 06/02/2022    A/P: CAD nonobstructive - asymptomatic  . Continue current medications  Aortic atherosclerosis (presumed stable)- LDL goal ideally <70 - at goal last visit- continue current medications   # Diabetes S: Medication: Metformin 1000Mg  twice daily, Jardiance 25Mg  CBGs- does not check often- bad with neuropathy Lab Results  Component Value Date   HGBA1C 7.4 (H) 05/01/2023   HGBA1C 8.4 (H) 10/05/2022   HGBA1C 7.9 (H) 06/02/2022   A/P: Improved at last visit but would prefer to get him under 7 if possible-update A1c today and continue current medication for now - he prefers to remain on  current medicines unless a1c over 8  #hypertension S: medication: Lisinopril 20Mg .  BP Readings from Last 3 Encounters:  02/26/24 108/70  05/01/23 122/78  04/04/23 126/72   A/P: stable- continue current medicines  -thinks could improve hydration  #Idiopathic neuropathy predating diabetes S: Medication: Lyrica 75 mg 3 times a day  -Previously gabapentin 600 mg 3 times a day-max dose for his renal function  -We also submitted for hand controls due to worsening neuropathy in his feet- due to hands bothering him opted out of hand controls as well  Today patient reports ongoing worsening symptoms- interested in higher dose  A/P: neuropathy poor control- he is going to do the Lyrica 75 mg 4x a day for the next week or two and if that is helpful and he tolerates it we will increase to 100 mg three times daily for convenience    #Insomnia S: Medication: Ambien 5 mg  belsomra was too costly, trazodone not effective A/P: reasonable control- continue current medications    # B12 deficiency S: Current treatment/medication (oral vs. IM): taking daily vitamin with b12  Lab Results  Component Value Date   VITAMINB12 239 02/02/2022  A/P: relative deficiency- prefer over 400- update levels   Recommended follow up: Return in about 4 months (around 06/27/2024) for followup or sooner if needed.Schedule b4 you leave. Future Appointments  Date Time Provider Department Center  03/11/2024  1:45 PM Edwin Cap, DPM [facility]  04/01/2024  3:40 PM [facility] ANNUAL WELLNESS VISIT 1 [facility]   Lab/Order associations: fasting   ICD-10-CM   1. Preventative health care  Z00.00     2. Controlled type 2 diabetes mellitus with diabetic polyneuropathy, without long-term current use of insulin (HCC)  E11.42 Urine Microalbumin w/creat. ratio    Comprehensive metabolic panel    CBC with Differential/Platelet    Lipid panel    Hemoglobin A1c    3. Coronary artery disease involving native  coronary artery of native heart without angina pectoris  I25.10     4. Hyperlipidemia associated with type 2 diabetes mellitus (HCC)  E11.69 Comprehensive metabolic panel   Z61.0 CBC with Differential/Platelet    Lipid panel    5. Essential hypertension  I10 Comprehensive metabolic panel    CBC with Differential/Platelet    Lipid panel    6. Aortic atherosclerosis (HCC)  I70.0     7. Idiopathic peripheral neuropathy  G60.9  8. B12 deficiency  E53.8 Vitamin B12      Meds ordered this encounter  Medications   zolpidem (AMBIEN) 5 MG tablet    Sig: Take 1 tablet (5 mg total) by mouth at bedtime as needed. for sleep    Dispense:  30 tablet    Refill:  5    Return precautions advised.  Tana Conch, MD

## 2024-02-27 ENCOUNTER — Encounter: Payer: Self-pay | Admitting: Family Medicine

## 2024-02-27 LAB — CBC WITH DIFFERENTIAL/PLATELET
Basophils Absolute: 0 10*3/uL (ref 0.0–0.1)
Basophils Relative: 0.4 % (ref 0.0–3.0)
Eosinophils Absolute: 0.1 10*3/uL (ref 0.0–0.7)
Eosinophils Relative: 1.1 % (ref 0.0–5.0)
HCT: 47 % (ref 39.0–52.0)
Hemoglobin: 15.8 g/dL (ref 13.0–17.0)
Lymphocytes Relative: 23.7 % (ref 12.0–46.0)
Lymphs Abs: 1.5 10*3/uL (ref 0.7–4.0)
MCHC: 33.6 g/dL (ref 30.0–36.0)
MCV: 94.3 fl (ref 78.0–100.0)
Monocytes Absolute: 0.5 10*3/uL (ref 0.1–1.0)
Monocytes Relative: 7.7 % (ref 3.0–12.0)
Neutro Abs: 4.3 10*3/uL (ref 1.4–7.7)
Neutrophils Relative %: 67.1 % (ref 43.0–77.0)
Platelets: 203 10*3/uL (ref 150.0–400.0)
RBC: 4.98 Mil/uL (ref 4.22–5.81)
RDW: 13.7 % (ref 11.5–15.5)
WBC: 6.4 10*3/uL (ref 4.0–10.5)

## 2024-02-27 LAB — COMPREHENSIVE METABOLIC PANEL
ALT: 36 U/L (ref 0–53)
AST: 35 U/L (ref 0–37)
Albumin: 4.9 g/dL (ref 3.5–5.2)
Alkaline Phosphatase: 56 U/L (ref 39–117)
BUN: 24 mg/dL — ABNORMAL HIGH (ref 6–23)
CO2: 23 meq/L (ref 19–32)
Calcium: 9.9 mg/dL (ref 8.4–10.5)
Chloride: 103 meq/L (ref 96–112)
Creatinine, Ser: 1.01 mg/dL (ref 0.40–1.50)
GFR: 71.77 mL/min (ref >60.00)
Glucose, Bld: 193 mg/dL — ABNORMAL HIGH (ref 70–99)
Potassium: 4.1 meq/L (ref 3.5–5.1)
Sodium: 138 meq/L (ref 135–145)
Total Bilirubin: 0.5 mg/dL (ref 0.2–1.2)
Total Protein: 7.9 g/dL (ref 6.0–8.3)

## 2024-02-27 LAB — VITAMIN B12: Vitamin B-12: 474 pg/mL (ref 211–911)

## 2024-02-27 LAB — HEMOGLOBIN A1C: Hgb A1c MFr Bld: 8 % — ABNORMAL HIGH (ref 4.6–6.5)

## 2024-02-27 LAB — LIPID PANEL
Cholesterol: 129 mg/dL (ref 0–200)
HDL: 38.6 mg/dL — ABNORMAL LOW (ref >39.00)
LDL Cholesterol: 38 mg/dL (ref 0–99)
NonHDL: 90.78
Total CHOL/HDL Ratio: 3
Triglycerides: 266 mg/dL — ABNORMAL HIGH (ref 0.0–149.0)
VLDL: 53.2 mg/dL — ABNORMAL HIGH (ref 0.0–40.0)

## 2024-03-11 ENCOUNTER — Ambulatory Visit: Payer: Medicare Other | Admitting: Podiatry

## 2024-03-11 ENCOUNTER — Other Ambulatory Visit: Payer: Self-pay | Admitting: Family Medicine

## 2024-03-11 DIAGNOSIS — M79674 Pain in right toe(s): Secondary | ICD-10-CM

## 2024-03-11 DIAGNOSIS — M79675 Pain in left toe(s): Secondary | ICD-10-CM | POA: Diagnosis not present

## 2024-03-11 DIAGNOSIS — B351 Tinea unguium: Secondary | ICD-10-CM | POA: Diagnosis not present

## 2024-03-16 NOTE — Progress Notes (Signed)
  Subjective:  Patient ID: William Brewer, male    DOB: 05/09/1946,  MRN: [id number]  Painful nails, at risk diabetic foot care  78 y.o. male returns with the above complaint. History confirmed with patient.  Doing very well no new ulcerations, the nails are thickened and elongated causing discomfort again.  Calluses no longer an issue.  A1c has been elevated some at 8%  Objective:  Physical Exam: warm, good capillary refill, normal DP and PT pulses and absent light touch and protective sensation bilateral plantar foot.  Onychomycosis x10 with significant brown discoloration of the left hallux and second toe.  There is no callus buildup formation      Assessment:   1. Pain due to onychomycosis of toenails of both feet        Plan:  Patient was evaluated and treated and all questions answered.  Discussed the etiology and treatment options for the condition in detail with the patient.  Regular debridement of the nails in length and thickness have improved pain and function. Recommended debridement of the nails today. Sharp and mechanical debridement performed of all painful and mycotic nails today. Nails debrided in length and thickness using a nail nipper to level of comfort.   Return in 3 months.     Return in about 3 months (around 06/11/2024) for at risk diabetic foot care.

## 2024-03-21 ENCOUNTER — Other Ambulatory Visit: Payer: Self-pay | Admitting: Family Medicine

## 2024-03-21 MED ORDER — PREGABALIN 100 MG PO CAPS: 100.0000 mg | ORAL_CAPSULE | Freq: Three times a day (TID) | ORAL | 11 refills | Status: DC

## 2024-04-01 ENCOUNTER — Other Ambulatory Visit: Payer: Self-pay | Admitting: Family Medicine

## 2024-04-01 ENCOUNTER — Ambulatory Visit (INDEPENDENT_AMBULATORY_CARE_PROVIDER_SITE_OTHER): Payer: Medicare Other

## 2024-04-01 VITALS — Ht 72.0 in | Wt 180.0 lb

## 2024-04-01 DIAGNOSIS — Z0000 Encounter for general adult medical examination without abnormal findings: Secondary | ICD-10-CM | POA: Diagnosis not present

## 2024-04-01 MED ORDER — PREGABALIN 100 MG PO CAPS: 100.0000 mg | ORAL_CAPSULE | Freq: Three times a day (TID) | ORAL | 3 refills | Status: DC

## 2024-04-01 NOTE — Progress Notes (Signed)
 Subjective:   William Brewer is a 78 y.o. who presents for a Medicare Wellness preventive visit.  Visit Complete: Virtual I connected with  William Brewer on 04/01/24 by a audio enabled telemedicine application and verified that I am speaking with the correct person using two identifiers.  Patient Location: Home  Provider Location: Office/Clinic  I discussed the limitations of evaluation and management by telemedicine. The patient expressed understanding and agreed to proceed.  Vital Signs: Because this visit was a virtual/telehealth visit, some criteria may be missing or patient reported. Any vitals not documented were not able to be obtained and vitals that have been documented are patient reported.  VideoDeclined- This patient declined Librarian, academic. Therefore the visit was completed with audio only.  Persons Participating in Visit: Patient.  AWV Questionnaire: No: Patient Medicare AWV questionnaire was not completed prior to this visit.  Cardiac Risk Factors include: advanced age (>65men, >85 women);diabetes mellitus;dyslipidemia;male gender;hypertension     Objective:    Today's Vitals   04/01/24 1533  Weight: 180 lb (81.6 kg)  Height: 6' (1.829 m)  PainSc: 6    Body mass index is 24.41 kg/m.     04/01/2024    3:40 PM 03/27/2023    3:44 PM 03/17/2022   11:37 AM 02/25/2021    1:59 PM 06/23/2020    3:56 PM 01/15/2020    2:53 PM 01/08/2019    3:22 PM  Advanced Directives  Does Patient Have a Medical Advance Directive? Yes Yes Yes No No No No  Type of Estate agent of [place];Living will Healthcare Power of [place];Living will Healthcare Power of Attorney      Copy of Healthcare Power of Attorney in Chart? No - copy requested No - copy requested No - copy requested      Would patient like information on creating a medical advance directive?    No - Patient declined  Yes (MAU/Ambulatory/Procedural Areas - Information  given) Yes (MAU/Ambulatory/Procedural Areas - Information given)    Current Medications (verified) Outpatient Encounter Medications as of 04/01/2024  Medication Sig   aspirin EC 81 MG tablet Take by mouth daily.   empagliflozin (JARDIANCE) 25 MG TABS tablet Take 1 tablet (25 mg total) by mouth daily before breakfast.   lisinopril (ZESTRIL) 20 MG tablet Take 1 tablet by mouth once daily   Lysine HCl 1000 MG TABS Take 1,000 mg by mouth daily.   Magnesium 400 MG TABS Take 250 mg by mouth daily.   metFORMIN (GLUCOPHAGE) 1000 MG tablet TAKE 1 TABLET BY MOUTH TWICE DAILY WITH A MEAL   pregabalin (LYRICA) 100 MG capsule Take 1 capsule (100 mg total) by mouth 3 (three) times daily.   rosuvastatin (CRESTOR) 10 MG tablet Take 1 tablet by mouth once daily   valACYclovir (VALTREX) 1000 MG tablet TAKE 2 TABLETS BY MOUTH TWICE DAILY AS NEEDED FOR 1 DAY   zolpidem (AMBIEN) 5 MG tablet Take 1 tablet (5 mg total) by mouth at bedtime as needed. for sleep   No facility-administered encounter medications on file as of 04/01/2024.    Allergies (verified) Patient has no known allergies.   History: Past Medical History:  Diagnosis Date   Allergy    Basal cell carcinoma    leg   Colon polyps    Diabetes mellitus without complication (HCC)    DIVERTICULOSIS, COLON 05/26/2008   Hyperlipidemia    refused treatment   Hypertension    Peripheral neuropathy  Past Surgical History:  Procedure Laterality Date   APPENDECTOMY     COLONOSCOPY     LEFT HEART CATH AND CORONARY ANGIOGRAPHY N/A 03/26/2018   Procedure: LEFT HEART CATH AND CORONARY ANGIOGRAPHY;  Surgeon: Corky Crafts, MD;  Location: [facility] INVASIVE CV LAB;  Service: Cardiovascular;  Laterality: N/A;   POLYPECTOMY     ULTRASOUND GUIDANCE FOR VASCULAR ACCESS  03/26/2018   Procedure: Ultrasound Guidance For Vascular Access;  Surgeon: Corky Crafts, MD;  Location: [facility] INVASIVE CV LAB;  Service: Cardiovascular;;   Family History  Adopted: Yes   Problem Relation Age of Onset   Deep vein thrombosis Mother        cancer   Cancer Mother        intestine   Heart disease Father 82       CABG, former smoker but not heavy   Alzheimer's disease Maternal Grandmother    Aortic aneurysm Maternal Grandfather    Breast cancer Daughter    Hypertension Neg Hx    Colon cancer Neg Hx    Social History   Socioeconomic History   Marital status: Married    Spouse name: Not on file   Number of children: Not on file   Years of education: Not on file   Highest education level: Not on file  Occupational History   Not on file  Tobacco Use   Smoking status: Never   Smokeless tobacco: Never  Vaping Use   Vaping status: Never Used  Substance and Sexual Activity   Alcohol use: Yes    Alcohol/week: 5.0 - 6.0 standard drinks of alcohol    Types: 3 Cans of beer, 2 - 3 Standard drinks or equivalent per week    Comment: 3 a week    Drug use: No   Sexual activity: Yes  Other Topics Concern   Not on file  Social History Narrative   Married 1973. 2 children (1 living- lost son in 2020). 2 grandkids. Living Daughter.       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, community      Hobbies: support group for peripheral neuropathy quarterly 15-20 people, chess, words with friends, active with [employer] (board of advisors)   Social Drivers of Corporate investment banker Strain: Low Risk  (04/01/2024)   Overall Financial Resource Strain (CARDIA)    Difficulty of Paying Living Expenses: Not hard at all  Food Insecurity: No Food Insecurity (04/01/2024)   Hunger Vital Sign    Worried About Running Out of Food in the Last Year: Never true    Ran Out of Food in the Last Year: Never true  Transportation Needs: No Transportation Needs (04/01/2024)   PRAPARE - Administrator, Civil Service (Medical): No    Lack of Transportation (Non-Medical): No  Physical Activity: Sufficiently Active (04/01/2024)   Exercise  Vital Sign    Days of Exercise per Week: 7 days    Minutes of Exercise per Session: 30 min  Stress: No Stress Concern Present (04/01/2024)   [employer] of Occupational Health - Occupational Stress Questionnaire    Feeling of Stress : Not at all  Social Connections: Moderately Isolated (04/01/2024)   Social Connection and Isolation Panel [NHANES]    Frequency of Communication with Friends and Family: Once a week    Frequency of Social Gatherings with Friends and Family: More than three times a week    Attends Religious Services: Never    Active Member of  Clubs or Organizations: No    Attends Banker Meetings: Never    Marital Status: Married    Tobacco Counseling Counseling given: Not Answered    Clinical Intake:  Pre-visit preparation completed: Yes  Pain : 0-10 Pain Score: 6  Pain Type: Chronic pain Pain Location: Generalized Pain Descriptors / Indicators: Other (Comment) (neuropathy)     BMI - recorded: 24.41 Nutritional Status: BMI of 19-24  Normal Diabetes: Yes CBG done?: No Did pt. bring in CBG monitor from home?: No  Lab Results  Component Value Date   HGBA1C 8.0 (H) 02/26/2024   HGBA1C 7.4 (H) 05/01/2023   HGBA1C 8.4 (H) 10/05/2022     How often do you need to have someone help you when you read instructions, pamphlets, or other written materials from your doctor or pharmacy?: 1 - Never  Interpreter Needed?: No  Information entered by :: Lanier Ensign, LPN   Activities of Daily Living     04/01/2024    3:35 PM  In your present state of health, do you have any difficulty performing the following activities:  Hearing? 0  Vision? 0  Difficulty concentrating or making decisions? 0  Walking or climbing stairs? 0  Dressing or bathing? 0  Doing errands, shopping? 0  Preparing Food and eating ? N  Using the Toilet? N  In the past six months, have you accidently leaked urine? N  Do you have problems with loss of bowel control? N   Managing your Medications? N  Managing your Finances? N  Housekeeping or managing your Housekeeping? N    Patient Care Team: Shelva Majestic, MD as PCP - General (Family Medicine) Lars Masson, MD as PCP - Cardiology (Cardiology) Davina Poke as Consulting Physician (Optometry) Regal, Kirstie Peri, DPM as Consulting Physician (Podiatry) Earlene Plater, Rita Ohara, [facility] (Inactive) (Pharmacist)  Indicate any recent Medical Services you may have received from other than [facility] providers in the past year (date may be approximate).     Assessment:   This is a routine wellness examination for William Brewer.  Hearing/Vision screen Hearing Screening - Comments:: Pt denies any hearing issues  Vision Screening - Comments:: Wears rx glasses - up to date with routine eye exams with Dr Marti Sleigh     Goals Addressed             This Visit's Progress    Patient Stated       Keep weight down and lower sugar        Depression Screen     04/01/2024    3:41 PM 03/27/2023    3:43 PM 03/17/2022   11:36 AM 02/02/2022    2:37 PM 02/25/2021    1:57 PM 07/20/2020   11:22 AM 06/25/2020    9:35 AM  PHQ 2/9 Scores  PHQ - 2 Score 0 0 0 0 0 0 0  PHQ- 9 Score    0  0 0    Fall Risk     04/01/2024    3:43 PM 03/27/2023    3:46 PM 03/17/2022   11:39 AM 02/25/2021    2:00 PM 01/15/2020    2:53 PM  Fall Risk   Falls in the past year? 0 0 0 0 0  Number falls in past yr: 0 0 0 0 0  Injury with Fall? 0 0 0 0 0  Risk for fall due to : Impaired balance/gait Impaired vision;Impaired balance/gait Impaired vision;Impaired balance/gait Impaired vision;Impaired balance/gait   Follow up Falls  prevention discussed Falls prevention discussed Falls prevention discussed Falls prevention discussed Falls evaluation completed;Education provided;Falls prevention discussed    MEDICARE RISK AT HOME:  Medicare Risk at Home Any stairs in or around the home?: Yes If so, are there any without handrails?: No Home free of loose throw rugs in  walkways, pet beds, electrical cords, etc?: Yes Adequate lighting in your home to reduce risk of falls?: Yes Life alert?: No Use of a cane, walker or w/c?: Yes Grab bars in the bathroom?: Yes Shower chair or bench in shower?: No Elevated toilet seat or a handicapped toilet?: No  TIMED UP AND GO:  Was the test performed?  No  Cognitive Function: 6CIT completed    01/08/2019    3:44 PM  MMSE - Mini Mental State Exam  Orientation to time 5  Orientation to Place 5  Registration 3  Attention/ Calculation 5  Recall 3  Language- name 2 objects 2  Language- repeat 1  Language- follow 3 step command 3  Language- read & follow direction 1  Write a sentence 1  Copy design 1  Total score 30        04/01/2024    3:43 PM 03/27/2023    3:48 PM 03/17/2022   11:43 AM 02/25/2021    2:03 PM 01/15/2020    3:04 PM  6CIT Screen  What Year? 0 points 0 points 0 points 0 points 0 points  What month? 0 points 0 points 0 points 0 points 0 points  What time? 0 points 0 points 0 points  0 points  Count back from 20 0 points 0 points 0 points 0 points 0 points  Months in reverse 0 points 0 points 0 points 0 points 0 points  Repeat phrase 0 points 0 points 0 points 0 points 0 points  Total Score 0 points 0 points 0 points  0 points    Immunizations Immunization History  Administered Date(s) Administered   Fluad Quad(high Dose 65+) 10/16/2019, 12/02/2020   Influenza, High Dose Seasonal PF 10/24/2016, 02/12/2018, 11/08/2018, 10/08/2021, 08/28/2022, 10/04/2023   Influenza,inj,Quad PF,6+ Mos 09/23/2013, 11/27/2014, 10/28/2015   Moderna Covid-19 Fall Seasonal Vaccine 51yrs & older 10/04/2023   PFIZER Comirnaty(Gray Top)Covid-19 Tri-Sucrose Vaccine 09/27/2022   PFIZER(Purple Top)SARS-COV-2 Vaccination 01/30/2020, 02/24/2020, 10/02/2020, 05/31/2021   PNEUMOCOCCAL CONJUGATE-20 09/29/2021   Pfizer Covid-19 Vaccine Bivalent Booster 27yrs & up 10/31/2021   Pneumococcal Conjugate-13 04/29/2015   Pneumococcal  Polysaccharide-23 09/20/2012   Respiratory Syncytial Virus Vaccine,Recomb Aduvanted(Arexvy) 11/28/2022   Td 05/26/2008   Tdap 05/26/2008, 01/17/2020   Zoster Recombinant(Shingrix) 03/30/2022, 06/06/2022   Zoster, Live 09/20/2012    Screening Tests Health Maintenance  Topic Date Due   COVID-19 Vaccine (8 - Pfizer risk 2024-25 season) 04/03/2024   FOOT EXAM  06/03/2024   INFLUENZA VACCINE  07/25/2024   OPHTHALMOLOGY EXAM  08/13/2024   HEMOGLOBIN A1C  08/28/2024   Diabetic kidney evaluation - eGFR measurement  02/25/2025   Diabetic kidney evaluation - Urine ACR  02/25/2025   Medicare Annual Wellness (AWV)  04/01/2025   DTaP/Tdap/Td (4 - Td or Tdap) 01/16/2030   Pneumonia Vaccine 15+ Years old  Completed   Hepatitis C Screening  Completed   Zoster Vaccines- Shingrix  Completed   HPV VACCINES  Aged Out   Colonoscopy  Discontinued    Health Maintenance  Health Maintenance Due  Topic Date Due   COVID-19 Vaccine (8 - Pfizer risk 2024-25 season) 04/03/2024   Health Maintenance Items Addressed: See Nurse Notes  Additional Screening:  Vision Screening: Recommended annual ophthalmology exams for early detection of glaucoma and other disorders of the eye.  Dental Screening: Recommended annual dental exams for proper oral hygiene  Community Resource Referral / Chronic Care Management: CRR required this visit?  No   CCM required this visit?  No     Plan:     I have personally reviewed and noted the following in the patient's chart:   Medical and social history Use of alcohol, tobacco or illicit drugs  Current medications and supplements including opioid prescriptions. Patient is not currently taking opioid prescriptions. Functional ability and status Nutritional status Physical activity Advanced directives List of other physicians Hospitalizations, surgeries, and ER visits in previous 12 months Vitals Screenings to include cognitive, depression, and falls Referrals  and appointments  In addition, I have reviewed and discussed with patient certain preventive protocols, quality metrics, and best practice recommendations. A written personalized care plan for preventive services as well as general preventive health recommendations were provided to patient.     Marzella Schlein, LPN   12/30/1094   After Visit Summary: (MyChart) Due to this being a telephonic visit, the after visit summary with patients personalized plan was offered to patient via MyChart   Notes: Nothing significant to report at this time.

## 2024-04-01 NOTE — Telephone Encounter (Signed)
 Copied from CRM [id number]. Topic: Clinical - Medication Refill >> Apr 01, 2024  3:54 PM Pascal Lux wrote: Most Recent Primary Care Visit:  Provider: Shelva Majestic  Department: [facility]  Visit Type: PHYSICAL  Date: 02/26/2024  Medication: pregabalin (LYRICA) 100 MG capsule [[id number]]  Has the patient contacted their pharmacy? Yes (Agent: If no, request that the patient contact the pharmacy for the refill. If patient does not wish to contact the pharmacy document the reason why and proceed with request.) (Agent: If yes, when and what did the pharmacy advise?) Stated Pharmacy said they never received the prescription.  Is this the correct pharmacy for this prescription? Yes If no, delete pharmacy and type the correct one.  This is the patient's preferred pharmacy:  [facility] [address] [place] - [address] [place] [address] Phone: [phone] Fax: [phone]   Has the prescription been filled recently? Yes  Is the patient out of the medication? Yes  Has the patient been seen for an appointment in the last year OR does the patient have an upcoming appointment? Yes  Can we respond through MyChart? No  Agent: Please be advised that Rx refills may take up to 3 business days. We ask that you follow-up with your pharmacy.

## 2024-04-01 NOTE — Patient Instructions (Signed)
 [name] , Thank you for taking time to come for your Medicare Wellness Visit. I appreciate your ongoing commitment to your health goals. Please review the following plan we discussed and let me know if I can assist you in the future.   Referrals/Orders/Follow-Ups/Clinician Recommendations: continue to work on losing weight and lowering Blood sugar levels   This is a list of the screening recommended for you and due dates:  Health Maintenance  Topic Date Due   COVID-19 Vaccine (8 - Pfizer risk 2024-25 season) 04/03/2024   Complete foot exam   06/03/2024   Flu Shot  07/25/2024   Eye exam for diabetics  08/13/2024   Hemoglobin A1C  08/28/2024   Yearly kidney function blood test for diabetes  02/25/2025   Yearly kidney health urinalysis for diabetes  02/25/2025   Medicare Annual Wellness Visit  04/01/2025   DTaP/Tdap/Td vaccine (4 - Td or Tdap) 01/16/2030   Pneumonia Vaccine  Completed   Hepatitis C Screening  Completed   Zoster (Shingles) Vaccine  Completed   HPV Vaccine  Aged Out   Colon Cancer Screening  Discontinued    Advanced directives: (Copy Requested) Please bring a copy of your health care power of attorney and living will to the office to be added to your chart at your convenience. You can mail to [facility] [address] [place], [place] [address] or email to [email or web address]  Next Medicare Annual Wellness Visit scheduled for next year: Yes

## 2024-04-08 ENCOUNTER — Other Ambulatory Visit: Payer: Self-pay | Admitting: Family Medicine

## 2024-06-10 DIAGNOSIS — Z85828 Personal history of other malignant neoplasm of skin: Secondary | ICD-10-CM | POA: Diagnosis not present

## 2024-06-10 DIAGNOSIS — D225 Melanocytic nevi of trunk: Secondary | ICD-10-CM | POA: Diagnosis not present

## 2024-06-10 DIAGNOSIS — Z08 Encounter for follow-up examination after completed treatment for malignant neoplasm: Secondary | ICD-10-CM | POA: Diagnosis not present

## 2024-06-10 DIAGNOSIS — L814 Other melanin hyperpigmentation: Secondary | ICD-10-CM | POA: Diagnosis not present

## 2024-06-10 DIAGNOSIS — L821 Other seborrheic keratosis: Secondary | ICD-10-CM | POA: Diagnosis not present

## 2024-06-17 ENCOUNTER — Ambulatory Visit: Admitting: Podiatry

## 2024-06-24 ENCOUNTER — Other Ambulatory Visit: Payer: Self-pay | Admitting: Family Medicine

## 2024-07-01 ENCOUNTER — Ambulatory Visit: Admitting: Family Medicine

## 2024-07-08 ENCOUNTER — Ambulatory Visit: Admitting: Podiatry

## 2024-07-09 ENCOUNTER — Other Ambulatory Visit: Payer: Self-pay | Admitting: Family Medicine

## 2024-07-25 ENCOUNTER — Other Ambulatory Visit: Payer: Self-pay | Admitting: Family Medicine

## 2024-07-25 NOTE — Telephone Encounter (Signed)
 Last visit 02/26/2024 Last refill 04/01/2024 #90 Refill 3

## 2024-07-31 ENCOUNTER — Ambulatory Visit: Admitting: Podiatry

## 2024-07-31 VITALS — Ht 72.0 in | Wt 180.0 lb

## 2024-07-31 DIAGNOSIS — M79674 Pain in right toe(s): Secondary | ICD-10-CM | POA: Diagnosis not present

## 2024-07-31 DIAGNOSIS — M79675 Pain in left toe(s): Secondary | ICD-10-CM

## 2024-07-31 DIAGNOSIS — B351 Tinea unguium: Secondary | ICD-10-CM

## 2024-08-03 NOTE — Progress Notes (Signed)
  Subjective:  Patient ID: William Brewer, male    DOB: 16-Mar-1946,  MRN: [id number]  Painful nails, at risk diabetic foot care  78 y.o. male returns with the above complaint. History confirmed with patient.  Overall doing well, nails are quite long because he had to reschedule    Objective:  Physical Exam: warm, good capillary refill, normal DP and PT pulses and absent light touch and protective sensation bilateral plantar foot.  Onychomycosis x10 with significant brown discoloration of the left hallux and second toe.  There is no callus buildup formation      Assessment:   1. Pain due to onychomycosis of toenails of both feet         Plan:  Patient was evaluated and treated and all questions answered.  Discussed the etiology and treatment options for the condition in detail with the patient.  Regular debridement of the nails in length and thickness have improved pain and function. Recommended debridement of the nails today. Sharp and mechanical debridement performed of all painful and mycotic nails today. Nails debrided in length and thickness using a nail nipper to level of comfort.   Return in 3 months.     Return in about 3 months (around 10/31/2024) for at risk diabetic foot care.

## 2024-08-04 ENCOUNTER — Ambulatory Visit: Admitting: Family Medicine

## 2024-08-19 ENCOUNTER — Other Ambulatory Visit: Payer: Self-pay | Admitting: Family Medicine

## 2024-08-26 ENCOUNTER — Ambulatory Visit: Admitting: Family Medicine

## 2024-09-01 ENCOUNTER — Other Ambulatory Visit: Payer: Self-pay | Admitting: Family Medicine

## 2024-09-10 LAB — OPHTHALMOLOGY REPORT-SCANNED

## 2024-09-25 ENCOUNTER — Other Ambulatory Visit: Payer: Self-pay | Admitting: Family Medicine

## 2024-09-26 ENCOUNTER — Ambulatory Visit: Admitting: Family Medicine

## 2024-10-06 ENCOUNTER — Ambulatory Visit: Payer: Self-pay

## 2024-10-06 ENCOUNTER — Ambulatory Visit: Admitting: Family Medicine

## 2024-10-06 ENCOUNTER — Encounter: Payer: Self-pay | Admitting: Family Medicine

## 2024-10-06 VITALS — BP 144/70 | HR 57 | Temp 97.7°F | Ht 72.0 in | Wt 184.4 lb

## 2024-10-06 DIAGNOSIS — E119 Type 2 diabetes mellitus without complications: Secondary | ICD-10-CM | POA: Diagnosis not present

## 2024-10-06 DIAGNOSIS — G609 Hereditary and idiopathic neuropathy, unspecified: Secondary | ICD-10-CM

## 2024-10-06 DIAGNOSIS — H8110 Benign paroxysmal vertigo, unspecified ear: Secondary | ICD-10-CM

## 2024-10-06 DIAGNOSIS — I10 Essential (primary) hypertension: Secondary | ICD-10-CM

## 2024-10-06 MED ORDER — MECLIZINE HCL 25 MG PO TABS: 25.0000 mg | ORAL_TABLET | Freq: Three times a day (TID) | ORAL | 0 refills | Status: AC | PRN

## 2024-10-06 NOTE — Patient Instructions (Signed)
 Please follow up if symptoms do not improve or as needed.    Benign Positional Vertigo Vertigo is the feeling that you or your surroundings are moving when they are not. Benign positional vertigo is the most common form of vertigo. This is usually a harmless condition (benign). This condition is positional. This means that symptoms are triggered by certain movements and positions. This condition can be dangerous if it occurs while you are doing something that could cause harm to yourself or others. This includes activities such as driving or operating machinery. What are the causes? The inner ear has fluid-filled canals that help your brain sense movement and balance. When the fluid moves, the brain receives messages about your body's position. With benign positional vertigo, calcium  crystals in the inner ear break free and disturb the inner ear area. This causes your brain to receive confusing messages about your body's position. What increases the risk? You are more likely to develop this condition if: You are a woman. You are 63 years of age or older. You have recently had a head injury. You have an inner ear disease. What are the signs or symptoms? Symptoms of this condition usually happen when you move your head or your eyes in different directions. Symptoms may start suddenly and usually last for less than a minute. They include: Loss of balance and falling. Feeling like you are spinning or moving. Feeling like your surroundings are spinning or moving. Nausea and vomiting. Blurred vision. Dizziness. Involuntary eye movement (nystagmus). Symptoms can be mild and cause only minor problems, or they can be severe and interfere with daily life. Episodes of benign positional vertigo may return (recur) over time. Symptoms may also improve over time. How is this diagnosed? This condition may be diagnosed based on: Your medical history. A physical exam of the head, neck, and ears. Positional  tests to check for or stimulate vertigo. You may be asked to turn your head and change positions, such as going from sitting to lying down. A health care provider will watch for symptoms of vertigo. You may be referred to a health care provider who specializes in ear, nose, and throat problems (ENT or otolaryngologist) or a provider who specializes in disorders of the nervous system (neurologist). How is this treated?  This condition may be treated in a session in which your health care provider moves your head in specific positions to help the displaced crystals in your inner ear move. Treatment for this condition may take several sessions. Surgery may be needed in severe cases, but this is rare. In some cases, benign positional vertigo may resolve on its own in 2-4 weeks. Follow these instructions at home: Safety Move slowly. Avoid sudden body or head movements or certain positions, as told by your health care provider. Avoid driving or operating machinery until your health care provider says it is safe. Avoid doing any tasks that would be dangerous to you or others if vertigo occurs. If you have trouble walking or keeping your balance, try using a cane for stability. If you feel dizzy or unstable, sit down right away. Return to your normal activities as told by your health care provider. Ask your health care provider what activities are safe for you. General instructions Take over-the-counter and prescription medicines only as told by your health care provider. Drink enough fluid to keep your urine pale yellow. Keep all follow-up visits. This is important. Contact a health care provider if: You have a fever. Your condition gets  worse or you develop new symptoms. Your family or friends notice any behavioral changes. You have nausea or vomiting that gets worse. You have numbness or a prickling and tingling sensation. Get help right away if you: Have difficulty speaking or moving. Are always  dizzy or faint. Develop severe headaches. Have weakness in your legs or arms. Have changes in your hearing or vision. Develop a stiff neck. Develop sensitivity to light. These symptoms may represent a serious problem that is an emergency. Do not wait to see if the symptoms will go away. Get medical help right away. Call your local emergency services (911 in the [place] Do not drive yourself to the hospital. Summary Vertigo is the feeling that you or your surroundings are moving when they are not. Benign positional vertigo is the most common form of vertigo. This condition is caused by calcium  crystals in the inner ear that become displaced. This causes a disturbance in an area of the inner ear that helps your brain sense movement and balance. Symptoms include loss of balance and falling, feeling that you or your surroundings are moving, nausea and vomiting, and blurred vision. This condition can be diagnosed based on symptoms, a physical exam, and positional tests. Follow safety instructions as told by your health care provider and keep all follow-up visits. This is important. This information is not intended to replace advice given to you by your health care provider. Make sure you discuss any questions you have with your health care provider. Document Revised: 07/02/2023 Document Reviewed: 07/02/2023 Elsevier Patient Education  2024 [employer].

## 2024-10-06 NOTE — Telephone Encounter (Signed)
 FYI Only or Action Required?: FYI only for provider.  Patient was last seen in primary care on 02/26/2024 by Katrinka Garnette KIDD, MD.  Called Nurse Triage reporting Dizziness.  Symptoms began several days ago.  Interventions attempted: Rest, hydration, or home remedies.  Symptoms are: rapidly improving.  Triage Disposition: See Physician Within 24 Hours  Patient/caregiver understands and will follow disposition?: Yes  Reason for Disposition  [1] NO dizziness now AND [2] age > 13  Answer Assessment - Initial Assessment Questions Pt reports hx of periph neuropathy with chronic dizziness. States he feels like symptoms are a little worse and that he suspects an ear infection. Denies ear pain. Was dizzy yesterday, denies dizziness today.  1. DESCRIPTION: Describe your dizziness.     Lightheaded and dizzy, denies now  2. VERTIGO: Do you feel like either you or the room is spinning or tilting?      Yes, but denies now  3. LIGHTHEADED: Do you feel lightheaded? (e.g., somewhat faint, woozy, weak upon standing)     Yes, but denies now  4. SEVERITY: How bad is it?  Can you walk?     Resolved  5. ONSET:  When did the dizziness begin?     10/04/24-10/05/24  Protocols used: Dizziness - Vertigo-A-AH

## 2024-10-06 NOTE — Telephone Encounter (Signed)
 Copied from CRM [id number]. Topic: Clinical - Medication Question >> Oct 06, 2024  9:13 AM Amy B wrote: Reason for CRM: Inner ear problems with balance issues, dizziness.  He would like an antibiotic.n  He believes he has an ear infection.

## 2024-10-06 NOTE — Progress Notes (Signed)
 Subjective  CC:  Chief Complaint  Patient presents with   Dizziness    Pt stated that he started getting some dizzy spells on Saturday and then it got worse on  Sunday. He is still a little dizzy but not as bad   Same day acute visit; PCP not available. New pt to me. Chart reviewed.   HPI: William Brewer is a 78 y.o. male who presents to the office today to address the problems listed above in the chief complaint. Discussed the use of AI scribe software for clinical note transcription with the patient, who gave verbal consent to proceed.  History of Present Illness William Brewer is a 78 year old male with peripheral neuropathy who presents with severe dizziness.  Vertigo and dizziness - Severe dizziness with sudden onset on Saturday, following two teeth extractions on the previous Wednesday - Described as the whole room spinning, worsened on Sunday to the point that he could only manage it by sitting still - Improved by today, but persistent mild symptoms remain - No double vision, slurred speech, chest pain, palpitations, or significant nausea, though vague nausea occurred at one point - Ocular symptoms included 'jumping' of the eyes during the severe dizziness  Otologic symptoms - No ear pain - Sensation of hardness in the ear - History of ear infections and prior episodes of dizziness, though not as severe as this episode  Peripheral neuropathy and balance impairment - Peripheral neuropathy affecting all four limbs, resulting in chronic balance impairment - Balance issues are ongoing and at baseline represent the best recovery he can expect - Normal motor function in arms and legs despite sensory deficits  Diabetes mellitus type 2 - Type 2 diabetes managed without insulin - Recent A1c approximately 7.5 - Desires improved glycemic control through dietary changes   Assessment  1. Benign paroxysmal positional vertigo, unspecified laterality   2. Essential  hypertension   3. Diabetes mellitus type II, controlled (HCC)   4. Idiopathic peripheral neuropathy      Plan  Assessment and Plan Assessment & Plan Benign paroxysmal positional vertigo vs acute labyrinthitis; normal exam today Acute onset of severe dizziness with room spinning sensation, exacerbated by head movement, consistent with benign paroxysmal positional vertigo vs labyrinthitis. Symptoms have improved significantly since onset. No ear pain or infection. Differential diagnosis included inner ear infection, but examination and history are more consistent with benign positional vertigo. - Prescribe meclizine for severe dizziness as needed, noting potential drowsiness as a side effect. - Advise that dizziness may recur and to monitor symptoms.  Elevated blood pressure in office today.  May be related to recent vertigo.  Will follow-up with primary care  Type 2 diabetes mellitus Type 2 diabetes mellitus with current A1c approximately 7.5. Emphasized the importance of dietary control to manage blood sugar levels. No current insulin use.  Peripheral neuropathy Chronic peripheral neuropathy affecting all four limbs, primarily sensory nerves. Motor function remains intact. Balance issues are present but not exacerbated by neuropathy.    Follow up: prn No orders of the defined types were placed in this encounter.  Meds ordered this encounter  Medications   meclizine (ANTIVERT) 25 MG tablet    Sig: Take 1 tablet (25 mg total) by mouth 3 (three) times daily as needed for dizziness.    Dispense:  20 tablet    Refill:  0     I reviewed the patients updated PMH, FH, and SocHx.  Patient Active Problem List  Diagnosis Date Noted   Aortic atherosclerosis 06/25/2020   Insomnia 11/08/2018   CAD (coronary artery disease) 11/08/2018   Right groin hernia 10/24/2016   Basal cell carcinoma of skin 10/28/2015   Fever blister 08/10/2015   Diabetes mellitus type II, controlled (HCC)  09/23/2013   Idiopathic peripheral neuropathy 01/27/2009   Essential hypertension 03/26/2008   Hyperlipidemia associated with type 2 diabetes mellitus (HCC) 06/21/2007   History of colonic polyps 06/21/2007   Current Meds  Medication Sig   aspirin  EC 81 MG tablet Take by mouth daily.   empagliflozin  (JARDIANCE ) 25 MG TABS tablet Take 1 tablet (25 mg total) by mouth daily before breakfast.   lisinopril  (ZESTRIL ) 20 MG tablet Take 1 tablet by mouth once daily   Lysine HCl 1000 MG TABS Take 1,000 mg by mouth daily.   Magnesium 400 MG TABS Take 250 mg by mouth daily.   meclizine (ANTIVERT) 25 MG tablet Take 1 tablet (25 mg total) by mouth 3 (three) times daily as needed for dizziness.   metFORMIN  (GLUCOPHAGE ) 1000 MG tablet TAKE 1 TABLET BY MOUTH TWICE DAILY WITH A MEAL   pregabalin  (LYRICA ) 100 MG capsule TAKE 1 CAPSULE BY MOUTH THREE TIMES DAILY   rosuvastatin  (CRESTOR ) 10 MG tablet Take 1 tablet by mouth once daily   valACYclovir  (VALTREX ) 1000 MG tablet TAKE 2 TABLETS BY MOUTH TWICE DAILY AS NEEDED FOR 1 DAY   zolpidem  (AMBIEN ) 5 MG tablet TAKE 1 TABLET BY MOUTH AT BEDTIME AS NEEDED FOR SLEEP   Allergies: Patient has no known allergies. Family History: Patient family history includes Alzheimer's disease in his maternal grandmother; Aortic aneurysm in his maternal grandfather; Breast cancer in his daughter; Cancer in his mother; Deep vein thrombosis in his mother; Heart disease (age of onset: 3) in his father. He was adopted. Social History:  Patient  reports that he has never smoked. He has never used smokeless tobacco. He reports current alcohol use of about 5.0 - 6.0 standard drinks of alcohol per week. He reports that he does not use drugs.  Review of Systems: Constitutional: Negative for fever malaise or anorexia Cardiovascular: negative for chest pain Respiratory: negative for SOB or persistent cough Gastrointestinal: negative for abdominal pain  Objective  Vitals: BP (!)  144/70   Pulse (!) 57   Temp 97.7 F (36.5 C)   Ht 6' (1.829 m)   Wt 184 lb 6.4 oz (83.6 kg)   SpO2 97%   BMI 25.01 kg/m  General: no acute distress , A&Ox3 appears well, moves easily to the exam table HEENT: PEERL, conjunctiva normal, no nystagmus neck is supple, normal TMs bilaterally, oropharynx clear Cardiovascular:  RRR without murmur or gallop.  Respiratory:  Good breath sounds bilaterally, CTAB with normal respiratory effort Skin:  Warm, no rashes Neuro: Cranial nerves II through XII intact, normal gait Commons side effects, risks, benefits, and alternatives for medications and treatment plan prescribed today were discussed, and the patient expressed understanding of the given instructions. Patient is instructed to call or message via MyChart if he/she has any questions or concerns regarding our treatment plan. No barriers to understanding were identified. We discussed Red Flag symptoms and signs in detail. Patient expressed understanding regarding what to do in case of urgent or emergency type symptoms.  Medication list was reconciled, printed and provided to the patient in AVS. Patient instructions and summary information was reviewed with the patient as documented in the AVS. This note was prepared with assistance of Dragon voice recognition  software. Occasional wrong-word or sound-a-like substitutions may have occurred due to the inherent limitations of voice recognition software

## 2024-10-09 ENCOUNTER — Other Ambulatory Visit: Payer: Self-pay | Admitting: Family Medicine

## 2024-10-15 ENCOUNTER — Encounter: Payer: Self-pay | Admitting: Family Medicine

## 2024-10-15 ENCOUNTER — Ambulatory Visit: Admitting: Family Medicine

## 2024-10-15 VITALS — BP 112/78 | HR 88 | Temp 97.3°F | Ht 72.0 in | Wt 183.8 lb

## 2024-10-15 DIAGNOSIS — H8110 Benign paroxysmal vertigo, unspecified ear: Secondary | ICD-10-CM

## 2024-10-15 DIAGNOSIS — E1169 Type 2 diabetes mellitus with other specified complication: Secondary | ICD-10-CM | POA: Diagnosis not present

## 2024-10-15 DIAGNOSIS — I2510 Atherosclerotic heart disease of native coronary artery without angina pectoris: Secondary | ICD-10-CM

## 2024-10-15 DIAGNOSIS — E785 Hyperlipidemia, unspecified: Secondary | ICD-10-CM | POA: Diagnosis not present

## 2024-10-15 DIAGNOSIS — Z7984 Long term (current) use of oral hypoglycemic drugs: Secondary | ICD-10-CM | POA: Diagnosis not present

## 2024-10-15 DIAGNOSIS — I10 Essential (primary) hypertension: Secondary | ICD-10-CM | POA: Diagnosis not present

## 2024-10-15 DIAGNOSIS — R42 Dizziness and giddiness: Secondary | ICD-10-CM

## 2024-10-15 DIAGNOSIS — E119 Type 2 diabetes mellitus without complications: Secondary | ICD-10-CM

## 2024-10-15 LAB — COMPREHENSIVE METABOLIC PANEL WITH GFR
ALT: 22 U/L (ref 0–53)
AST: 19 U/L (ref 0–37)
Albumin: 4.6 g/dL (ref 3.5–5.2)
Alkaline Phosphatase: 48 U/L (ref 39–117)
BUN: 17 mg/dL (ref 6–23)
CO2: 27 meq/L (ref 19–32)
Calcium: 9.7 mg/dL (ref 8.4–10.5)
Chloride: 104 meq/L (ref 96–112)
Creatinine, Ser: 1.06 mg/dL (ref 0.40–1.50)
GFR: 67.43 mL/min (ref >60.00)
Glucose, Bld: 165 mg/dL — ABNORMAL HIGH (ref 70–99)
Potassium: 3.9 meq/L (ref 3.5–5.1)
Sodium: 144 meq/L (ref 135–145)
Total Bilirubin: 0.6 mg/dL (ref 0.2–1.2)
Total Protein: 7.1 g/dL (ref 6.0–8.3)

## 2024-10-15 LAB — CBC WITH DIFFERENTIAL/PLATELET
Basophils Absolute: 0 K/uL (ref 0.0–0.1)
Basophils Relative: 0.4 % (ref 0.0–3.0)
Eosinophils Absolute: 0.1 K/uL (ref 0.0–0.7)
Eosinophils Relative: 1.7 % (ref 0.0–5.0)
HCT: 44.7 % (ref 39.0–52.0)
Hemoglobin: 14.7 g/dL (ref 13.0–17.0)
Lymphocytes Relative: 17 % (ref 12.0–46.0)
Lymphs Abs: 1.1 K/uL (ref 0.7–4.0)
MCHC: 32.9 g/dL (ref 30.0–36.0)
MCV: 91.5 fl (ref 78.0–100.0)
Monocytes Absolute: 0.6 K/uL (ref 0.1–1.0)
Monocytes Relative: 8.9 % (ref 3.0–12.0)
Neutro Abs: 4.6 K/uL (ref 1.4–7.7)
Neutrophils Relative %: 72 % (ref 43.0–77.0)
Platelets: 191 K/uL (ref 150.0–400.0)
RBC: 4.88 Mil/uL (ref 4.22–5.81)
RDW: 13.5 % (ref 11.5–15.5)
WBC: 6.3 K/uL (ref 4.0–10.5)

## 2024-10-15 LAB — TSH: TSH: 2.41 u[IU]/mL (ref 0.35–5.50)

## 2024-10-15 LAB — HEMOGLOBIN A1C: Hgb A1c MFr Bld: 8.8 % — ABNORMAL HIGH (ref 4.6–6.5)

## 2024-10-15 NOTE — Progress Notes (Signed)
 Phone [phone] In person visit   Subjective:   William Brewer is a 78 y.o. year old very pleasant male patient who presents for/with See problem oriented charting Chief Complaint  Patient presents with   Diabetes    Pt struggling with neuropathy;    Hypertension   Dental Pain    Pt had two teeth removed and states he doesn't feel as if he has recovered from it because of how much medicine he took; stopped hydrocodone 3 days ago 3-525;    Balance Issues    Patient barely made it from the lobby to the exam room without almost falling twice; dizziness getting worse;     Past Medical History-  Patient Active Problem List   Diagnosis Date Noted   CAD (coronary artery disease) 11/08/2018    Priority: High   Diabetes mellitus type II, controlled (HCC) 09/23/2013    Priority: High   Idiopathic peripheral neuropathy 01/27/2009    Priority: High   Aortic atherosclerosis 06/25/2020    Priority: Medium    Insomnia 11/08/2018    Priority: Medium    Right groin hernia 10/24/2016    Priority: Medium    Essential hypertension 03/26/2008    Priority: Medium    Hyperlipidemia associated with type 2 diabetes mellitus (HCC) 06/21/2007    Priority: Medium    Basal cell carcinoma of skin 10/28/2015    Priority: Low   Fever blister 08/10/2015    Priority: Low   History of colonic polyps 06/21/2007    Priority: Low    Medications- reviewed and updated Current Outpatient Medications  Medication Sig Dispense Refill   aspirin  EC 81 MG tablet Take by mouth daily.     empagliflozin  (JARDIANCE ) 25 MG TABS tablet Take 1 tablet (25 mg total) by mouth daily before breakfast. 90 tablet 3   lisinopril  (ZESTRIL ) 20 MG tablet Take 1 tablet by mouth once daily 90 tablet 0   Lysine HCl 1000 MG TABS Take 1,000 mg by mouth daily.     Magnesium 400 MG TABS Take 250 mg by mouth daily.     meclizine (ANTIVERT) 25 MG tablet Take 1 tablet (25 mg total) by mouth 3 (three) times daily as needed for  dizziness. 20 tablet 0   metFORMIN  (GLUCOPHAGE ) 1000 MG tablet TAKE 1 TABLET BY MOUTH TWICE DAILY WITH A MEAL 180 tablet 0   pregabalin  (LYRICA ) 100 MG capsule TAKE 1 CAPSULE BY MOUTH THREE TIMES DAILY 90 capsule 0   rosuvastatin  (CRESTOR ) 10 MG tablet Take 1 tablet by mouth once daily 90 tablet 0   valACYclovir  (VALTREX ) 1000 MG tablet TAKE 2 TABLETS BY MOUTH TWICE DAILY AS NEEDED FOR 1 DAY 20 tablet 0   zolpidem  (AMBIEN ) 5 MG tablet TAKE 1 TABLET BY MOUTH AT BEDTIME AS NEEDED FOR SLEEP 30 tablet 2   No current facility-administered medications for this visit.     Objective:  BP 112/78 (BP Location: Left Arm, Patient Position: Sitting, Cuff Size: Normal)   Pulse 88   Temp (!) 97.3 F (36.3 C) (Temporal)   Ht 6' (1.829 m)   Wt 183 lb 12.8 oz (83.4 kg)   SpO2 95%   BMI 24.93 kg/m  Gen: NAD, resting comfortably CV: RRR no murmurs rubs or gallops Lungs: CTAB no crackles, wheeze, rhonchi Ext: no edema Skin: warm, dry  Neuro: CN II-XII intact, sensation and reflexes normal throughout, 5/5 muscle strength in bilateral upper and lower extremities. Normal finger to nose.  No pronator drift. Romberg  he's not able to fully participate. imbalanced gait leans toward either side to regain traction.     Diabetic foot exam was performed with the following findings:   No deformities, ulcerations, or other skin breakdown Normal sensation of 10g monofilament Intact posterior tibialis and dorsalis pedis pulses Slight decrease on PT pulse compared to baseline         Assessment and Plan   #Balance concerns/dizziness S:  Dental pain-patient had 2 teeth removed but felt like he has not recovered fully due to all the medicine he had to take.  Stopped hydrocodone just 2 days ago but was using 3-4 a day- balance was worse on this.  Lightly sedated for procedure.  -in addition to balance feels he's been dizzy and swimmy headed.  motion issues from the 13th are better but still present- worse at  night. Thought to be BENIGN PAROXYSMAL POSITIONAL VERTIGO (BPPV) by Dr. Jodie last week. Meclizine helps some but feels drowsy. At moment no motion issues- just feels foggy. No chest pain or shortness of breath or palpitations (declines EKG) -some morning congestion. No nausea or vomiting. No sinus pressure. No tinnitus. No hearing loss. Reports there is no urinary symptoms (doubt urinary tract infection (UTI))  -is due to pick up his new eye glass prescription -only mild improvements since coming of hydrocodone A/P:  for BENIGN PAROXYSMAL POSITIONAL VERTIGO (BPPV) suggested vestibular rehab but he declined-wants to try home exercises first-I did not feel confident about trying Dix-Hallpike maneuver today-thought it could worsen his ability to get home  As far as the ongoing dizziness (possibly distinct from the BENIGN PAROXYSMAL POSITIONAL VERTIGO (BPPV)), this could be combination of his medications particularly with taking Lyrica , Ambien  with addition of hydrocodone lately-glad he has stopped hydrocodone and hoping he will gradually improve-also on hold Ambien .  With ongoing dizziness did offer brain MRI but he declines.  Other than imbalance and neuropathy reassuring neurological exam -new or worsening symptoms he should seek care immediately. Stressed importance of fall prevention and my preference for walker over cane  #Idiopathic neuropathy predating diabetes S: Medication: Lyrica  100 mg 3 times a day  -Previously gabapentin  600 mg 3 times a day-max dose for his renal function  -We also submitted for hand controls due to worsening neuropathy in his feet- due to hands bothering him opted out of hand controls as well  Today patient reports With his neuropathy he is continue to struggle.  Feels like he has had worsening balance issues.  Had a hard time making and ending from the lobby. He is wiling to go back to cane and restart his exercises A/P: ongoing worsening pain from neuropathy- wants to go  up on Lyrica  but strongly dissuaded from doing this right now  with how he is feeling -balance is far worse today- suggested wheelchair to get to car but he declines  #CAD-nonobstructive based off CT in 2019 #hyperlipidemia #Aortic atherosclerosis S: Medication: Rosuvastatin  10Mg , aspirin  81 mg - no chest pain or shortness of breath  Lab Results  Component Value Date   CHOL 129 02/26/2024   HDL 38.60 (L) 02/26/2024   LDLCALC 38 02/26/2024   LDLDIRECT 72.0 02/02/2022   TRIG 266.0 (H) 02/26/2024   CHOLHDL 3 02/26/2024   A/P: CAD nonobstructive asymptomatic continue current medications   For lipids - most recent LDL looked excellent - continue current medications    # Diabetes S: Medication: Metformin  1000Mg  twice daily, Jardiance  25Mg . He guesses 8.3 a1c CBGs- does not check -feels  he can improve diet- sweets and carbs Lab Results  Component Value Date   HGBA1C 8.0 (H) 02/26/2024   HGBA1C 7.4 (H) 05/01/2023   HGBA1C 8.4 (H) 10/05/2022   A/P: diabetes above goal but declines medication changes- wants to work on healthy eating and regular exercise  -still not interested in GLP-1 agonist medications  #hypertension S: medication: Lisinopril  20Mg .   BP Readings from Last 3 Encounters:  10/15/24 112/78  10/06/24 (!) 144/70  02/26/24 108/70  A/P: stable- continue current medicines     #Insomnia S: Medication: Ambien  5 mg - advised against using this whiel on hydrocodone or until feels recovers from dental work belsomra  was too costly, trazodone  not effective in past A/P: good control but I want him to pull back for now until head is more clear   Recommended follow up: Return in about 14 weeks (around 01/21/2025) for followup or sooner if needed.Schedule b4 you leave.  Lab/Order associations:   ICD-10-CM   1. Diabetes mellitus type II, controlled (HCC)  E11.9 Comprehensive metabolic panel with GFR    CBC with Differential/Platelet    Hemoglobin A1c    Microalbumin /  creatinine urine ratio    2. Coronary artery disease involving native coronary artery of native heart without angina pectoris  I25.10     3. Essential hypertension  I10     4. Hyperlipidemia associated with type 2 diabetes mellitus (HCC)  E11.69 TSH   E78.5     5. Benign paroxysmal positional vertigo, unspecified laterality  H81.10     6. Dizziness  R42       I personally spent a total of 42 minutes in the care of the patient today including preparing to see the patient, getting/reviewing separately obtained history, performing a medically appropriate exam/evaluation, counseling and educating, and documenting clinical information in the EHR.   Return precautions advised.  Garnette Lukes, MD

## 2024-10-15 NOTE — Patient Instructions (Addendum)
 I suggested wheelchair out but you declined  For balance in general- He is wiling to go back to cane and restart his exercises. Please be careful with position changes in particular between your vertigo AND your neuropathy - that is a tough combo and we must avoid falls  Hold on Ambien  until you feel more clear mentally - if worsening return to see us   For vertigo- discussed some options such as vestibular rehab but you wanted to try video first- if not improving we can refer. Also mentioned possible MRI to rule out stroke but you wanted to hold off Niels Saba, MD Vertigo Treatment Oct 11 [email or web address]  Please stop by lab before you go If you have mychart- we will send your results within 3 business days of us  receiving them.  If you do not have mychart- we will call you about results within 5 business days of us  receiving them.  *please also note that you will see labs on mychart as soon as they post. I will later go in and write notes on them- will say notes from Dr. Katrinka   Recommended follow up: Return in about 14 weeks (around 01/21/2025) for followup or sooner if needed.Schedule b4 you leave.

## 2024-10-17 ENCOUNTER — Other Ambulatory Visit

## 2024-10-17 ENCOUNTER — Ambulatory Visit: Payer: Self-pay | Admitting: Family Medicine

## 2024-10-17 LAB — MICROALBUMIN / CREATININE URINE RATIO
Creatinine,U: 105.2 mg/dL
Microalb Creat Ratio: 8.8 mg/g (ref 0.0–30.0)
Microalb, Ur: 0.9 mg/dL (ref 0.0–1.9)

## 2024-10-22 ENCOUNTER — Other Ambulatory Visit: Payer: Self-pay | Admitting: Family Medicine

## 2024-10-30 ENCOUNTER — Other Ambulatory Visit: Payer: Self-pay | Admitting: Family Medicine

## 2024-11-13 ENCOUNTER — Ambulatory Visit (INDEPENDENT_AMBULATORY_CARE_PROVIDER_SITE_OTHER): Admitting: Podiatry

## 2024-11-13 VITALS — Ht 72.0 in | Wt 183.8 lb

## 2024-11-13 DIAGNOSIS — B351 Tinea unguium: Secondary | ICD-10-CM | POA: Diagnosis not present

## 2024-11-13 DIAGNOSIS — M79674 Pain in right toe(s): Secondary | ICD-10-CM | POA: Diagnosis not present

## 2024-11-13 DIAGNOSIS — M79675 Pain in left toe(s): Secondary | ICD-10-CM

## 2024-11-13 NOTE — Progress Notes (Signed)
  Subjective:  Patient ID: William Brewer, male    DOB: [birth year],  MRN: [id number]  Painful nails, at risk diabetic foot care  78 y.o. male returns with the above complaint. History confirmed with patient.  Overall doing well, no other new issues states his blood sugars well-controlled  Objective:  Physical Exam: warm, good capillary refill, normal DP and PT pulses and absent light touch and protective sensation bilateral plantar foot.  Onychomycosis x10 with significant brown discoloration of the left hallux and second toe.  There is no callus buildup formation      Assessment:   1. Pain due to onychomycosis of toenails of both feet          Plan:  Patient was evaluated and treated and all questions answered.  Discussed the etiology and treatment options for the condition in detail with the patient.  Regular debridement of the nails in length and thickness have improved pain and function. Recommended debridement of the nails today. Sharp and mechanical debridement performed of all painful and mycotic nails today. Nails debrided in length and thickness using a nail nipper to level of comfort.   Return in 3 months.     Return in about 3 months (around 02/13/2025).

## 2024-11-18 ENCOUNTER — Other Ambulatory Visit: Payer: Self-pay | Admitting: Family Medicine

## 2024-12-04 ENCOUNTER — Other Ambulatory Visit: Payer: Self-pay | Admitting: Family Medicine

## 2024-12-05 ENCOUNTER — Other Ambulatory Visit: Payer: Self-pay

## 2024-12-05 NOTE — Progress Notes (Signed)
 Error

## 2024-12-19 ENCOUNTER — Other Ambulatory Visit: Payer: Self-pay | Admitting: Family Medicine

## 2025-01-07 ENCOUNTER — Encounter: Payer: Self-pay | Admitting: Family Medicine

## 2025-01-07 ENCOUNTER — Ambulatory Visit: Admitting: Family Medicine

## 2025-01-07 VITALS — BP 122/70 | Temp 98.1°F | Ht 72.0 in | Wt 179.2 lb

## 2025-01-07 DIAGNOSIS — E785 Hyperlipidemia, unspecified: Secondary | ICD-10-CM | POA: Diagnosis not present

## 2025-01-07 DIAGNOSIS — B351 Tinea unguium: Secondary | ICD-10-CM

## 2025-01-07 DIAGNOSIS — E1140 Type 2 diabetes mellitus with diabetic neuropathy, unspecified: Secondary | ICD-10-CM | POA: Diagnosis not present

## 2025-01-07 DIAGNOSIS — G609 Hereditary and idiopathic neuropathy, unspecified: Secondary | ICD-10-CM | POA: Diagnosis not present

## 2025-01-07 DIAGNOSIS — Z7984 Long term (current) use of oral hypoglycemic drugs: Secondary | ICD-10-CM | POA: Diagnosis not present

## 2025-01-07 DIAGNOSIS — I2510 Atherosclerotic heart disease of native coronary artery without angina pectoris: Secondary | ICD-10-CM | POA: Diagnosis not present

## 2025-01-07 DIAGNOSIS — E1169 Type 2 diabetes mellitus with other specified complication: Secondary | ICD-10-CM

## 2025-01-07 DIAGNOSIS — I10 Essential (primary) hypertension: Secondary | ICD-10-CM

## 2025-01-07 DIAGNOSIS — E119 Type 2 diabetes mellitus without complications: Secondary | ICD-10-CM

## 2025-01-07 DIAGNOSIS — G4700 Insomnia, unspecified: Secondary | ICD-10-CM

## 2025-01-07 NOTE — Progress Notes (Signed)
 " Phone [phone] In person visit   Subjective:   William Brewer is a 79 y.o. year old very pleasant male patient who presents for/with See problem oriented charting Chief Complaint  Patient presents with   Medical Management of Chronic Issues   Diabetes    Past Medical History-  Patient Active Problem List   Diagnosis Date Noted   CAD (coronary artery disease) 11/08/2018    Priority: High   Diabetes mellitus type II, controlled (HCC) 09/23/2013    Priority: High   Idiopathic peripheral neuropathy 01/27/2009    Priority: High   Aortic atherosclerosis 06/25/2020    Priority: Medium    Insomnia 11/08/2018    Priority: Medium    Right groin hernia 10/24/2016    Priority: Medium    Essential hypertension 03/26/2008    Priority: Medium    Hyperlipidemia associated with type 2 diabetes mellitus (HCC) 06/21/2007    Priority: Medium    Basal cell carcinoma of skin 10/28/2015    Priority: Low   Fever blister 08/10/2015    Priority: Low   History of colonic polyps 06/21/2007    Priority: Low    Medications- reviewed and updated Current Outpatient Medications  Medication Sig Dispense Refill   aspirin  EC 81 MG tablet Take by mouth daily.     JARDIANCE  25 MG TABS tablet TAKE 1 TABLET BY MOUTH ONCE DAILY BEFORE BREAKFAST 90 tablet 0   lisinopril  (ZESTRIL ) 20 MG tablet Take 1 tablet by mouth once daily 90 tablet 0   Lysine HCl 1000 MG TABS Take 1,000 mg by mouth daily.     Magnesium 400 MG TABS Take 250 mg by mouth daily.     meclizine  (ANTIVERT ) 25 MG tablet Take 1 tablet (25 mg total) by mouth 3 (three) times daily as needed for dizziness. 20 tablet 0   metFORMIN  (GLUCOPHAGE ) 1000 MG tablet TAKE 1 TABLET BY MOUTH TWICE DAILY WITH A MEAL 180 tablet 0   pregabalin  (LYRICA ) 100 MG capsule TAKE 1 CAPSULE BY MOUTH THREE TIMES DAILY 90 capsule 5   rosuvastatin  (CRESTOR ) 10 MG tablet Take 1 tablet by mouth once daily 90 tablet 0   valACYclovir  (VALTREX ) 1000 MG tablet TAKE 2  TABLETS BY MOUTH TWICE DAILY AS NEEDED FOR 1 DAY 20 tablet 0   zolpidem  (AMBIEN ) 5 MG tablet TAKE 1 TABLET BY MOUTH AT BEDTIME AS NEEDED FOR SLEEP 30 tablet 5   No current facility-administered medications for this visit.     Objective:  BP 122/70 (BP Location: Left Arm, Patient Position: Sitting, Cuff Size: Normal)   Temp 98.1 F (36.7 C) (Temporal)   Ht 6' (1.829 m)   Wt 179 lb 3.2 oz (81.3 kg)   BMI 24.30 kg/m  Gen: NAD, resting comfortably CV: RRR no murmurs rubs or gallops Lungs: CTAB no crackles, wheeze, rhonchi Ext: no edema Skin: warm, dry    Assessment and Plan   #influenza reports right around first of year and positive on home test- he thankfully improved without intervention  #CAD-nonobstructive based off CT in 2019 #hyperlipidemia S: Medication: Rosuvastatin  10Mg , aspirin  81 mg -no chest pain or shortness of breath Lab Results  Component Value Date   CHOL 129 02/26/2024   HDL 38.60 (L) 02/26/2024   LDLCALC 38 02/26/2024   LDLDIRECT 72.0 02/02/2022   TRIG 266.0 (H) 02/26/2024   CHOLHDL 3 02/26/2024    A/P: CAD nonobstructive - asymptomatic continue current medications  For lipids - LDL reasonable control though triglyceride(s) high likely  from diabetes plus his baseline- work on diabetes control   # Diabetes S: Medication: Metformin  1000Mg  twice daily, Jardiance  25Mg  CBGs- does not check Exercise and diet- weight down 4 lbs Lab Results  Component Value Date   HGBA1C 8.8 (H) 10/15/2024   HGBA1C 8.0 (H) 02/26/2024   HGBA1C 7.4 (H) 05/01/2023   A/P: he's hopeful with home changes for improvement- he has reduced sugar intake and will come back to have this rechecked. Already limits carbs  #painful onychomycosis- needs annual referral for ongoing q3 month visits placed otday  #hypertension S: medication: Lisinopril  20Mg .  Lightheadedness better lately BP Readings from Last 3 Encounters:  01/07/25 122/70  10/15/24 112/78  10/06/24 (!) 144/70  A/P:  well controlled continue current medications   #Idiopathic neuropathy predating diabetes S: Medication: Lyrica  100 mg 3 times a day - despite this ongoing gradual worsening with time -Previously gabapentin  600 mg 3 times a day-max dose for his renal function  - one slight fall on carpet in last year but more near falls. Has some canes - encouraged. Leg exercises have helped A/P: tolerates to best of ability- constant pain around 6/10 on average with stabbing pains that can be more severe once quarter- hands and feet. We will continue current medications- he's ok holding steady    #Insomnia S: Medication: Ambien  5 mg  belsomra  was too costly, trazodone  not effective A/P: reasonable control- continue current medications. No driving issues next day on this   Recommended follow up: Return in about 4 months (around 05/07/2025) for followup or sooner if needed.Schedule b4 you leave. Future Appointments  Date Time Provider Department Center  02/10/2025  1:15 PM McDonald, Juliene SAUNDERS, DPM [facility]   Lab/Order associations:   ICD-10-CM   1. Pain due to onychomycosis of nail  B35.1 Ambulatory referral to Podiatry   M79.609     2. Diabetes mellitus type II, controlled (HCC)  E11.9 Comprehensive metabolic panel with GFR    Hemoglobin A1c    3. Hyperlipidemia associated with type 2 diabetes mellitus (HCC)  E11.69    E78.5       No orders of the defined types were placed in this encounter.   Return precautions advised.  Garnette Lukes, MD  "

## 2025-01-07 NOTE — Patient Instructions (Addendum)
 Schedule lab 01/16/1025 or later  No changes today unless labs lead us  to make changes. Hoping for improved a1c!   Recommended follow up: Return in about 4 months (around 05/07/2025) for followup or sooner if needed.Schedule b4 you leave.

## 2025-01-14 ENCOUNTER — Other Ambulatory Visit: Payer: Self-pay | Admitting: Family Medicine

## 2025-01-16 ENCOUNTER — Other Ambulatory Visit

## 2025-01-20 ENCOUNTER — Other Ambulatory Visit

## 2025-02-10 ENCOUNTER — Ambulatory Visit: Admitting: Podiatry

## 2025-05-07 ENCOUNTER — Other Ambulatory Visit

## 2025-05-07 ENCOUNTER — Ambulatory Visit: Admitting: Family Medicine
